# Patient Record
Sex: Female | Born: 1935
Health system: Southern US, Community
[De-identification: ages and names within clinical notes are randomized; demographics above are authoritative.]

## PROBLEM LIST (undated history)

## (undated) DIAGNOSIS — K219 Gastro-esophageal reflux disease without esophagitis: Secondary | ICD-10-CM

## (undated) DIAGNOSIS — E039 Hypothyroidism, unspecified: Secondary | ICD-10-CM

## (undated) DIAGNOSIS — M1990 Unspecified osteoarthritis, unspecified site: Secondary | ICD-10-CM

## (undated) DIAGNOSIS — I499 Cardiac arrhythmia, unspecified: Secondary | ICD-10-CM

## (undated) DIAGNOSIS — C801 Malignant (primary) neoplasm, unspecified: Secondary | ICD-10-CM

## (undated) HISTORY — PX: TOTAL HIP ARTHROPLASTY: SHX124

---

## 2004-10-06 ENCOUNTER — Ambulatory Visit: Payer: Self-pay | Admitting: Family Medicine

## 2004-10-13 ENCOUNTER — Ambulatory Visit: Payer: Self-pay | Admitting: Family Medicine

## 2004-10-15 ENCOUNTER — Ambulatory Visit: Payer: Self-pay | Admitting: Family Medicine

## 2004-10-29 ENCOUNTER — Ambulatory Visit: Payer: Self-pay | Admitting: Family Medicine

## 2005-01-06 ENCOUNTER — Ambulatory Visit: Payer: Self-pay | Admitting: Family Medicine

## 2005-02-24 ENCOUNTER — Ambulatory Visit: Payer: Self-pay | Admitting: Family Medicine

## 2005-07-06 ENCOUNTER — Ambulatory Visit: Payer: Self-pay | Admitting: Family Medicine

## 2005-10-06 ENCOUNTER — Ambulatory Visit: Payer: Self-pay | Admitting: Family Medicine

## 2006-02-10 ENCOUNTER — Ambulatory Visit: Payer: Self-pay | Admitting: Family Medicine

## 2006-06-02 ENCOUNTER — Ambulatory Visit: Payer: Self-pay | Admitting: Family Medicine

## 2006-06-14 ENCOUNTER — Ambulatory Visit: Payer: Self-pay | Admitting: Family Medicine

## 2006-10-08 ENCOUNTER — Ambulatory Visit: Payer: Self-pay | Admitting: Family Medicine

## 2009-04-05 ENCOUNTER — Encounter: Admission: RE | Admit: 2009-04-05 | Discharge: 2009-04-05 | Payer: Self-pay | Admitting: Family Medicine

## 2010-04-05 IMAGING — MG MM BREAST STEREO BIOPSY*L*
2 series · 2 of 2 positions shown · non-contrast
Comparison: none

Addendum Begins

Pathology results reveal microcalcifications in benign breast
lobules and stromal fibrosis in the left breast. This was found to
be concordant by Dr. Jovydas Agra. I spoke with the patient by
telephone to relay her pathology results. She stated that she had
done well after the biopsy without any bleeding, bruising, or
tenderness. Post biopsy instructions were given and the patient was
encouraged to call The [REDACTED] for any
questions or concerns. She was asked to return in 1 year for
screening mammography.
Pathology results were dictated by Thierry Martial Nih RN, BSN on Seraf[place]
Addendum Ends
CLINICAL DATA: Indeterminate microcalcifications left breast

[L CC]
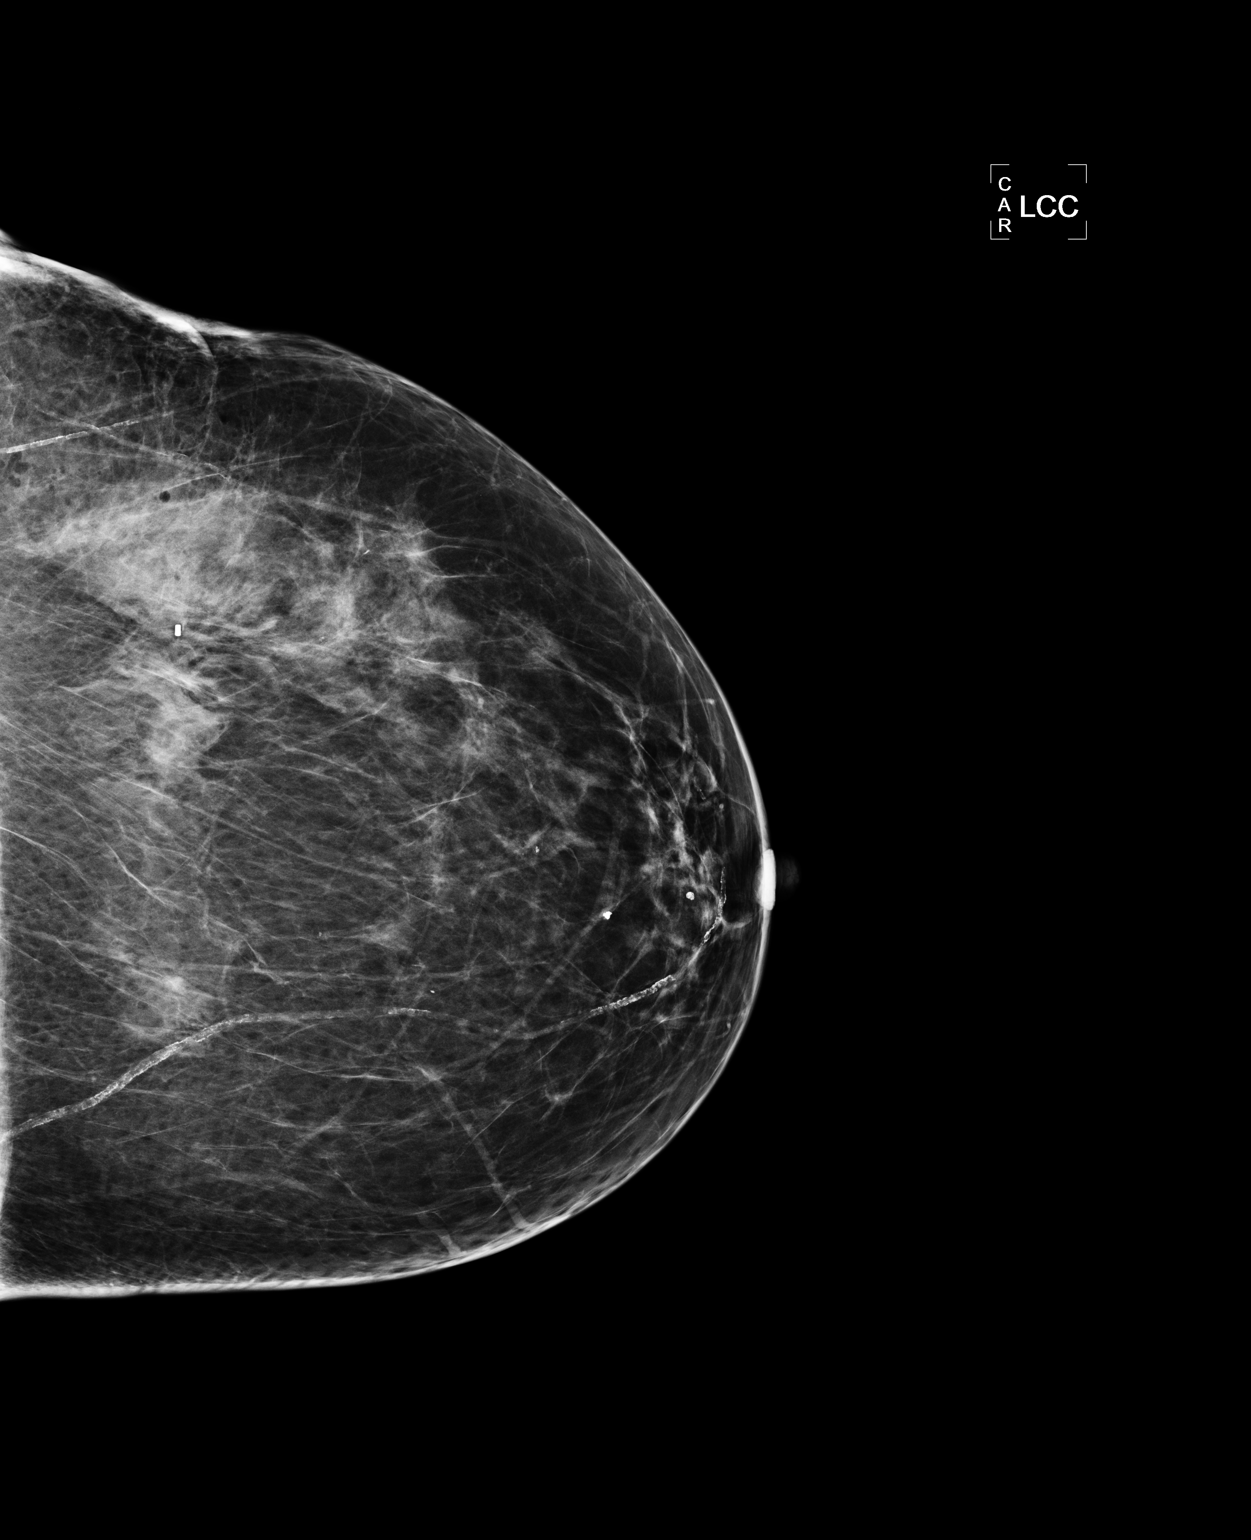

[L ML]
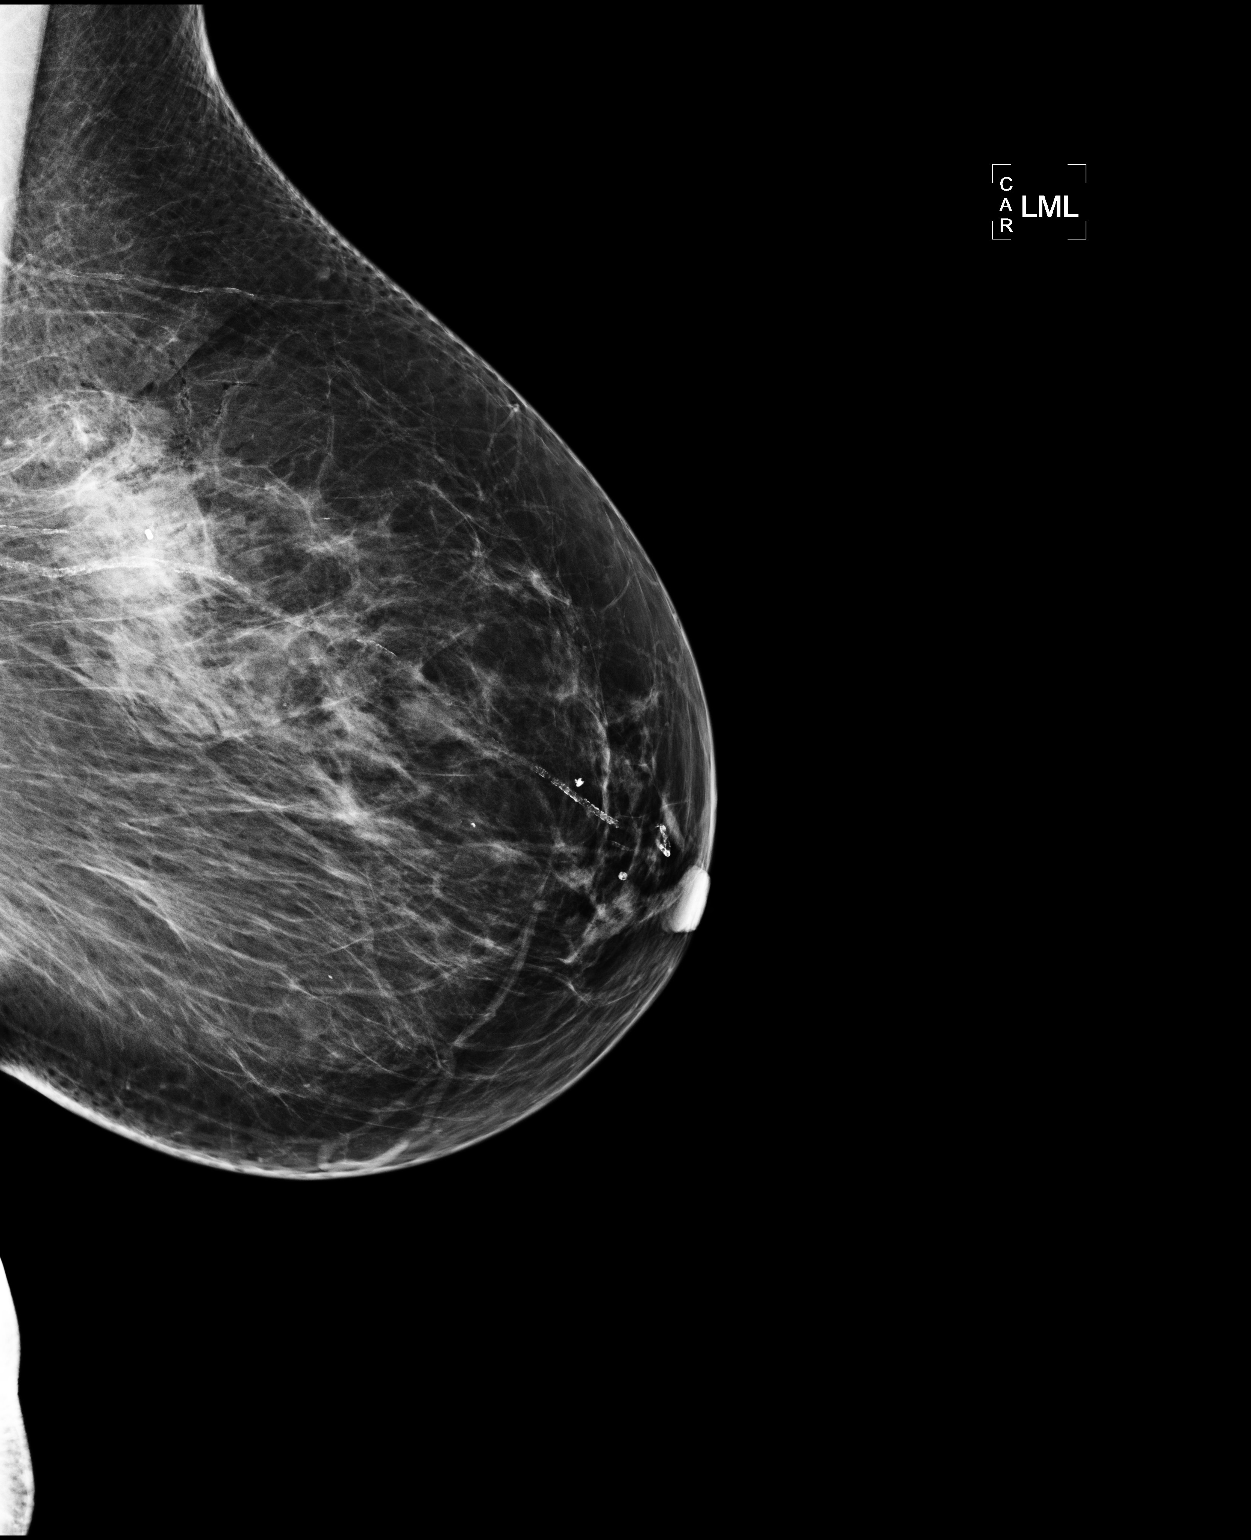

[2 of 2 positions shown; findings below may reference images not displayed]

STEREOTACTIC-GUIDED VACUUM ASSISTED BIOPSY OF THE LEFT BREAST AND
SPECIMEN RADIOGRAPH

I met with the patient, and we discussed the procedure of
stereotactic-guided biopsy, including risks, benefits, and
alternatives.  Specifically, we discussed the risks of infection,
bleeding, tissue injury, clip migration, and inadequate sampling.
Informed, written consent was given.

Using sterile technique, 2% lidocaine, stereotactic guidance, and a
9 gauge vacuum assisted device, biopsy was performed of
indeterminate microcalcifications in the upper outer quadrant left
breast.  Specimen radiograph was performed, showing inclusion of
microcalcifications of concern.  Specimens with calcifications are
identified for pathology.

At the conclusion of the procedure, a tissue marker clip was
deployed into the biopsy cavity.  Follow-up 2-view mammogram
confirmed clip in the area of concern.
IMPRESSION: Stereotactic-guided biopsy of left breast.  No apparent
complications.

## 2014-04-04 DIAGNOSIS — E039 Hypothyroidism, unspecified: Secondary | ICD-10-CM | POA: Insufficient documentation

## 2016-11-25 DIAGNOSIS — I10 Essential (primary) hypertension: Secondary | ICD-10-CM | POA: Insufficient documentation

## 2016-11-25 DIAGNOSIS — K219 Gastro-esophageal reflux disease without esophagitis: Secondary | ICD-10-CM | POA: Insufficient documentation

## 2016-11-25 DIAGNOSIS — D649 Anemia, unspecified: Secondary | ICD-10-CM | POA: Insufficient documentation

## 2017-06-17 NOTE — Patient Instructions (Addendum)
Your procedure is scheduled on: 06/28/2017   Report to [facility] at  44   AM.  Call this number if you have problems the morning of surgery: [phone]   Do not eat food or drink liquids :After Midnight.      Take these medicines the morning of surgery with A SIP OF WATER: levothyroxine, prilosec.   Do not wear jewelry, make-up or nail polish.  Do not wear lotions, powders, or perfumes. You may wear deodorant.  Do not shave 48 hours prior to surgery.  Do not bring valuables to the hospital.  Contacts, dentures or bridgework may not be worn into surgery.  Leave suitcase in the car. After surgery it may be brought to your room.  For patients admitted to the hospital, checkout time is 11:00 AM the day of discharge.   Patients discharged the day of surgery will not be allowed to drive home.  :     Please read over the following fact sheets that you were given: Coughing and Deep Breathing, Surgical Site Infection Prevention, Anesthesia Post-op Instructions and Care and Recovery After Surgery    Cataract A cataract is a clouding of the lens of the eye. When a lens becomes cloudy, vision is reduced based on the degree and nature of the clouding. Many cataracts reduce vision to some degree. Some cataracts make people more near-sighted as they develop. Other cataracts increase glare. Cataracts that are ignored and become worse can sometimes look white. The white color can be seen through the pupil. CAUSES   Aging. However, cataracts may occur at any age, even in newborns.   Certain drugs.   Trauma to the eye.   Certain diseases such as diabetes.   Specific eye diseases such as chronic inflammation inside the eye or a sudden attack of a rare form of glaucoma.   Inherited or acquired medical problems.  SYMPTOMS   Gradual, progressive drop in vision in the affected eye.   Severe, rapid visual loss. This most often happens when trauma is the cause.  DIAGNOSIS  To detect a cataract, an  eye doctor examines the lens. Cataracts are best diagnosed with an exam of the eyes with the pupils enlarged (dilated) by drops.  TREATMENT  For an early cataract, vision may improve by using different eyeglasses or stronger lighting. If that does not help your vision, surgery is the only effective treatment. A cataract needs to be surgically removed when vision loss interferes with your everyday activities, such as driving, reading, or watching TV. A cataract may also have to be removed if it prevents examination or treatment of another eye problem. Surgery removes the cloudy lens and usually replaces it with a substitute lens (intraocular lens, IOL).  At a time when both you and your doctor agree, the cataract will be surgically removed. If you have cataracts in both eyes, only one is usually removed at a time. This allows the operated eye to heal and be out of danger from any possible problems after surgery (such as infection or poor wound healing). In rare cases, a cataract may be doing damage to your eye. In these cases, your caregiver may advise surgical removal right away. The vast majority of people who have cataract surgery have better vision afterward. HOME CARE INSTRUCTIONS  If you are not planning surgery, you may be asked to do the following:  Use different eyeglasses.   Use stronger or brighter lighting.   Ask your eye doctor about reducing your  medicine dose or changing medicines if it is thought that a medicine caused your cataract. Changing medicines does not make the cataract go away on its own.   Become familiar with your surroundings. Poor vision can lead to injury. Avoid bumping into things on the affected side. You are at a higher risk for tripping or falling.   Exercise extreme care when driving or operating machinery.   Wear sunglasses if you are sensitive to bright light or experiencing problems with glare.  SEEK IMMEDIATE MEDICAL CARE IF:   You have a worsening or sudden  vision loss.   You notice redness, swelling, or increasing pain in the eye.   You have a fever.  Document Released: 08/03/2005 Document Revised: 07/23/2011 Document Reviewed: 03/27/2011 [facility] Patient Information 2012 [place].PATIENT INSTRUCTIONS POST-ANESTHESIA  IMMEDIATELY FOLLOWING SURGERY:  Do not drive or operate machinery for the first twenty four hours after surgery.  Do not make any important decisions for twenty four hours after surgery or while taking narcotic pain medications or sedatives.  If you develop intractable nausea and vomiting or a severe headache please notify your doctor immediately.  FOLLOW-UP:  Please make an appointment with your surgeon as instructed. You do not need to follow up with anesthesia unless specifically instructed to do so.  WOUND CARE INSTRUCTIONS (if applicable):  Keep a dry clean dressing on the anesthesia/puncture wound site if there is drainage.  Once the wound has quit draining you may leave it open to air.  Generally you should leave the bandage intact for twenty four hours unless there is drainage.  If the epidural site drains for more than 36-48 hours please call the anesthesia department.  QUESTIONS?:  Please feel free to call your physician or the hospital operator if you have any questions, and they will be happy to assist you.

## 2017-06-23 ENCOUNTER — Other Ambulatory Visit: Payer: Self-pay

## 2017-06-23 ENCOUNTER — Encounter (HOSPITAL_COMMUNITY): Payer: Self-pay

## 2017-06-23 ENCOUNTER — Encounter (HOSPITAL_COMMUNITY)
Admission: RE | Admit: 2017-06-23 | Discharge: 2017-06-23 | Disposition: A | Discharge status: Home / Self Care | Payer: Medicare Other | Source: Ambulatory Visit | Attending: Ophthalmology | Admitting: Ophthalmology

## 2017-06-23 DIAGNOSIS — E039 Hypothyroidism, unspecified: Secondary | ICD-10-CM | POA: Insufficient documentation

## 2017-06-23 DIAGNOSIS — R000 Tachycardia, unspecified: Secondary | ICD-10-CM | POA: Insufficient documentation

## 2017-06-23 DIAGNOSIS — I4891 Unspecified atrial fibrillation: Secondary | ICD-10-CM | POA: Diagnosis not present

## 2017-06-23 DIAGNOSIS — Z01812 Encounter for preprocedural laboratory examination: Secondary | ICD-10-CM | POA: Insufficient documentation

## 2017-06-23 DIAGNOSIS — K219 Gastro-esophageal reflux disease without esophagitis: Secondary | ICD-10-CM | POA: Insufficient documentation

## 2017-06-23 DIAGNOSIS — Z01810 Encounter for preprocedural cardiovascular examination: Secondary | ICD-10-CM | POA: Diagnosis not present

## 2017-06-23 HISTORY — DX: Unspecified osteoarthritis, unspecified site: M19.90

## 2017-06-23 HISTORY — DX: Hypothyroidism, unspecified: E03.9

## 2017-06-23 HISTORY — DX: Malignant (primary) neoplasm, unspecified: C80.1

## 2017-06-23 HISTORY — DX: Cardiac arrhythmia, unspecified: I49.9

## 2017-06-23 HISTORY — DX: Gastro-esophageal reflux disease without esophagitis: K21.9

## 2017-06-23 LAB — CBC WITH DIFFERENTIAL/PLATELET
Basophils Absolute: 0 10*3/uL (ref 0.0–0.1)
Basophils Relative: 1 %
Eosinophils Absolute: 0 10*3/uL (ref 0.0–0.7)
Eosinophils Relative: 1 %
HEMATOCRIT: 37.2 % (ref 36.0–46.0)
HEMOGLOBIN: 12.3 g/dL (ref 12.0–15.0)
LYMPHS ABS: 2 10*3/uL (ref 0.7–4.0)
LYMPHS PCT: 33 %
MCH: 29.4 pg (ref 26.0–34.0)
MCHC: 33.1 g/dL (ref 30.0–36.0)
MCV: 88.8 fL (ref 78.0–100.0)
MONOS PCT: 5 %
Monocytes Absolute: 0.3 10*3/uL (ref 0.1–1.0)
NEUTROS ABS: 3.7 10*3/uL (ref 1.7–7.7)
NEUTROS PCT: 60 %
Platelets: 187 10*3/uL (ref 150–400)
RBC: 4.19 MIL/uL (ref 3.87–5.11)
RDW: 15 % (ref 11.5–15.5)
WBC: 6.1 10*3/uL (ref 4.0–10.5)

## 2017-06-23 LAB — BASIC METABOLIC PANEL
Anion gap: 10 (ref 5–15)
BUN: 15 mg/dL (ref 6–20)
CHLORIDE: 104 mmol/L (ref 101–111)
CO2: 23 mmol/L (ref 22–32)
CREATININE: 0.92 mg/dL (ref 0.44–1.00)
Calcium: 8.6 mg/dL — ABNORMAL LOW (ref 8.9–10.3)
GFR calc Af Amer: >60 mL/min (ref >60)
GFR calc non Af Amer: 54 mL/min — ABNORMAL LOW (ref >60)
Glucose, Bld: 120 mg/dL — ABNORMAL HIGH (ref 65–99)
POTASSIUM: 3.7 mmol/L (ref 3.5–5.1)
Sodium: 137 mmol/L (ref 135–145)

## 2017-06-28 ENCOUNTER — Ambulatory Visit (HOSPITAL_COMMUNITY): Payer: Medicare Other | Admitting: Anesthesiology

## 2017-06-28 ENCOUNTER — Ambulatory Visit (HOSPITAL_COMMUNITY)
Admission: RE | Admit: 2017-06-28 | Discharge: 2017-06-28 | Disposition: A | Discharge status: Home / Self Care | Payer: Medicare Other | Source: Ambulatory Visit | Attending: Ophthalmology | Admitting: Ophthalmology

## 2017-06-28 ENCOUNTER — Encounter (HOSPITAL_COMMUNITY)
Admission: RE | Disposition: A | Discharge status: Home / Self Care | Payer: Self-pay | Source: Ambulatory Visit | Attending: Ophthalmology

## 2017-06-28 ENCOUNTER — Encounter (HOSPITAL_COMMUNITY): Payer: Self-pay | Admitting: Ophthalmology

## 2017-06-28 DIAGNOSIS — I4891 Unspecified atrial fibrillation: Secondary | ICD-10-CM | POA: Insufficient documentation

## 2017-06-28 DIAGNOSIS — Z79899 Other long term (current) drug therapy: Secondary | ICD-10-CM | POA: Insufficient documentation

## 2017-06-28 DIAGNOSIS — E039 Hypothyroidism, unspecified: Secondary | ICD-10-CM | POA: Diagnosis not present

## 2017-06-28 DIAGNOSIS — Z79890 Hormone replacement therapy (postmenopausal): Secondary | ICD-10-CM | POA: Diagnosis not present

## 2017-06-28 DIAGNOSIS — Z7982 Long term (current) use of aspirin: Secondary | ICD-10-CM | POA: Diagnosis not present

## 2017-06-28 DIAGNOSIS — H2512 Age-related nuclear cataract, left eye: Secondary | ICD-10-CM | POA: Insufficient documentation

## 2017-06-28 DIAGNOSIS — K219 Gastro-esophageal reflux disease without esophagitis: Secondary | ICD-10-CM | POA: Insufficient documentation

## 2017-06-28 HISTORY — PX: CATARACT EXTRACTION W/PHACO: SHX586

## 2017-06-28 SURGERY — PHACOEMULSIFICATION, CATARACT, WITH IOL INSERTION
Anesthesia: Monitor Anesthesia Care | Site: Eye | Laterality: Left

## 2017-06-28 MED ORDER — LIDOCAINE HCL (PF) 1 % IJ SOLN
INTRAMUSCULAR | Status: DC | PRN
  Administered 2017-06-28: .6 mL

## 2017-06-28 MED ORDER — PHENYLEPHRINE HCL 2.5 % OP SOLN
1.0000 [drp] | OPHTHALMIC | Status: AC
  Administered 2017-06-28 (×3): 1 [drp] via OPHTHALMIC

## 2017-06-28 MED ORDER — TETRACAINE HCL 0.5 % OP SOLN
1.0000 [drp] | OPHTHALMIC | Status: AC
  Administered 2017-06-28 (×3): 1 [drp] via OPHTHALMIC

## 2017-06-28 MED ORDER — PROVISC 10 MG/ML IO SOLN
INTRAOCULAR | Status: DC | PRN
  Administered 2017-06-28: 0.85 mL via INTRAOCULAR

## 2017-06-28 MED ORDER — LACTATED RINGERS IV SOLN
INTRAVENOUS | Status: DC
Start: 2017-06-28 — End: 2017-06-30
  Administered 2017-06-28: 1000 mL via INTRAVENOUS

## 2017-06-28 MED ORDER — NEOMYCIN-POLYMYXIN-DEXAMETH 3.5-10000-0.1 OP SUSP
OPHTHALMIC | Status: DC | PRN
  Administered 2017-06-28: 2 [drp] via OPHTHALMIC

## 2017-06-28 MED ORDER — LIDOCAINE HCL 3.5 % OP GEL
1.0000 "application " | Freq: Once | OPHTHALMIC | Status: AC
  Administered 2017-06-28: 1 via OPHTHALMIC

## 2017-06-28 MED ORDER — METOPROLOL TARTRATE 5 MG/5ML IV SOLN
INTRAVENOUS | Status: AC
  Filled 2017-06-28: qty 5

## 2017-06-28 MED ORDER — MIDAZOLAM HCL 2 MG/2ML IJ SOLN
INTRAMUSCULAR | Status: AC
  Filled 2017-06-28: qty 2

## 2017-06-28 MED ORDER — EPINEPHRINE PF 1 MG/ML IJ SOLN
INTRAOCULAR | Status: DC | PRN
  Administered 2017-06-28: 500 mL

## 2017-06-28 MED ORDER — POVIDONE-IODINE 5 % OP SOLN
OPHTHALMIC | Status: DC | PRN
  Administered 2017-06-28: 1 via OPHTHALMIC

## 2017-06-28 MED ORDER — CYCLOPENTOLATE-PHENYLEPHRINE 0.2-1 % OP SOLN
1.0000 [drp] | OPHTHALMIC | Status: AC
  Administered 2017-06-28 (×3): 1 [drp] via OPHTHALMIC

## 2017-06-28 MED ORDER — BSS IO SOLN
INTRAOCULAR | Status: DC | PRN
  Administered 2017-06-28: 15 mL

## 2017-06-28 MED ORDER — METOPROLOL TARTRATE 5 MG/5ML IV SOLN
5.0000 mg | Freq: Once | INTRAVENOUS | Status: AC
  Administered 2017-06-28: 5 mg via INTRAVENOUS

## 2017-06-28 MED ORDER — MIDAZOLAM HCL 2 MG/2ML IJ SOLN
1.0000 mg | INTRAMUSCULAR | Status: AC
  Administered 2017-06-28: 1 mg via INTRAVENOUS

## 2017-06-28 SURGICAL SUPPLY — 10 items
CLOTH BEACON ORANGE TIMEOUT ST (SAFETY) ×1 IMPLANT
EYE SHIELD UNIVERSAL CLEAR (GAUZE/BANDAGES/DRESSINGS) ×1 IMPLANT
GLOVE BIOGEL PI IND STRL 7.0 (GLOVE) IMPLANT
GLOVE BIOGEL PI INDICATOR 7.0 (GLOVE) ×1
LENS ALC ACRYL/TECN (Ophthalmic Related) ×1 IMPLANT
PAD ARMBOARD 7.5X6 YLW CONV (MISCELLANEOUS) ×1 IMPLANT
SYRINGE LUER LOK 1CC (MISCELLANEOUS) ×1 IMPLANT
TAPE SURG TRANSPORE 1 IN (GAUZE/BANDAGES/DRESSINGS) IMPLANT
TAPE SURGICAL TRANSPORE 1 IN (GAUZE/BANDAGES/DRESSINGS) ×1
WATER STERILE IRR 250ML POUR (IV SOLUTION) ×1 IMPLANT

## 2017-06-28 NOTE — Transfer of Care (Signed)
Immediate Anesthesia Transfer of Care Note  Patient: Elaine Kennedy  Procedure(s) Performed: CATARACT EXTRACTION PHACO AND INTRAOCULAR LENS PLACEMENT (IOC) (Left Eye)  Patient Location: Short Stay  Anesthesia Type:MAC  Level of Consciousness: awake and patient cooperative  Airway & Oxygen Therapy: Patient Spontanous Breathing  Post-op Assessment: Report given to RN, Post -op Vital signs reviewed and stable and Patient moving all extremities  Post vital signs: Reviewed and stable  Last Vitals:  Vitals:   06/28/17 0710 06/28/17 0715  BP: (!) 142/85 133/81  Resp: 12 16  Temp:    SpO2: 100% 100%    Last Pain: There were no vitals filed for this visit.    Patients Stated Pain Goal: 0 (73/53/29 [id number])  Complications: No apparent anesthesia complications

## 2017-06-28 NOTE — Anesthesia Preprocedure Evaluation (Signed)
Anesthesia Evaluation  Patient identified by MRN, date of birth, ID band Patient awake  General Assessment Comment:Slow mentation  Reviewed: Allergy & Precautions, NPO status , Patient's Chart, lab work & pertinent test results  Airway Mallampati: IV  TM Distance: >3 FB Neck ROM: Limited  Mouth opening: Limited Mouth Opening  Dental  (+) Edentulous Upper, Edentulous Lower   Pulmonary neg pulmonary ROS,    breath sounds clear to auscultation       Cardiovascular + dysrhythmias ( poorly controlled VR) Atrial Fibrillation  Rhythm:Irregular Rate:Tachycardia     Neuro/Psych negative neurological ROS     GI/Hepatic Neg liver ROS, GERD  ,  Endo/Other  negative endocrine ROSHypothyroidism   Renal/GU negative Renal ROS     Musculoskeletal   Abdominal   Peds  Hematology negative hematology ROS (+)   Anesthesia Other Findings   Reproductive/Obstetrics                             Anesthesia Physical Anesthesia Plan  ASA: III  Anesthesia Plan: MAC   Post-op Pain Management:    Induction: Intravenous  PONV Risk Score and Plan:   Airway Management Planned: Nasal Cannula  Additional Equipment:   Intra-op Plan:   Post-operative Plan:   Informed Consent: I have reviewed the patients History and Physical, chart, labs and discussed the procedure including the risks, benefits and alternatives for the proposed anesthesia with the patient or authorized representative who has indicated his/her understanding and acceptance.     Plan Discussed with:   Anesthesia Plan Comments: (Control VR with lopressor)        Anesthesia Quick Evaluation

## 2017-06-28 NOTE — Discharge Instructions (Signed)
Cataract Surgery, Care After Refer to this sheet in the next few weeks. These instructions provide you with information about caring for yourself after your procedure. Your health care provider may also give you more specific instructions. Your treatment has been planned according to current medical practices, but problems sometimes occur. Call your health care provider if you have any problems or questions after your procedure. What can I expect after the procedure? After the procedure, it is common to have:  Itching.  Discomfort.  Fluid discharge.  Sensitivity to light and to touch.  Bruising.  Follow these instructions at home: Sells your eye every day for signs of infection. Watch for: ? Redness, swelling, or pain. ? Fluid, blood, or pus. ? Warmth. ? Bad smell. Activity  Avoid strenuous activities, such as playing contact sports, for as long as told by your health care provider.  Do not drive or operate heavy machinery until your health care provider approves.  Do not bend or lift heavy objects. Bending increases pressure in the eye. You can walk, climb stairs, and do light household chores.  Ask your health care provider when you can return to work. If you work in a dusty environment, you may be advised to wear protective eyewear for a period of time. General instructions  Take or apply over-the-counter and prescription medicines only as told by your health care provider. This includes eye drops.  Do not touch or rub your eyes.  If you were given a protective shield, wear it as told by your health care provider. If you were not given a protective shield, wear sunglasses as told by your health care provider to protect your eyes.  Keep the area around your eye clean and dry. Avoid swimming or allowing water to hit you directly in the face while showering until told by your health care provider. Keep soap and shampoo out of your eyes.  Do not put a contact lens  into the affected eye or eyes until your health care provider approves.  Keep all follow-up visits as told by your health care provider. This is important. Contact a health care provider if:   You have increased bruising around your eye.  You have pain that is not helped with medicine.  You have a fever.  You have redness, swelling, or pain in your eye.  You have fluid, blood, or pus coming from your incision.  Your vision gets worse. Get help right away if:  You have sudden vision loss. This information is not intended to replace advice given to you by your health care provider. Make sure you discuss any questions you have with your health care provider. Document Released: 02/20/2005 Document Revised: 12/12/2015 Document Reviewed: 06/13/2015 Elsevier Interactive Patient Education  2017 [employer] PATIENT INSTRUCTIONS POST-ANESTHESIA  IMMEDIATELY FOLLOWING SURGERY:  Do not drive or operate machinery for the first twenty four hours after surgery.  Do not make any important decisions for twenty four hours after surgery or while taking narcotic pain medications or sedatives.  If you develop intractable nausea and vomiting or a severe headache please notify your doctor immediately.  FOLLOW-UP:  Please make an appointment with your surgeon as instructed. You do not need to follow up with anesthesia unless specifically instructed to do so.  WOUND CARE INSTRUCTIONS (if applicable):  Keep a dry clean dressing on the anesthesia/puncture wound site if there is drainage.  Once the wound has quit draining you may leave it open to air.  Generally you should leave the bandage intact for twenty four hours unless there is drainage.  If the epidural site drains for more than 36-48 hours please call the anesthesia department.  QUESTIONS?:  Please feel free to call your physician or the hospital operator if you have any questions, and they will be happy to assist you.

## 2017-06-28 NOTE — Anesthesia Postprocedure Evaluation (Signed)
Anesthesia Post Note  Patient: Elaine Kennedy  Procedure(s) Performed: CATARACT EXTRACTION PHACO AND INTRAOCULAR LENS PLACEMENT (IOC) (Left Eye)  Patient location during evaluation: Short Stay Anesthesia Type: MAC Level of consciousness: awake and patient cooperative Pain management: pain level controlled Vital Signs Assessment: post-procedure vital signs reviewed and stable Respiratory status: spontaneous breathing, nonlabored ventilation and respiratory function stable Cardiovascular status: blood pressure returned to baseline Postop Assessment: no apparent nausea or vomiting Anesthetic complications: no     Last Vitals:  Vitals:   06/28/17 0710 06/28/17 0715  BP: (!) 142/85 133/81  Resp: 12 16  Temp:    SpO2: 100% 100%    Last Pain: There were no vitals filed for this visit.               Kejon Feild J

## 2017-06-28 NOTE — Op Note (Signed)
Date of Admission: 06/28/2017  Date of Surgery: 06/28/2017  Pre-Op Dx: Cataract Left  Eye  Post-Op Dx: Senile Nuclear Cataract  Left  Eye,  Dx Code H25.12  Surgeon: Tonny Branch, M.D.  Assistants: None  Anesthesia: Topical with MAC  Indications: Painless, progressive loss of vision with compromise of daily activities.  Surgery: Cataract Extraction with Intraocular lens Implant Left Eye  Discription: The patient had dilating drops and viscous lidocaine placed into the Left eye in the pre-op holding area. After transfer to the operating room, a time out was performed. The patient was then prepped and draped. Beginning with a 83m blade a paracentesis port was made at the surgeon's 2 o'clock position. The anterior chamber was then filled with 1% non-preserved lidocaine. This was followed by filling the anterior chamber with Provisc.  A 2.428mkeratome blade was used to make a clear corneal incision at the temporal limbus.  A bent cystatome needle was used to create a continuous tear capsulotomy. Hydrodissection was performed with balanced salt solution on a Fine canula. The lens nucleus was then removed using the phacoemulsification handpiece. Residual cortex was removed with the I&A handpiece. The anterior chamber and capsular bag were refilled with Provisc. A posterior chamber intraocular lens was placed into the capsular bag with it's injector. The implant was positioned with the Kuglan hook. The Provisc was then removed from the anterior chamber and capsular bag with the I&A handpiece. Stromal hydration of the main incision and paracentesis port was performed with BSS on a Fine canula. The wounds were tested for leak which was negative. The patient tolerated the procedure well. There were no operative complications. The patient was then transferred to the recovery room in stable condition.  Complications: None  Specimen: None  EBL: None  Prosthetic device: Abbott Technis, PCB00, power 25.0, SN  [id number]

## 2017-06-28 NOTE — H&P (Signed)
I have reviewed the H&P, the patient was re-examined, and I have identified no interval changes in medical condition and plan of care since the history and physical of record  

## 2017-06-29 ENCOUNTER — Encounter (HOSPITAL_COMMUNITY): Payer: Self-pay | Admitting: Ophthalmology

## 2018-12-06 ENCOUNTER — Other Ambulatory Visit (HOSPITAL_COMMUNITY): Payer: Medicare Other

## 2018-12-12 ENCOUNTER — Ambulatory Visit: Admit: 2018-12-12 | Payer: Medicare Other | Admitting: Ophthalmology

## 2018-12-12 SURGERY — PHACOEMULSIFICATION, CATARACT, WITH IOL INSERTION
Anesthesia: Monitor Anesthesia Care | Laterality: Right

## 2019-02-15 DIAGNOSIS — E1169 Type 2 diabetes mellitus with other specified complication: Secondary | ICD-10-CM | POA: Diagnosis not present

## 2019-02-15 DIAGNOSIS — E785 Hyperlipidemia, unspecified: Secondary | ICD-10-CM | POA: Diagnosis not present

## 2019-02-15 DIAGNOSIS — Z7982 Long term (current) use of aspirin: Secondary | ICD-10-CM | POA: Diagnosis not present

## 2019-02-15 DIAGNOSIS — E039 Hypothyroidism, unspecified: Secondary | ICD-10-CM | POA: Diagnosis not present

## 2019-02-15 DIAGNOSIS — Z4800 Encounter for change or removal of nonsurgical wound dressing: Secondary | ICD-10-CM | POA: Diagnosis not present

## 2019-02-15 DIAGNOSIS — K219 Gastro-esophageal reflux disease without esophagitis: Secondary | ICD-10-CM | POA: Diagnosis not present

## 2019-02-15 DIAGNOSIS — L03115 Cellulitis of right lower limb: Secondary | ICD-10-CM | POA: Diagnosis not present

## 2019-02-15 DIAGNOSIS — I152 Hypertension secondary to endocrine disorders: Secondary | ICD-10-CM | POA: Diagnosis not present

## 2019-02-15 DIAGNOSIS — L97811 Non-pressure chronic ulcer of other part of right lower leg limited to breakdown of skin: Secondary | ICD-10-CM | POA: Diagnosis not present

## 2019-02-16 ENCOUNTER — Other Ambulatory Visit: Payer: Self-pay

## 2019-02-17 ENCOUNTER — Other Ambulatory Visit: Payer: Self-pay

## 2019-02-20 ENCOUNTER — Ambulatory Visit (INDEPENDENT_AMBULATORY_CARE_PROVIDER_SITE_OTHER): Payer: Medicare Other | Admitting: Physician Assistant

## 2019-02-20 ENCOUNTER — Encounter: Payer: Self-pay | Admitting: Physician Assistant

## 2019-02-20 ENCOUNTER — Other Ambulatory Visit: Payer: Self-pay

## 2019-02-20 ENCOUNTER — Other Ambulatory Visit: Payer: Self-pay | Admitting: Physician Assistant

## 2019-02-20 VITALS — BP 156/85 | HR 105 | Temp 98.7°F

## 2019-02-20 DIAGNOSIS — E559 Vitamin D deficiency, unspecified: Secondary | ICD-10-CM

## 2019-02-20 DIAGNOSIS — Z8679 Personal history of other diseases of the circulatory system: Secondary | ICD-10-CM | POA: Diagnosis not present

## 2019-02-20 DIAGNOSIS — Z8669 Personal history of other diseases of the nervous system and sense organs: Secondary | ICD-10-CM

## 2019-02-20 DIAGNOSIS — Z862 Personal history of diseases of the blood and blood-forming organs and certain disorders involving the immune mechanism: Secondary | ICD-10-CM | POA: Diagnosis not present

## 2019-02-20 DIAGNOSIS — M150 Primary generalized (osteo)arthritis: Secondary | ICD-10-CM | POA: Diagnosis not present

## 2019-02-20 DIAGNOSIS — M159 Polyosteoarthritis, unspecified: Secondary | ICD-10-CM

## 2019-02-20 MED ORDER — DICLOFENAC SODIUM 1 % TD GEL: 4.0000 g | Freq: Four times a day (QID) | TRANSDERMAL | 11 refills | Status: DC

## 2019-02-20 MED ORDER — METOPROLOL SUCCINATE ER 50 MG PO TB24: 50.0000 mg | ORAL_TABLET | Freq: Every day | ORAL | 5 refills | Status: DC

## 2019-02-21 ENCOUNTER — Encounter (HOSPITAL_COMMUNITY)
Admission: RE | Admit: 2019-02-21 | Discharge: 2019-02-21 | Disposition: A | Discharge status: Home / Self Care | Payer: Medicare Other | Source: Ambulatory Visit | Attending: Ophthalmology | Admitting: Ophthalmology

## 2019-02-23 ENCOUNTER — Other Ambulatory Visit: Payer: Self-pay

## 2019-02-23 ENCOUNTER — Other Ambulatory Visit (HOSPITAL_COMMUNITY)
Admission: RE | Admit: 2019-02-23 | Discharge: 2019-02-23 | Disposition: A | Discharge status: Home / Self Care | Payer: Medicare Other | Source: Ambulatory Visit | Attending: Ophthalmology | Admitting: Ophthalmology

## 2019-02-23 DIAGNOSIS — Z1159 Encounter for screening for other viral diseases: Secondary | ICD-10-CM | POA: Diagnosis not present

## 2019-02-23 DIAGNOSIS — Z01812 Encounter for preprocedural laboratory examination: Secondary | ICD-10-CM | POA: Diagnosis not present

## 2019-02-24 LAB — SARS CORONAVIRUS 2 (TAT 6-24 HRS): SARS Coronavirus 2: NEGATIVE

## 2019-02-24 MED ORDER — PHENYLEPHRINE HCL 2.5 % OP SOLN
OPHTHALMIC | Status: AC
  Filled 2019-02-24: qty 15

## 2019-02-24 MED ORDER — TETRACAINE HCL 0.5 % OP SOLN
OPHTHALMIC | Status: AC
  Filled 2019-02-24: qty 4

## 2019-02-24 MED ORDER — CYCLOPENTOLATE-PHENYLEPHRINE 0.2-1 % OP SOLN
OPHTHALMIC | Status: AC
  Filled 2019-02-24: qty 2

## 2019-02-24 MED ORDER — LIDOCAINE HCL (PF) 1 % IJ SOLN
INTRAMUSCULAR | Status: AC
  Filled 2019-02-24: qty 2

## 2019-02-24 MED ORDER — NEOMYCIN-POLYMYXIN-DEXAMETH 3.5-10000-0.1 OP SUSP
OPHTHALMIC | Status: AC
  Filled 2019-02-24: qty 5

## 2019-02-24 MED ORDER — LIDOCAINE HCL 3.5 % OP GEL
OPHTHALMIC | Status: AC
Start: 2019-02-24 — End: ?
  Filled 2019-02-24: qty 1

## 2019-02-27 ENCOUNTER — Encounter (HOSPITAL_COMMUNITY): Payer: Self-pay | Admitting: Anesthesiology

## 2019-02-27 ENCOUNTER — Encounter (HOSPITAL_COMMUNITY)
Admission: RE | Disposition: A | Discharge status: Home / Self Care | Payer: Self-pay | Source: Home / Self Care | Attending: Ophthalmology

## 2019-02-27 ENCOUNTER — Ambulatory Visit (HOSPITAL_COMMUNITY): Payer: Medicare Other | Admitting: Anesthesiology

## 2019-02-27 ENCOUNTER — Ambulatory Visit (HOSPITAL_COMMUNITY)
Admission: RE | Admit: 2019-02-27 | Discharge: 2019-02-27 | Disposition: A | Discharge status: Home / Self Care | Payer: Medicare Other | Attending: Ophthalmology | Admitting: Ophthalmology

## 2019-02-27 ENCOUNTER — Other Ambulatory Visit: Payer: Self-pay

## 2019-02-27 ENCOUNTER — Other Ambulatory Visit (HOSPITAL_COMMUNITY): Payer: Medicare Other

## 2019-02-27 DIAGNOSIS — K219 Gastro-esophageal reflux disease without esophagitis: Secondary | ICD-10-CM | POA: Insufficient documentation

## 2019-02-27 DIAGNOSIS — Z7982 Long term (current) use of aspirin: Secondary | ICD-10-CM | POA: Insufficient documentation

## 2019-02-27 DIAGNOSIS — M159 Polyosteoarthritis, unspecified: Secondary | ICD-10-CM | POA: Insufficient documentation

## 2019-02-27 DIAGNOSIS — Z8669 Personal history of other diseases of the nervous system and sense organs: Secondary | ICD-10-CM | POA: Insufficient documentation

## 2019-02-27 DIAGNOSIS — I4891 Unspecified atrial fibrillation: Secondary | ICD-10-CM | POA: Diagnosis not present

## 2019-02-27 DIAGNOSIS — Z8679 Personal history of other diseases of the circulatory system: Secondary | ICD-10-CM | POA: Insufficient documentation

## 2019-02-27 DIAGNOSIS — E559 Vitamin D deficiency, unspecified: Secondary | ICD-10-CM | POA: Insufficient documentation

## 2019-02-27 DIAGNOSIS — F0390 Unspecified dementia without behavioral disturbance: Secondary | ICD-10-CM | POA: Diagnosis not present

## 2019-02-27 DIAGNOSIS — H259 Unspecified age-related cataract: Secondary | ICD-10-CM | POA: Insufficient documentation

## 2019-02-27 DIAGNOSIS — Z862 Personal history of diseases of the blood and blood-forming organs and certain disorders involving the immune mechanism: Secondary | ICD-10-CM | POA: Insufficient documentation

## 2019-02-27 DIAGNOSIS — E039 Hypothyroidism, unspecified: Secondary | ICD-10-CM | POA: Diagnosis not present

## 2019-02-27 DIAGNOSIS — H25811 Combined forms of age-related cataract, right eye: Secondary | ICD-10-CM | POA: Diagnosis not present

## 2019-02-27 DIAGNOSIS — Z79890 Hormone replacement therapy (postmenopausal): Secondary | ICD-10-CM | POA: Diagnosis not present

## 2019-02-27 DIAGNOSIS — H2511 Age-related nuclear cataract, right eye: Secondary | ICD-10-CM | POA: Diagnosis not present

## 2019-02-27 HISTORY — PX: CATARACT EXTRACTION W/PHACO: SHX586

## 2019-02-27 SURGERY — PHACOEMULSIFICATION, CATARACT, WITH IOL INSERTION
Anesthesia: Monitor Anesthesia Care | Site: Eye | Laterality: Right

## 2019-02-27 MED ORDER — LACTATED RINGERS IV SOLN: INTRAVENOUS | Status: DC

## 2019-02-27 MED ORDER — SODIUM HYALURONATE 23 MG/ML IO SOLN
INTRAOCULAR | Status: DC | PRN
  Administered 2019-02-27: 0.6 mL via INTRAOCULAR

## 2019-02-27 MED ORDER — LIDOCAINE HCL 3.5 % OP GEL
1.0000 "application " | Freq: Once | OPHTHALMIC | Status: AC
  Administered 2019-02-27: 1 via OPHTHALMIC

## 2019-02-27 MED ORDER — TETRACAINE HCL 0.5 % OP SOLN
1.0000 [drp] | OPHTHALMIC | Status: AC
  Administered 2019-02-27 (×3): 1 [drp] via OPHTHALMIC

## 2019-02-27 MED ORDER — LIDOCAINE HCL (PF) 1 % IJ SOLN
INTRAMUSCULAR | Status: AC
  Filled 2019-02-27: qty 2

## 2019-02-27 MED ORDER — PHENYLEPHRINE HCL 2.5 % OP SOLN
1.0000 [drp] | OPHTHALMIC | Status: AC
  Administered 2019-02-27 (×3): 1 [drp] via OPHTHALMIC

## 2019-02-27 MED ORDER — CYCLOPENTOLATE-PHENYLEPHRINE 0.2-1 % OP SOLN
1.0000 [drp] | OPHTHALMIC | Status: AC
  Administered 2019-02-27 (×3): 1 [drp] via OPHTHALMIC

## 2019-02-27 MED ORDER — NEOMYCIN-POLYMYXIN-DEXAMETH 3.5-10000-0.1 OP SUSP
OPHTHALMIC | Status: AC
  Filled 2019-02-27: qty 5

## 2019-02-27 MED ORDER — POVIDONE-IODINE 5 % OP SOLN
OPHTHALMIC | Status: DC | PRN
  Administered 2019-02-27: 1 via OPHTHALMIC

## 2019-02-27 MED ORDER — EPINEPHRINE PF 1 MG/ML IJ SOLN
INTRAOCULAR | Status: DC | PRN
  Administered 2019-02-27: 09:00:00 500 mL

## 2019-02-27 MED ORDER — TETRACAINE HCL 0.5 % OP SOLN
OPHTHALMIC | Status: AC
  Filled 2019-02-27: qty 4

## 2019-02-27 MED ORDER — BSS IO SOLN
INTRAOCULAR | Status: DC | PRN
  Administered 2019-02-27: 15 mL

## 2019-02-27 MED ORDER — PROVISC 10 MG/ML IO SOLN
INTRAOCULAR | Status: DC | PRN
  Administered 2019-02-27: 0.85 mL via INTRAOCULAR

## 2019-02-27 MED ORDER — LIDOCAINE HCL 3.5 % OP GEL
OPHTHALMIC | Status: AC
  Filled 2019-02-27: qty 1

## 2019-02-27 MED ORDER — LIDOCAINE HCL (PF) 1 % IJ SOLN
INTRAOCULAR | Status: DC | PRN
  Administered 2019-02-27: 1 mL via OPHTHALMIC

## 2019-02-27 MED ORDER — NEOMYCIN-POLYMYXIN-DEXAMETH 3.5-10000-0.1 OP SUSP
OPHTHALMIC | Status: DC | PRN
  Administered 2019-02-27: 2 [drp] via OPHTHALMIC

## 2019-02-27 SURGICAL SUPPLY — 15 items

## 2019-02-27 NOTE — H&P (Signed)
The H and P was reviewed and updated. The patient was examined.  No changes were found after exam.  The surgical eye was marked.  

## 2019-02-27 NOTE — Progress Notes (Signed)
BP (!) 156/85   Pulse (!) 105   Temp 98.7 F (37.1 C) (Oral)    Subjective:    Patient ID: Elaine Kennedy, female    DOB: 1927-03-04, 85 y.o.   MRN: [id number]  HPI: Elaine Kennedy is a 85 y.o. female presenting on 02/20/2019 for [facility] comes in today to be established as a new patient.  She has been a longtime patient of Dr. Edrick Oh.  She is accompanied by her daughter.  She does have long-term issues of GERD, arthritis, hypothyroidism, skin cancer.  She does follow with her dermatologist on a regular basis.  It is Dr. Allyn Kenner.  The patient has had advanced home health at her house over the years to help with care, mobility.  Most recently she has had venous stasis ulcer on her right leg.  Wound care is involved through home health.  They will contact us for further orders that she is moving to our office.  In the past she has had some issues with atrial fibrillation, hypertension and tachycardia.  She does well on the metoprolol.  Because she is a high risk for falls she is not able to be anticoagulated anymore.  Her past surgical history was positive for cataract surgery and hip replacement  Past Medical History:  Diagnosis Date  . Arthritis   . Cancer ([place])    skin cancer on head.  [name] Dysrhythmia    AFib  . GERD (gastroesophageal reflux disease)   . Hypothyroidism    Relevant past medical, surgical, family and social history reviewed and updated as indicated. Interim medical history since our last visit reviewed. Allergies and medications reviewed and updated. DATA REVIEWED: CHART IN EPIC  Family History reviewed for pertinent findings.  Review of Systems  Constitutional: Negative.  Negative for activity change, fatigue and fever.  HENT: Negative.   Eyes: Negative.   Respiratory: Negative.  Negative for cough.   Cardiovascular: Positive for leg swelling. Negative for chest pain.  Gastrointestinal: Negative.  Negative for abdominal pain.   Endocrine: Negative.   Genitourinary: Negative.  Negative for dysuria.  Musculoskeletal: Positive for arthralgias.  Skin: Positive for wound.  Neurological: Positive for weakness.  Psychiatric/Behavioral: Negative.     Allergies as of 02/20/2019      Reactions   Sulfa Antibiotics Diarrhea      Medication List       Accurate as of February 20, 2019 11:59 PM. If you have any questions, ask your nurse or doctor.        acetaminophen 650 MG CR tablet Commonly known as: TYLENOL Take 650 mg by mouth every 8 (eight) hours as needed for pain.   aspirin EC 81 MG tablet Take 81 mg by mouth daily.   BLINK TEARS OP Place 1 drop into both eyes 3 (three) times daily as needed (dry eyes).   cholecalciferol 1000 units tablet Commonly known as: VITAMIN D Take 1,000 Units by mouth daily.   diclofenac sodium 1 % Gel Commonly known as: VOLTAREN Apply 4 g topically 4 (four) times daily. FOR KNEE arthritis Started by: Terald Sleeper, PA-C   ferrous sulfate 325 (65 FE) MG tablet Take 325 mg by mouth daily with breakfast.   levothyroxine 88 MCG tablet Commonly known as: SYNTHROID TAKE ONE TABLET (88 MCG TOTAL) BY MOUTH DAILY. What changed: See the new instructions. Changed by: Terald Sleeper, PA-C   metoprolol succinate 50 MG 24 hr tablet Commonly known  as: TOPROL-XL Take 1 tablet (50 mg total) by mouth daily. What changed:   medication strength  how much to take Changed by: Terald Sleeper, PA-C   multivitamin with minerals tablet Take 1 tablet by mouth daily. Spectra-vite   omeprazole 40 MG capsule Commonly known as: PRILOSEC Take 40 mg by mouth daily.          Objective:    BP (!) 156/85   Pulse (!) 105   Temp 98.7 F (37.1 C) (Oral)   Allergies  Allergen Reactions  . Sulfa Antibiotics Diarrhea    Wt Readings from Last 3 Encounters:  02/27/19 145 lb (65.8 kg)  06/23/17 138 lb (62.6 kg)    Physical Exam Constitutional:      Appearance: She is well-developed.   HENT:     Head: Normocephalic and atraumatic.  Eyes:     Conjunctiva/sclera: Conjunctivae normal.     Pupils: Pupils are equal, round, and reactive to light.  Cardiovascular:     Rate and Rhythm: Normal rate and regular rhythm.     Heart sounds: Normal heart sounds.  Pulmonary:     Effort: Pulmonary effort is normal.     Breath sounds: Normal breath sounds.  Abdominal:     General: Bowel sounds are normal.     Palpations: Abdomen is soft.  Skin:    General: Skin is warm and dry.     Findings: Erythema and lesion present. No rash.          Comments: Shallow ulcer Followed with wound care  Neurological:     Mental Status: She is alert and oriented to person, place, and time.     Deep Tendon Reflexes: Reflexes are normal and symmetric.  Psychiatric:        Behavior: Behavior normal.        Thought Content: Thought content normal.        Judgment: Judgment normal.         Assessment & Plan:   1. Primary osteoarthritis involving multiple joints - diclofenac sodium (VOLTAREN) 1 % GEL; Apply 4 g topically 4 (four) times daily. FOR KNEE arthritis  Dispense: 200 g; Refill: 11  2. History of atrial fibrillation - metoprolol succinate (TOPROL-XL) 50 MG 24 hr tablet; Take 1 tablet (50 mg total) by mouth daily.  Dispense: 30 tablet; Refill: 5  3. History of tremor Continue replacement  4. Vitamin D deficiency Continue replacement  5. History of iron deficiency anemia Continue replacement   Continue all other maintenance medications as listed above.  Follow up plan: Return in about 6 weeks (around 04/03/2019).  Educational handout given for [place] PA-C [facility] [address]  [place], [place] [id number] [phone]   02/27/2019, 2:14 PM

## 2019-02-27 NOTE — Addendum Note (Signed)
Addendum  created 02/27/19 0943 by Georgeanne Nim, CRNA   Intraprocedure Flowsheets edited

## 2019-02-27 NOTE — Op Note (Signed)
Date of procedure: 02/27/19  Pre-operative diagnosis: Visually significant age-related cataract, Right Eye (H25.811)  Post-operative diagnosis: Visually significant age-related cataract, Right Eye  Procedure: Removal of cataract via phacoemulsification and insertion of intra-ocular lens [employer] PCB00  +25.0D into the capsular bag of the Right Eye  Attending surgeon: Gerda Diss. Ercilia Bettinger, MD, MA  Anesthesia: MAC, Topical Akten  Complications: None  Estimated Blood Loss: <78m (minimal)  Specimens: None  Implants: As above  Indications:  Visually significant age-related cataract, Right Eye  Procedure:  The patient was seen and identified in the pre-operative area. The operative eye was identified and dilated.  The operative eye was marked.  Topical anesthesia was administered to the operative eye.     The patient was then to the operative suite and placed in the supine position.  A timeout was performed confirming the patient, procedure to be performed, and all other relevant information.   The patient's face was prepped and draped in the usual fashion for intra-ocular surgery.  A lid speculum was placed into the operative eye and the surgical microscope moved into place and focused.  A superotemporal paracentesis was created using a 20 gauge paracentesis blade.  Shugarcaine was injected into the anterior chamber.  Viscoelastic was injected into the anterior chamber.  A temporal clear-corneal main wound incision was created using a 2.446mmicrokeratome.  A continuous curvilinear capsulorrhexis was initiated using an irrigating cystitome and completed using capsulorrhexis forceps.  Hydrodissection and hydrodeliniation were performed.  Viscoelastic was injected into the anterior chamber.  A phacoemulsification handpiece and a chopper as a second instrument were used to remove the nucleus and epinucleus. The irrigation/aspiration handpiece was used to remove any remaining cortical  material.   The capsular bag was reinflated with viscoelastic, checked, and found to be intact.  The intraocular lens was inserted into the capsular bag and dialed into place using a Kuglen hook.  The irrigation/aspiration handpiece was used to remove any remaining viscoelastic.  The clear corneal wound and paracentesis wounds were then hydrated and checked with Weck-Cels to be watertight.  The lid-speculum and drape was removed, and the patient's face was cleaned with a wet and dry 4x4.  Maxitrol was instilled in the eye before a clear shield was taped over the eye. The patient was taken to the post-operative care unit in good condition, having tolerated the procedure well.  Post-Op Instructions: [facility] and will receive all other orders and instructions.

## 2019-02-27 NOTE — Anesthesia Postprocedure Evaluation (Signed)
Anesthesia Post Note  Patient: LAURIEANNE GALLOWAY  Procedure(s) Performed: CATARACT EXTRACTION PHACO AND INTRAOCULAR LENS PLACEMENT (IOC) (Right Eye)  Patient location during evaluation: PACU Anesthesia Type: MAC Level of consciousness: awake Pain management: pain level controlled Vital Signs Assessment: post-procedure vital signs reviewed and stable Respiratory status: spontaneous breathing Cardiovascular status: stable Postop Assessment: no apparent nausea or vomiting Anesthetic complications: no     Last Vitals:  Vitals:   02/27/19 0731 02/27/19 0859  BP: (!) 164/92 (!) 166/103  Pulse: (!) 102 86  Resp: 18 20  Temp: 36.9 C 36.8 C  SpO2: 99% 99%    Last Pain:  Vitals:   02/27/19 0859  TempSrc: Oral  PainSc: 0-No pain                 Everette Rank

## 2019-02-27 NOTE — Transfer of Care (Signed)
Immediate Anesthesia Transfer of Care Note  Patient: Elaine Kennedy  Procedure(s) Performed: CATARACT EXTRACTION PHACO AND INTRAOCULAR LENS PLACEMENT (IOC) (Right Eye)  Patient Location: PACU  Anesthesia Type:MAC  Level of Consciousness: awake  Airway & Oxygen Therapy: Patient Spontanous Breathing  Post-op Assessment: Report given to RN and Post -op Vital signs reviewed and stable  Post vital signs: Reviewed and stable  Last Vitals:  Vitals Value Taken Time  BP    Temp    Pulse    Resp    SpO2      Last Pain:  Vitals:   02/27/19 0731  TempSrc: Oral  PainSc: 0-No pain         Complications: No apparent anesthesia complications

## 2019-02-27 NOTE — Discharge Instructions (Addendum)
Please discharge patient when stable, will follow up today with Dr. Wrzosek at the [facility] office immediately following discharge.  Leave shield in place until visit.  All paperwork with discharge instructions will be given at the office. ° °

## 2019-02-27 NOTE — Anesthesia Preprocedure Evaluation (Addendum)
Anesthesia Evaluation  Patient identified by MRN, date of birth, ID band Patient awake    Reviewed: reviewed documented beta blocker date and time   Airway        Dental   Pulmonary           Cardiovascular + dysrhythmias Atrial Fibrillation      Neuro/Psych Dementia    GI/Hepatic GERD  ,  Endo/Other  Hypothyroidism   Renal/GU      Musculoskeletal   Abdominal   Peds  Hematology   Anesthesia Other Findings   Reproductive/Obstetrics                            Anesthesia Physical Anesthesia Plan  ASA: III  Anesthesia Plan: MAC   Post-op Pain Management:    Induction:   PONV Risk Score and Plan: 2 and TIVA  Airway Management Planned:   Additional Equipment:   Intra-op Plan:   Post-operative Plan:   Informed Consent: I have reviewed the patients History and Physical, chart, labs and discussed the procedure including the risks, benefits and alternatives for the proposed anesthesia with the patient or authorized representative who has indicated his/her understanding and acceptance.       Plan Discussed with: CRNA  Anesthesia Plan Comments:        Anesthesia Quick Evaluation

## 2019-02-28 ENCOUNTER — Encounter (HOSPITAL_COMMUNITY): Payer: Self-pay | Admitting: Ophthalmology

## 2019-02-28 DIAGNOSIS — H25811 Combined forms of age-related cataract, right eye: Secondary | ICD-10-CM | POA: Diagnosis not present

## 2019-03-14 DIAGNOSIS — B351 Tinea unguium: Secondary | ICD-10-CM | POA: Diagnosis not present

## 2019-03-14 DIAGNOSIS — M79676 Pain in unspecified toe(s): Secondary | ICD-10-CM | POA: Diagnosis not present

## 2019-03-14 DIAGNOSIS — I70203 Unspecified atherosclerosis of native arteries of extremities, bilateral legs: Secondary | ICD-10-CM | POA: Diagnosis not present

## 2019-03-14 DIAGNOSIS — L84 Corns and callosities: Secondary | ICD-10-CM | POA: Diagnosis not present

## 2019-03-15 ENCOUNTER — Other Ambulatory Visit: Payer: Self-pay

## 2019-03-15 NOTE — Progress Notes (Signed)
Assessment & Plan:  1. Hearing loss, unspecified hearing loss type, unspecified laterality - Discussed that if hearing does not improve with removal of cerumen to let me know and we could place a referral to an audiologist.   2. Excessive cerumen in ear canal, bilateral - Excessive cerumen successfully removed. Education provided on excessive cerumen.  - Ear wax removal  3. Cellulitis of right lower leg - Education provided on cellulitis. Wound to be cleansed daily with normal saline or wound cleanser. A thin layer of antibiotic should be applied followed by a non-adherent dry dressing. I did try to send a dressing prescription to the pharmacy as the patients daughter reports she cannot find any anywhere.  - Gauze Pads & Dressings (DERMACEA NON-ADHERENT DRESSING) 2"X3" PADS; 1 each by Does not apply route daily.  Dispense: 100 each; Refill: 0 - doxycycline (VIBRA-TABS) 100 MG tablet; Take 1 tablet (100 mg total) by mouth 2 (two) times daily for 7 days. 1 po bid  Dispense: 14 tablet; Refill: 0   Follow up plan: Return in about 2 weeks (around 03/30/2019) for Wound.  Hendricks Limes, MSN, APRN, FNP-C [facility]  Subjective:   Patient ID: Elaine Kennedy, female    DOB: October 26, 1926, 85 y.o.   MRN: [id number]  HPI: Elaine Kennedy is a 85 y.o. female presenting on 03/16/2019 for Trouble hearing  Patient is accompanied by her daughter, who she is okay with being present.   Hearing Loss: Patient presents c/o hearing loss. She is unable to hear the TV well and doesn't hear people well that are not close to her. Daughter reports this started 3 weeks ago. Patient denies any ear pain or drainage. She is not sure if it is one or both ears. She has never been to an audiologist.   Wound: Patient also has a wound on her right lower extremity that daughter believes is infected. Patient fell and sustained a skin tear a month ago. The patient's granddaughter has been applying neosporin  after cleansing with wound cleanser. The were previously apply a non-adherent dressing and gauze but have been unable to find anymore in the stores; therefore, they are covering with band-aids. They do not feel the area has improved at all. No fevers.    ROS: Negative unless specifically indicated above in HPI.   Relevant past medical history reviewed and updated as indicated.   Allergies and medications reviewed and updated.   Current Outpatient Medications:  .  acetaminophen (TYLENOL) 650 MG CR tablet, Take 650 mg by mouth every 8 (eight) hours as needed for pain. , Disp: , Rfl:  .  aspirin EC 81 MG tablet, Take 81 mg by mouth daily., Disp: , Rfl:  .  cholecalciferol (VITAMIN D) 1000 units tablet, Take 1,000 Units by mouth daily., Disp: , Rfl:  .  diclofenac sodium (VOLTAREN) 1 % GEL, Apply 4 g topically 4 (four) times daily. FOR KNEE arthritis, Disp: 200 g, Rfl: 11 .  ferrous sulfate 325 (65 FE) MG tablet, Take 325 mg by mouth daily with breakfast., Disp: , Rfl:  .  levothyroxine (SYNTHROID) 88 MCG tablet, TAKE ONE TABLET (88 MCG TOTAL) BY MOUTH DAILY., Disp: 90 tablet, Rfl: 1 .  metoprolol succinate (TOPROL-XL) 50 MG 24 hr tablet, Take 1 tablet (50 mg total) by mouth daily., Disp: 30 tablet, Rfl: 5 .  Multiple Vitamins-Minerals (MULTIVITAMIN WITH MINERALS) tablet, Take 1 tablet by mouth daily. Spectra-vite, Disp: , Rfl:  .  omeprazole (PRILOSEC) 40  MG capsule, Take 40 mg by mouth daily., Disp: , Rfl:  .  Polyethylene Glycol 400 (BLINK TEARS OP), Place 1 drop into both eyes 3 (three) times daily as needed (dry eyes)., Disp: , Rfl:  .  doxycycline (VIBRA-TABS) 100 MG tablet, Take 1 tablet (100 mg total) by mouth 2 (two) times daily for 7 days. 1 po bid, Disp: 14 tablet, Rfl: 0 .  Gauze Pads & Dressings (DERMACEA NON-ADHERENT DRESSING) 2"X3" PADS, 1 each by Does not apply route daily., Disp: 100 each, Rfl: 0  Allergies  Allergen Reactions  . Sulfa Antibiotics Diarrhea    Objective:    BP (!) 146/80   Pulse 98   Temp 98.4 F (36.9 C) (Tympanic)   Ht 5\' 3"  (1.6 m)   Wt 145 lb (65.8 kg)   SpO2 98%   BMI 25.69 kg/m    Physical Exam Vitals signs reviewed.  Constitutional:      General: She is not in acute distress.    Appearance: Normal appearance. She is normal weight. She is not ill-appearing, toxic-appearing or diaphoretic.  HENT:     Head: Normocephalic and atraumatic.     Right Ear: Ear canal and external ear normal. There is impacted cerumen.     Left Ear: Ear canal and external ear normal. There is impacted cerumen.  Eyes:     General: No scleral icterus.       Right eye: No discharge.        Left eye: No discharge.     Conjunctiva/sclera: Conjunctivae normal.  Neck:     Musculoskeletal: Normal range of motion.  Cardiovascular:     Rate and Rhythm: Normal rate and regular rhythm.     Heart sounds: Normal heart sounds. No murmur. No friction rub. No gallop.   Pulmonary:     Effort: Pulmonary effort is normal. No respiratory distress.     Breath sounds: Normal breath sounds. No stridor. No wheezing, rhonchi or rales.  Musculoskeletal: Normal range of motion.  Skin:    General: Skin is warm and dry.     Capillary Refill: Capillary refill takes less than 2 seconds.     Findings: Wound (right lower extremity; surrounding erythema; mild warmth; surround skin is blancheable; maceration around wound borders) present.  Neurological:     General: No focal deficit present.     Mental Status: She is alert and oriented to person, place, and time. Mental status is at baseline.  Psychiatric:        Mood and Affect: Mood normal.        Behavior: Behavior normal.        Thought Content: Thought content normal.        Judgment: Judgment normal.

## 2019-03-16 ENCOUNTER — Encounter: Payer: Self-pay | Admitting: Family Medicine

## 2019-03-16 ENCOUNTER — Ambulatory Visit (INDEPENDENT_AMBULATORY_CARE_PROVIDER_SITE_OTHER): Payer: Medicare Other | Admitting: Family Medicine

## 2019-03-16 VITALS — BP 146/80 | HR 98 | Temp 98.4°F | Ht 63.0 in | Wt 145.0 lb

## 2019-03-16 DIAGNOSIS — L03115 Cellulitis of right lower limb: Secondary | ICD-10-CM | POA: Diagnosis not present

## 2019-03-16 DIAGNOSIS — H6123 Impacted cerumen, bilateral: Secondary | ICD-10-CM | POA: Diagnosis not present

## 2019-03-16 DIAGNOSIS — H9190 Unspecified hearing loss, unspecified ear: Secondary | ICD-10-CM | POA: Diagnosis not present

## 2019-03-16 MED ORDER — DOXYCYCLINE HYCLATE 100 MG PO TABS: 100.0000 mg | ORAL_TABLET | Freq: Two times a day (BID) | ORAL | 0 refills | Status: AC

## 2019-03-16 MED ORDER — "DERMACEA NON-ADHERENT DRESSING 2""X3"" PADS": 1.0000 | MEDICATED_PAD | Freq: Every day | 0 refills | Status: DC

## 2019-03-16 NOTE — Patient Instructions (Signed)
Earwax Buildup, Adult The ears produce a substance called earwax that helps keep bacteria out of the ear and protects the skin in the ear canal. Occasionally, earwax can build up in the ear and cause discomfort or hearing loss. What increases the risk? This condition is more likely to develop in people who:  Are female.  Are elderly.  Naturally produce more earwax.  Clean their ears often with cotton swabs.  Use earplugs often.  Use in-ear headphones often.  Wear hearing aids.  Have narrow ear canals.  Have earwax that is overly thick or sticky.  Have eczema.  Are dehydrated.  Have excess hair in the ear canal. What are the signs or symptoms? Symptoms of this condition include:  Reduced or muffled hearing.  A feeling of fullness in the ear or feeling that the ear is plugged.  Fluid coming from the ear.  Ear pain.  Ear itch.  Ringing in the ear.  Coughing.  An obvious piece of earwax that can be seen inside the ear canal. How is this diagnosed? This condition may be diagnosed based on:  Your symptoms.  Your medical history.  An ear exam. During the exam, your health care provider will look into your ear with an instrument called an otoscope. You may have tests, including a hearing test. How is this treated? This condition may be treated by:  Using ear drops to soften the earwax.  Having the earwax removed by a health care provider. The health care provider may: ? Flush the ear with water. ? Use an instrument that has a loop on the end (curette). ? Use a suction device.  Surgery to remove the wax buildup. This may be done in severe cases. Follow these instructions at home:   Take over-the-counter and prescription medicines only as told by your health care provider.  Do not put any objects, including cotton swabs, into your ear. You can clean the opening of your ear canal with a washcloth or facial tissue.  Follow instructions from your health care  provider about cleaning your ears. Do not over-clean your ears.  Drink enough fluid to keep your urine clear or pale yellow. This will help to thin the earwax.  Keep all follow-up visits as told by your health care provider. If earwax builds up in your ears often or if you use hearing aids, consider seeing your health care provider for routine, preventive ear cleanings. Ask your health care provider how often you should schedule your cleanings.  If you have hearing aids, clean them according to instructions from the manufacturer and your health care provider. Contact a health care provider if:  You have ear pain.  You develop a fever.  You have blood, pus, or other fluid coming from your ear.  You have hearing loss.  You have ringing in your ears that does not go away.  Your symptoms do not improve with treatment.  You feel like the room is spinning (vertigo). Summary  Earwax can build up in the ear and cause discomfort or hearing loss.  The most common symptoms of this condition include reduced or muffled hearing and a feeling of fullness in the ear or feeling that the ear is plugged.  This condition may be diagnosed based on your symptoms, your medical history, and an ear exam.  This condition may be treated by using ear drops to soften the earwax or by having the earwax removed by a health care provider.  Do not put any   objects, including cotton swabs, into your ear. You can clean the opening of your ear canal with a washcloth or facial tissue. This information is not intended to replace advice given to you by your health care provider. Make sure you discuss any questions you have with your health care provider. Document Released: 09/10/2004 Document Revised: 07/16/2017 Document Reviewed: 10/14/2016 Elsevier Patient Education  [employer].   Cellulitis, Adult  Cellulitis is a skin infection. The infected area is often warm, red, swollen, and sore. It occurs most  often in the arms and lower legs. It is very important to get treated for this condition. What are the causes? This condition is caused by bacteria. The bacteria enter through a break in the skin, such as a cut, burn, insect bite, open sore, or crack. What increases the risk? This condition is more likely to occur in people who:  Have a weak body defense system (immune system).  Have open cuts, burns, bites, or scrapes on the skin.  Are older than 83 years of age.  Have a blood sugar problem (diabetes).  Have a long-lasting (chronic) liver disease (cirrhosis) or kidney disease.  Are very overweight (obese).  Have a skin problem, such as: ? Itchy rash (eczema). ? Slow movement of blood in the veins (venous stasis). ? Fluid buildup below the skin (edema).  Have been treated with high-energy rays (radiation).  Use IV drugs. What are the signs or symptoms? Symptoms of this condition include:  Skin that is: ? Red. ? Streaking. ? Spotting. ? Swollen. ? Sore or painful when you touch it. ? Warm.  A fever.  Chills.  Blisters. How is this diagnosed? This condition is diagnosed based on:  Medical history.  Physical exam.  Blood tests.  Imaging tests. How is this treated? Treatment for this condition may include:  Medicines to treat infections or allergies.  Home care, such as: ? Rest. ? Placing cold or warm cloths (compresses) on the skin.  Hospital care, if the condition is very bad. Follow these instructions at home: Medicines  Take over-the-counter and prescription medicines only as told by your doctor.  If you were prescribed an antibiotic medicine, take it as told by your doctor. Do not stop taking it even if you start to feel better. General instructions   Drink enough fluid to keep your pee (urine) pale yellow.  Do not touch or rub the infected area.  Raise (elevate) the infected area above the level of your heart while you are sitting or lying  down.  Place cold or warm cloths on the area as told by your doctor.  Keep all follow-up visits as told by your doctor. This is important. Contact a doctor if:  You have a fever.  You do not start to get better after 1-2 days of treatment.  Your bone or joint under the infected area starts to hurt after the skin has healed.  Your infection comes back. This can happen in the same area or another area.  You have a swollen bump in the area.  You have new symptoms.  You feel ill and have muscle aches and pains. Get help right away if:  Your symptoms get worse.  You feel very sleepy.  You throw up (vomit) or have watery poop (diarrhea) for a long time.  You see red streaks coming from the area.  Your red area gets larger.  Your red area turns dark in color. These symptoms may represent a serious problem  that is an emergency. Do not wait to see if the symptoms will go away. Get medical help right away. Call your local emergency services (911 in the [place] Do not drive yourself to the hospital. Summary  Cellulitis is a skin infection. The area is often warm, red, swollen, and sore.  This condition is treated with medicines, rest, and cold and warm cloths.  Take all medicines only as told by your doctor.  Tell your doctor if symptoms do not start to get better after 1-2 days of treatment. This information is not intended to replace advice given to you by your health care provider. Make sure you discuss any questions you have with your health care provider. Document Released: 01/20/2008 Document Revised: 12/23/2017 Document Reviewed: 12/23/2017 Elsevier Patient Education  2020 [employer].

## 2019-03-17 ENCOUNTER — Other Ambulatory Visit: Payer: Self-pay

## 2019-03-17 ENCOUNTER — Ambulatory Visit (INDEPENDENT_AMBULATORY_CARE_PROVIDER_SITE_OTHER): Payer: Medicare Other

## 2019-03-17 DIAGNOSIS — E1169 Type 2 diabetes mellitus with other specified complication: Secondary | ICD-10-CM

## 2019-03-17 DIAGNOSIS — E785 Hyperlipidemia, unspecified: Secondary | ICD-10-CM | POA: Diagnosis not present

## 2019-03-17 DIAGNOSIS — L97811 Non-pressure chronic ulcer of other part of right lower leg limited to breakdown of skin: Secondary | ICD-10-CM

## 2019-03-17 DIAGNOSIS — I152 Hypertension secondary to endocrine disorders: Secondary | ICD-10-CM

## 2019-03-17 DIAGNOSIS — E039 Hypothyroidism, unspecified: Secondary | ICD-10-CM

## 2019-03-17 DIAGNOSIS — L03115 Cellulitis of right lower limb: Secondary | ICD-10-CM | POA: Diagnosis not present

## 2019-03-17 DIAGNOSIS — Z4800 Encounter for change or removal of nonsurgical wound dressing: Secondary | ICD-10-CM

## 2019-03-17 DIAGNOSIS — Z7982 Long term (current) use of aspirin: Secondary | ICD-10-CM

## 2019-03-17 DIAGNOSIS — K219 Gastro-esophageal reflux disease without esophagitis: Secondary | ICD-10-CM

## 2019-03-20 DIAGNOSIS — R5381 Other malaise: Secondary | ICD-10-CM | POA: Diagnosis not present

## 2019-03-20 DIAGNOSIS — T1490XA Injury, unspecified, initial encounter: Secondary | ICD-10-CM | POA: Diagnosis not present

## 2019-03-20 DIAGNOSIS — W19XXXA Unspecified fall, initial encounter: Secondary | ICD-10-CM | POA: Diagnosis not present

## 2019-03-31 ENCOUNTER — Other Ambulatory Visit: Payer: Self-pay

## 2019-04-03 ENCOUNTER — Encounter: Payer: Self-pay | Admitting: Physician Assistant

## 2019-04-03 ENCOUNTER — Ambulatory Visit (INDEPENDENT_AMBULATORY_CARE_PROVIDER_SITE_OTHER): Payer: Medicare Other | Admitting: Physician Assistant

## 2019-04-03 VITALS — BP 160/98 | HR 96 | Temp 99.0°F

## 2019-04-03 DIAGNOSIS — I872 Venous insufficiency (chronic) (peripheral): Secondary | ICD-10-CM | POA: Insufficient documentation

## 2019-04-03 DIAGNOSIS — Z862 Personal history of diseases of the blood and blood-forming organs and certain disorders involving the immune mechanism: Secondary | ICD-10-CM

## 2019-04-03 DIAGNOSIS — L989 Disorder of the skin and subcutaneous tissue, unspecified: Secondary | ICD-10-CM

## 2019-04-03 DIAGNOSIS — Z9181 History of falling: Secondary | ICD-10-CM | POA: Insufficient documentation

## 2019-04-03 DIAGNOSIS — E559 Vitamin D deficiency, unspecified: Secondary | ICD-10-CM

## 2019-04-03 DIAGNOSIS — L03115 Cellulitis of right lower limb: Secondary | ICD-10-CM

## 2019-04-03 DIAGNOSIS — I10 Essential (primary) hypertension: Secondary | ICD-10-CM

## 2019-04-03 DIAGNOSIS — M6281 Muscle weakness (generalized): Secondary | ICD-10-CM | POA: Insufficient documentation

## 2019-04-04 NOTE — Progress Notes (Signed)
 BP (!) 160/98   Pulse 96   Temp 99 F (37.2 C) (Temporal)    Subjective:    Patient ID: Elaine Kennedy, female    DOB: 12/10/1926, 85 y.o.   MRN: [id number]  HPI: Elaine Kennedy is a 85 y.o. female presenting on 04/03/2019 for Atrial Fibrillation and Cellulitis    Past Medical History:  Diagnosis Date  . Arthritis   . Cancer (HCC)    skin cancer on head.  . Dysrhythmia    AFib  . GERD (gastroesophageal reflux disease)   . Hypothyroidism    Relevant past medical, surgical, family and social history reviewed and updated as indicated. Interim medical history since our last visit reviewed. Allergies and medications reviewed and updated. DATA REVIEWED: CHART IN EPIC  Family History reviewed for pertinent findings.  Review of Systems  Constitutional: Negative.  Negative for activity change, fatigue and fever.  HENT: Negative.   Eyes: Negative.   Respiratory: Negative.  Negative for cough.   Cardiovascular: Negative.  Negative for chest pain.  Gastrointestinal: Negative.  Negative for abdominal pain.  Endocrine: Negative.   Genitourinary: Negative.  Negative for dysuria.  Musculoskeletal: Positive for arthralgias and gait problem.  Skin: Positive for color change and wound.  Neurological: Positive for weakness.    Allergies as of 04/03/2019      Reactions   Sulfa Antibiotics Diarrhea      Medication List       Accurate as of April 03, 2019 11:59 PM. If you have any questions, ask your nurse or doctor.        STOP taking these medications   Dermacea Non-Adherent Dressing 2"X3" Pads Stopped by:  S , PA-C     TAKE these medications   acetaminophen 650 MG CR tablet Commonly known as: TYLENOL Take 650 mg by mouth every 8 (eight) hours as needed for pain.   aspirin EC 81 MG tablet Take 81 mg by mouth daily.   BLINK TEARS OP Place 1 drop into both eyes 3 (three) times daily as needed (dry eyes).   cholecalciferol 1000 units tablet Commonly known  as: VITAMIN D Take 1,000 Units by mouth daily.   diclofenac sodium 1 % Gel Commonly known as: VOLTAREN Apply 4 g topically 4 (four) times daily. FOR KNEE arthritis   ferrous sulfate 325 (65 FE) MG tablet Take 325 mg by mouth daily with breakfast.   levothyroxine 88 MCG tablet Commonly known as: SYNTHROID TAKE ONE TABLET (88 MCG TOTAL) BY MOUTH DAILY.   metoprolol succinate 50 MG 24 hr tablet Commonly known as: TOPROL-XL Take 1 tablet (50 mg total) by mouth daily.   multivitamin with minerals tablet Take 1 tablet by mouth daily. Spectra-vite   omeprazole 40 MG capsule Commonly known as: PRILOSEC Take 40 mg by mouth daily.            Durable Medical Equipment  (From admission, onward)         Start     Ordered   04/03/19 0000  DME Wheelchair manual    Comments: Patient suffers from generalized muscle weakness, high risk of falls which impairs their ability to perform daily activities like bathing and dressing in the home.  A cane or walker will not resolve issue with performing activities of daily living. A wheelchair will allow patient to safely perform daily activities. Patient can safely propel the wheelchair in the home or has a caregiver who can provide assistance. Length of need Lifetime. Accessories: elevating   leg rests (ELRs), wheel locks, extensions and anti-tippers.   04/03/19 1453             Objective:    BP (!) 160/98   Pulse 96   Temp 99 F (37.2 C) (Temporal)   Allergies  Allergen Reactions  . Sulfa Antibiotics Diarrhea    Wt Readings from Last 3 Encounters:  03/16/19 145 lb (65.8 kg)  02/27/19 145 lb (65.8 kg)  06/23/17 138 lb (62.6 kg)    Physical Exam Constitutional:      General: She is not in acute distress.    Appearance: Normal appearance. She is well-developed.  HENT:     Head: Normocephalic and atraumatic.  Cardiovascular:     Rate and Rhythm: Normal rate.  Pulmonary:     Effort: Pulmonary effort is normal.  Skin:     General: Skin is warm.     Findings: Erythema, lesion and rash present. Rash is crusting.       Neurological:     Mental Status: She is alert and oriented to person, place, and time.     Deep Tendon Reflexes: Reflexes are normal and symmetric.     Results for orders placed or performed in visit on 04/03/19  CBC with Differential/Platelet  Result Value Ref Range   WBC 5.3 3.4 - 10.8 x10E3/uL   RBC 3.96 3.77 - 5.28 x10E6/uL   Hemoglobin 11.2 11.1 - 15.9 g/dL   Hematocrit 35.8 34.0 - 46.6 %   MCV 90 79 - 97 fL   MCH 28.3 26.6 - 33.0 pg   MCHC 31.3 (L) 31.5 - 35.7 g/dL   RDW 13.6 11.7 - 15.4 %   Platelets 199 150 - 450 x10E3/uL   Neutrophils 48 Not Estab. %   Lymphs 41 Not Estab. %   Monocytes 7 Not Estab. %   Eos 3 Not Estab. %   Basos 1 Not Estab. %   Neutrophils Absolute 2.6 1.4 - 7.0 x10E3/uL   Lymphocytes Absolute 2.1 0.7 - 3.1 x10E3/uL   Monocytes Absolute 0.3 0.1 - 0.9 x10E3/uL   EOS (ABSOLUTE) 0.1 0.0 - 0.4 x10E3/uL   Basophils Absolute 0.1 0.0 - 0.2 x10E3/uL   Immature Granulocytes 0 Not Estab. %   Immature Grans (Abs) 0.0 0.0 - 0.1 x10E3/uL  CMP14+EGFR  Result Value Ref Range   Glucose 108 (H) 65 - 99 mg/dL   BUN 10 10 - 36 mg/dL   Creatinine, Ser 0.86 0.57 - 1.00 mg/dL   GFR calc non Af Amer 60 >59 mL/min/1.73   GFR calc Af Amer 69 >59 mL/min/1.73   BUN/Creatinine Ratio 12 12 - 28   Sodium 141 134 - 144 mmol/L   Potassium 3.3 (L) 3.5 - 5.2 mmol/L   Chloride 101 96 - 106 mmol/L   CO2 25 20 - 29 mmol/L   Calcium 8.8 8.7 - 10.3 mg/dL   Total Protein 6.5 6.0 - 8.5 g/dL   Albumin 4.4 3.5 - 4.6 g/dL   Globulin, Total 2.1 1.5 - 4.5 g/dL   Albumin/Globulin Ratio 2.1 1.2 - 2.2   Bilirubin Total 0.4 0.0 - 1.2 mg/dL   Alkaline Phosphatase 80 39 - 117 IU/L   AST 22 0 - 40 IU/L   ALT 16 0 - 32 IU/L  Lipid panel  Result Value Ref Range   Cholesterol, Total 168 100 - 199 mg/dL   Triglycerides 62 0 - 149 mg/dL   HDL 55 >39 mg/dL   VLDL Cholesterol Cal 12 5 -   40  mg/dL   LDL Calculated 101 (H) 0 - 99 mg/dL   Chol/HDL Ratio 3.1 0.0 - 4.4 ratio  TSH  Result Value Ref Range   TSH 2.520 0.450 - 4.500 uIU/mL  Iron  Result Value Ref Range   Iron 38 27 - 139 ug/dL  Vitamin D 1,25 dihydroxy  Result Value Ref Range   Vitamin D 1, 25 (OH)2 Total WILL FOLLOW    Vitamin D2 1, 25 (OH)2 WILL FOLLOW    Vitamin D3 1, 25 (OH)2 WILL FOLLOW       Assessment & Plan:   1. Cellulitis of right lower leg - CBC with Differential/Platelet  2. History of iron deficiency anemia - CBC with Differential/Platelet - Iron  3. Vitamin D deficiency - CBC with Differential/Platelet - Vitamin D 1,25 dihydroxy  4. Essential hypertension with goal blood pressure less than 130/80 - CBC with Differential/Platelet - CMP14+EGFR - Lipid panel - TSH - Iron  5. At high risk for falls - DME Wheelchair manual  6. Muscle weakness (generalized) - DME Wheelchair manual  7. Chronic stasis dermatitis Referral to Home health  8. Non-healing skin lesion Referral to Home health   Continue all other maintenance medications as listed above.  Follow up plan: Return in about 6 weeks (around 05/15/2019).  Educational handout given for survey.   S.  PA-C [facility] [address]  [place], [place] [address] [phone]   04/04/2019, 10:18 PM  

## 2019-04-08 LAB — CBC WITH DIFFERENTIAL/PLATELET
Basophils Absolute: 0.1 10*3/uL (ref 0.0–0.2)
Basos: 1 %
EOS (ABSOLUTE): 0.1 10*3/uL (ref 0.0–0.4)
Eos: 3 %
Hematocrit: 35.8 % (ref 34.0–46.6)
Hemoglobin: 11.2 g/dL (ref 11.1–15.9)
Immature Grans (Abs): 0 10*3/uL (ref 0.0–0.1)
Immature Granulocytes: 0 %
Lymphocytes Absolute: 2.1 10*3/uL (ref 0.7–3.1)
Lymphs: 41 %
MCH: 28.3 pg (ref 26.6–33.0)
MCHC: 31.3 g/dL — ABNORMAL LOW (ref 31.5–35.7)
MCV: 90 fL (ref 79–97)
Monocytes Absolute: 0.3 10*3/uL (ref 0.1–0.9)
Monocytes: 7 %
Neutrophils Absolute: 2.6 10*3/uL (ref 1.4–7.0)
Neutrophils: 48 %
Platelets: 199 10*3/uL (ref 150–450)
RBC: 3.96 x10E6/uL (ref 3.77–5.28)
RDW: 13.6 % (ref 11.7–15.4)
WBC: 5.3 10*3/uL (ref 3.4–10.8)

## 2019-04-08 LAB — CMP14+EGFR
ALT: 16 IU/L (ref 0–32)
AST: 22 IU/L (ref 0–40)
Albumin/Globulin Ratio: 2.1 (ref 1.2–2.2)
Albumin: 4.4 g/dL (ref 3.5–4.6)
Alkaline Phosphatase: 80 IU/L (ref 39–117)
BUN/Creatinine Ratio: 12 (ref 12–28)
BUN: 10 mg/dL (ref 10–36)
Bilirubin Total: 0.4 mg/dL (ref 0.0–1.2)
CO2: 25 mmol/L (ref 20–29)
Calcium: 8.8 mg/dL (ref 8.7–10.3)
Chloride: 101 mmol/L (ref 96–106)
Creatinine, Ser: 0.86 mg/dL (ref 0.57–1.00)
GFR calc Af Amer: 69 mL/min/{1.73_m2} (ref >59)
GFR calc non Af Amer: 60 mL/min/{1.73_m2} (ref >59)
Globulin, Total: 2.1 g/dL (ref 1.5–4.5)
Glucose: 108 mg/dL — ABNORMAL HIGH (ref 65–99)
Potassium: 3.3 mmol/L — ABNORMAL LOW (ref 3.5–5.2)
Sodium: 141 mmol/L (ref 134–144)
Total Protein: 6.5 g/dL (ref 6.0–8.5)

## 2019-04-08 LAB — LIPID PANEL
Chol/HDL Ratio: 3.1 ratio (ref 0.0–4.4)
Cholesterol, Total: 168 mg/dL (ref 100–199)
HDL: 55 mg/dL (ref >39)
LDL Calculated: 101 mg/dL — ABNORMAL HIGH (ref 0–99)
Triglycerides: 62 mg/dL (ref 0–149)
VLDL Cholesterol Cal: 12 mg/dL (ref 5–40)

## 2019-04-08 LAB — VITAMIN D 1,25 DIHYDROXY
Vitamin D 1, 25 (OH)2 Total: 59 pg/mL
Vitamin D2 1, 25 (OH)2: <10 pg/mL
Vitamin D3 1, 25 (OH)2: 59 pg/mL

## 2019-04-08 LAB — TSH: TSH: 2.52 u[IU]/mL (ref 0.450–4.500)

## 2019-04-08 LAB — IRON: Iron: 38 ug/dL (ref 27–139)

## 2019-04-11 ENCOUNTER — Telehealth: Payer: Self-pay | Admitting: Physician Assistant

## 2019-04-11 NOTE — Telephone Encounter (Signed)
I do not see that I had refilled furosemide on my list.  Please have them just check with the pharmacy.  And I do not see that she needs to take the furosemide.

## 2019-04-11 NOTE — Telephone Encounter (Signed)
Aware and verbalizes understanding.  

## 2019-04-11 NOTE — Telephone Encounter (Signed)
Patients daughter states that Dr. Edrick Oh d/c the furosemide and didn't think she needed it. The pharmacy filled it and they want to verify that she does not need. Please advise

## 2019-04-14 DIAGNOSIS — H5213 Myopia, bilateral: Secondary | ICD-10-CM | POA: Diagnosis not present

## 2019-04-18 DIAGNOSIS — Z9181 History of falling: Secondary | ICD-10-CM | POA: Diagnosis not present

## 2019-04-18 DIAGNOSIS — M6281 Muscle weakness (generalized): Secondary | ICD-10-CM | POA: Diagnosis not present

## 2019-04-25 ENCOUNTER — Telehealth: Payer: Self-pay | Admitting: Physician Assistant

## 2019-04-25 MED ORDER — OMEPRAZOLE 40 MG PO CPDR: 40.0000 mg | DELAYED_RELEASE_CAPSULE | Freq: Every day | ORAL | 1 refills | Status: DC

## 2019-04-25 NOTE — Telephone Encounter (Signed)
Daughter Bethena Roys asked to talk to Abigail Butts please call back

## 2019-04-25 NOTE — Telephone Encounter (Signed)
Elaine Kennedy states that she still has not heard from the [facility] people.  (wounds on pt legs)      Omeprazole refilled.

## 2019-04-25 NOTE — Telephone Encounter (Signed)
What is the name of the medication? Omeprazole   Have you contacted your pharmacy to request a refill? yes  Which pharmacy would you like this sent to? [facility]   Patient notified that their request is being sent to the clinical staff for review and that they should receive a call once it is complete. If they do not receive a call within 24 hours they can check with their pharmacy or our office.

## 2019-04-26 NOTE — Telephone Encounter (Signed)
HH orders were sent by community message, faxed today to [facility] at [phone]

## 2019-04-27 ENCOUNTER — Telehealth: Payer: Self-pay | Admitting: Physician Assistant

## 2019-04-27 ENCOUNTER — Other Ambulatory Visit: Payer: Self-pay | Admitting: *Deleted

## 2019-04-27 NOTE — Telephone Encounter (Signed)
Erroneous encounter

## 2019-04-27 NOTE — Telephone Encounter (Signed)
Talked to Cathy 

## 2019-04-29 DIAGNOSIS — L03115 Cellulitis of right lower limb: Secondary | ICD-10-CM | POA: Diagnosis not present

## 2019-04-29 DIAGNOSIS — Z9181 History of falling: Secondary | ICD-10-CM | POA: Diagnosis not present

## 2019-04-29 DIAGNOSIS — M170 Bilateral primary osteoarthritis of knee: Secondary | ICD-10-CM | POA: Diagnosis not present

## 2019-04-29 DIAGNOSIS — I499 Cardiac arrhythmia, unspecified: Secondary | ICD-10-CM | POA: Diagnosis not present

## 2019-04-29 DIAGNOSIS — L97811 Non-pressure chronic ulcer of other part of right lower leg limited to breakdown of skin: Secondary | ICD-10-CM | POA: Diagnosis not present

## 2019-04-29 DIAGNOSIS — I872 Venous insufficiency (chronic) (peripheral): Secondary | ICD-10-CM | POA: Diagnosis not present

## 2019-04-29 DIAGNOSIS — D509 Iron deficiency anemia, unspecified: Secondary | ICD-10-CM | POA: Diagnosis not present

## 2019-04-29 DIAGNOSIS — K219 Gastro-esophageal reflux disease without esophagitis: Secondary | ICD-10-CM | POA: Diagnosis not present

## 2019-04-29 DIAGNOSIS — E039 Hypothyroidism, unspecified: Secondary | ICD-10-CM | POA: Diagnosis not present

## 2019-04-29 DIAGNOSIS — I10 Essential (primary) hypertension: Secondary | ICD-10-CM | POA: Diagnosis not present

## 2019-04-29 DIAGNOSIS — I4891 Unspecified atrial fibrillation: Secondary | ICD-10-CM | POA: Diagnosis not present

## 2019-04-29 DIAGNOSIS — Z7982 Long term (current) use of aspirin: Secondary | ICD-10-CM | POA: Diagnosis not present

## 2019-04-29 DIAGNOSIS — Z85828 Personal history of other malignant neoplasm of skin: Secondary | ICD-10-CM | POA: Diagnosis not present

## 2019-04-29 DIAGNOSIS — E559 Vitamin D deficiency, unspecified: Secondary | ICD-10-CM | POA: Diagnosis not present

## 2019-04-29 DIAGNOSIS — L97221 Non-pressure chronic ulcer of left calf limited to breakdown of skin: Secondary | ICD-10-CM | POA: Diagnosis not present

## 2019-04-29 DIAGNOSIS — Z96649 Presence of unspecified artificial hip joint: Secondary | ICD-10-CM | POA: Diagnosis not present

## 2019-04-30 DIAGNOSIS — M170 Bilateral primary osteoarthritis of knee: Secondary | ICD-10-CM | POA: Diagnosis not present

## 2019-04-30 DIAGNOSIS — Z7982 Long term (current) use of aspirin: Secondary | ICD-10-CM | POA: Diagnosis not present

## 2019-04-30 DIAGNOSIS — L97221 Non-pressure chronic ulcer of left calf limited to breakdown of skin: Secondary | ICD-10-CM | POA: Diagnosis not present

## 2019-04-30 DIAGNOSIS — L03115 Cellulitis of right lower limb: Secondary | ICD-10-CM | POA: Diagnosis not present

## 2019-04-30 DIAGNOSIS — I4891 Unspecified atrial fibrillation: Secondary | ICD-10-CM | POA: Diagnosis not present

## 2019-04-30 DIAGNOSIS — Z9181 History of falling: Secondary | ICD-10-CM | POA: Diagnosis not present

## 2019-04-30 DIAGNOSIS — Z96649 Presence of unspecified artificial hip joint: Secondary | ICD-10-CM | POA: Diagnosis not present

## 2019-04-30 DIAGNOSIS — I872 Venous insufficiency (chronic) (peripheral): Secondary | ICD-10-CM | POA: Diagnosis not present

## 2019-04-30 DIAGNOSIS — L97811 Non-pressure chronic ulcer of other part of right lower leg limited to breakdown of skin: Secondary | ICD-10-CM | POA: Diagnosis not present

## 2019-04-30 DIAGNOSIS — I10 Essential (primary) hypertension: Secondary | ICD-10-CM | POA: Diagnosis not present

## 2019-04-30 DIAGNOSIS — E559 Vitamin D deficiency, unspecified: Secondary | ICD-10-CM | POA: Diagnosis not present

## 2019-04-30 DIAGNOSIS — I499 Cardiac arrhythmia, unspecified: Secondary | ICD-10-CM | POA: Diagnosis not present

## 2019-04-30 DIAGNOSIS — K219 Gastro-esophageal reflux disease without esophagitis: Secondary | ICD-10-CM | POA: Diagnosis not present

## 2019-04-30 DIAGNOSIS — D509 Iron deficiency anemia, unspecified: Secondary | ICD-10-CM | POA: Diagnosis not present

## 2019-04-30 DIAGNOSIS — E039 Hypothyroidism, unspecified: Secondary | ICD-10-CM | POA: Diagnosis not present

## 2019-04-30 DIAGNOSIS — Z85828 Personal history of other malignant neoplasm of skin: Secondary | ICD-10-CM | POA: Diagnosis not present

## 2019-05-01 ENCOUNTER — Telehealth: Payer: Self-pay | Admitting: *Deleted

## 2019-05-01 NOTE — Telephone Encounter (Signed)
VM from [place] w[facility] Would like to know if they can have verbal order for unna boots to help with her legs Please advise

## 2019-05-01 NOTE — Telephone Encounter (Signed)
Yes, order unna boots for leg edema and cellulitis

## 2019-05-02 NOTE — Telephone Encounter (Signed)
HH nurse aware

## 2019-05-03 DIAGNOSIS — I499 Cardiac arrhythmia, unspecified: Secondary | ICD-10-CM | POA: Diagnosis not present

## 2019-05-03 DIAGNOSIS — Z9181 History of falling: Secondary | ICD-10-CM | POA: Diagnosis not present

## 2019-05-03 DIAGNOSIS — E039 Hypothyroidism, unspecified: Secondary | ICD-10-CM | POA: Diagnosis not present

## 2019-05-03 DIAGNOSIS — L97221 Non-pressure chronic ulcer of left calf limited to breakdown of skin: Secondary | ICD-10-CM | POA: Diagnosis not present

## 2019-05-03 DIAGNOSIS — L03115 Cellulitis of right lower limb: Secondary | ICD-10-CM | POA: Diagnosis not present

## 2019-05-03 DIAGNOSIS — Z85828 Personal history of other malignant neoplasm of skin: Secondary | ICD-10-CM | POA: Diagnosis not present

## 2019-05-03 DIAGNOSIS — I4891 Unspecified atrial fibrillation: Secondary | ICD-10-CM | POA: Diagnosis not present

## 2019-05-03 DIAGNOSIS — L97811 Non-pressure chronic ulcer of other part of right lower leg limited to breakdown of skin: Secondary | ICD-10-CM | POA: Diagnosis not present

## 2019-05-03 DIAGNOSIS — M170 Bilateral primary osteoarthritis of knee: Secondary | ICD-10-CM | POA: Diagnosis not present

## 2019-05-03 DIAGNOSIS — K219 Gastro-esophageal reflux disease without esophagitis: Secondary | ICD-10-CM | POA: Diagnosis not present

## 2019-05-03 DIAGNOSIS — I872 Venous insufficiency (chronic) (peripheral): Secondary | ICD-10-CM | POA: Diagnosis not present

## 2019-05-03 DIAGNOSIS — Z96649 Presence of unspecified artificial hip joint: Secondary | ICD-10-CM | POA: Diagnosis not present

## 2019-05-03 DIAGNOSIS — E559 Vitamin D deficiency, unspecified: Secondary | ICD-10-CM | POA: Diagnosis not present

## 2019-05-03 DIAGNOSIS — Z7982 Long term (current) use of aspirin: Secondary | ICD-10-CM | POA: Diagnosis not present

## 2019-05-03 DIAGNOSIS — D509 Iron deficiency anemia, unspecified: Secondary | ICD-10-CM | POA: Diagnosis not present

## 2019-05-03 DIAGNOSIS — I10 Essential (primary) hypertension: Secondary | ICD-10-CM | POA: Diagnosis not present

## 2019-05-04 DIAGNOSIS — H52223 Regular astigmatism, bilateral: Secondary | ICD-10-CM | POA: Diagnosis not present

## 2019-05-04 DIAGNOSIS — H524 Presbyopia: Secondary | ICD-10-CM | POA: Diagnosis not present

## 2019-05-05 ENCOUNTER — Ambulatory Visit: Payer: Medicare Other | Admitting: Family

## 2019-05-05 DIAGNOSIS — L97811 Non-pressure chronic ulcer of other part of right lower leg limited to breakdown of skin: Secondary | ICD-10-CM | POA: Diagnosis not present

## 2019-05-05 DIAGNOSIS — Z85828 Personal history of other malignant neoplasm of skin: Secondary | ICD-10-CM | POA: Diagnosis not present

## 2019-05-05 DIAGNOSIS — E559 Vitamin D deficiency, unspecified: Secondary | ICD-10-CM | POA: Diagnosis not present

## 2019-05-05 DIAGNOSIS — K219 Gastro-esophageal reflux disease without esophagitis: Secondary | ICD-10-CM | POA: Diagnosis not present

## 2019-05-05 DIAGNOSIS — I10 Essential (primary) hypertension: Secondary | ICD-10-CM | POA: Diagnosis not present

## 2019-05-05 DIAGNOSIS — M170 Bilateral primary osteoarthritis of knee: Secondary | ICD-10-CM | POA: Diagnosis not present

## 2019-05-05 DIAGNOSIS — E039 Hypothyroidism, unspecified: Secondary | ICD-10-CM | POA: Diagnosis not present

## 2019-05-05 DIAGNOSIS — Z7982 Long term (current) use of aspirin: Secondary | ICD-10-CM | POA: Diagnosis not present

## 2019-05-05 DIAGNOSIS — Z96649 Presence of unspecified artificial hip joint: Secondary | ICD-10-CM | POA: Diagnosis not present

## 2019-05-05 DIAGNOSIS — I872 Venous insufficiency (chronic) (peripheral): Secondary | ICD-10-CM | POA: Diagnosis not present

## 2019-05-05 DIAGNOSIS — I4891 Unspecified atrial fibrillation: Secondary | ICD-10-CM | POA: Diagnosis not present

## 2019-05-05 DIAGNOSIS — L97221 Non-pressure chronic ulcer of left calf limited to breakdown of skin: Secondary | ICD-10-CM | POA: Diagnosis not present

## 2019-05-05 DIAGNOSIS — L03115 Cellulitis of right lower limb: Secondary | ICD-10-CM | POA: Diagnosis not present

## 2019-05-05 DIAGNOSIS — Z9181 History of falling: Secondary | ICD-10-CM | POA: Diagnosis not present

## 2019-05-05 DIAGNOSIS — I499 Cardiac arrhythmia, unspecified: Secondary | ICD-10-CM | POA: Diagnosis not present

## 2019-05-05 DIAGNOSIS — D509 Iron deficiency anemia, unspecified: Secondary | ICD-10-CM | POA: Diagnosis not present

## 2019-05-08 DIAGNOSIS — K219 Gastro-esophageal reflux disease without esophagitis: Secondary | ICD-10-CM | POA: Diagnosis not present

## 2019-05-08 DIAGNOSIS — Z7982 Long term (current) use of aspirin: Secondary | ICD-10-CM | POA: Diagnosis not present

## 2019-05-08 DIAGNOSIS — Z96649 Presence of unspecified artificial hip joint: Secondary | ICD-10-CM | POA: Diagnosis not present

## 2019-05-08 DIAGNOSIS — I872 Venous insufficiency (chronic) (peripheral): Secondary | ICD-10-CM | POA: Diagnosis not present

## 2019-05-08 DIAGNOSIS — I499 Cardiac arrhythmia, unspecified: Secondary | ICD-10-CM | POA: Diagnosis not present

## 2019-05-08 DIAGNOSIS — E039 Hypothyroidism, unspecified: Secondary | ICD-10-CM | POA: Diagnosis not present

## 2019-05-08 DIAGNOSIS — Z85828 Personal history of other malignant neoplasm of skin: Secondary | ICD-10-CM | POA: Diagnosis not present

## 2019-05-08 DIAGNOSIS — I10 Essential (primary) hypertension: Secondary | ICD-10-CM | POA: Diagnosis not present

## 2019-05-08 DIAGNOSIS — M170 Bilateral primary osteoarthritis of knee: Secondary | ICD-10-CM | POA: Diagnosis not present

## 2019-05-08 DIAGNOSIS — I4891 Unspecified atrial fibrillation: Secondary | ICD-10-CM | POA: Diagnosis not present

## 2019-05-08 DIAGNOSIS — D509 Iron deficiency anemia, unspecified: Secondary | ICD-10-CM | POA: Diagnosis not present

## 2019-05-08 DIAGNOSIS — L03115 Cellulitis of right lower limb: Secondary | ICD-10-CM | POA: Diagnosis not present

## 2019-05-08 DIAGNOSIS — E559 Vitamin D deficiency, unspecified: Secondary | ICD-10-CM | POA: Diagnosis not present

## 2019-05-08 DIAGNOSIS — L97221 Non-pressure chronic ulcer of left calf limited to breakdown of skin: Secondary | ICD-10-CM | POA: Diagnosis not present

## 2019-05-08 DIAGNOSIS — L97811 Non-pressure chronic ulcer of other part of right lower leg limited to breakdown of skin: Secondary | ICD-10-CM | POA: Diagnosis not present

## 2019-05-08 DIAGNOSIS — Z9181 History of falling: Secondary | ICD-10-CM | POA: Diagnosis not present

## 2019-05-10 DIAGNOSIS — I4891 Unspecified atrial fibrillation: Secondary | ICD-10-CM | POA: Diagnosis not present

## 2019-05-10 DIAGNOSIS — I872 Venous insufficiency (chronic) (peripheral): Secondary | ICD-10-CM | POA: Diagnosis not present

## 2019-05-10 DIAGNOSIS — Z9181 History of falling: Secondary | ICD-10-CM | POA: Diagnosis not present

## 2019-05-10 DIAGNOSIS — Z7982 Long term (current) use of aspirin: Secondary | ICD-10-CM | POA: Diagnosis not present

## 2019-05-10 DIAGNOSIS — Z96649 Presence of unspecified artificial hip joint: Secondary | ICD-10-CM | POA: Diagnosis not present

## 2019-05-10 DIAGNOSIS — I499 Cardiac arrhythmia, unspecified: Secondary | ICD-10-CM | POA: Diagnosis not present

## 2019-05-10 DIAGNOSIS — E039 Hypothyroidism, unspecified: Secondary | ICD-10-CM | POA: Diagnosis not present

## 2019-05-10 DIAGNOSIS — E559 Vitamin D deficiency, unspecified: Secondary | ICD-10-CM | POA: Diagnosis not present

## 2019-05-10 DIAGNOSIS — K219 Gastro-esophageal reflux disease without esophagitis: Secondary | ICD-10-CM | POA: Diagnosis not present

## 2019-05-10 DIAGNOSIS — L97221 Non-pressure chronic ulcer of left calf limited to breakdown of skin: Secondary | ICD-10-CM | POA: Diagnosis not present

## 2019-05-10 DIAGNOSIS — L03115 Cellulitis of right lower limb: Secondary | ICD-10-CM | POA: Diagnosis not present

## 2019-05-10 DIAGNOSIS — L97811 Non-pressure chronic ulcer of other part of right lower leg limited to breakdown of skin: Secondary | ICD-10-CM | POA: Diagnosis not present

## 2019-05-10 DIAGNOSIS — M170 Bilateral primary osteoarthritis of knee: Secondary | ICD-10-CM | POA: Diagnosis not present

## 2019-05-10 DIAGNOSIS — Z85828 Personal history of other malignant neoplasm of skin: Secondary | ICD-10-CM | POA: Diagnosis not present

## 2019-05-10 DIAGNOSIS — I10 Essential (primary) hypertension: Secondary | ICD-10-CM | POA: Diagnosis not present

## 2019-05-10 DIAGNOSIS — D509 Iron deficiency anemia, unspecified: Secondary | ICD-10-CM | POA: Diagnosis not present

## 2019-05-11 DIAGNOSIS — Z85828 Personal history of other malignant neoplasm of skin: Secondary | ICD-10-CM | POA: Diagnosis not present

## 2019-05-11 DIAGNOSIS — E039 Hypothyroidism, unspecified: Secondary | ICD-10-CM | POA: Diagnosis not present

## 2019-05-11 DIAGNOSIS — L97221 Non-pressure chronic ulcer of left calf limited to breakdown of skin: Secondary | ICD-10-CM | POA: Diagnosis not present

## 2019-05-11 DIAGNOSIS — I499 Cardiac arrhythmia, unspecified: Secondary | ICD-10-CM | POA: Diagnosis not present

## 2019-05-11 DIAGNOSIS — Z96649 Presence of unspecified artificial hip joint: Secondary | ICD-10-CM | POA: Diagnosis not present

## 2019-05-11 DIAGNOSIS — M170 Bilateral primary osteoarthritis of knee: Secondary | ICD-10-CM | POA: Diagnosis not present

## 2019-05-11 DIAGNOSIS — I872 Venous insufficiency (chronic) (peripheral): Secondary | ICD-10-CM | POA: Diagnosis not present

## 2019-05-11 DIAGNOSIS — Z9181 History of falling: Secondary | ICD-10-CM | POA: Diagnosis not present

## 2019-05-11 DIAGNOSIS — L03115 Cellulitis of right lower limb: Secondary | ICD-10-CM | POA: Diagnosis not present

## 2019-05-11 DIAGNOSIS — Z7982 Long term (current) use of aspirin: Secondary | ICD-10-CM | POA: Diagnosis not present

## 2019-05-11 DIAGNOSIS — I4891 Unspecified atrial fibrillation: Secondary | ICD-10-CM | POA: Diagnosis not present

## 2019-05-11 DIAGNOSIS — D509 Iron deficiency anemia, unspecified: Secondary | ICD-10-CM | POA: Diagnosis not present

## 2019-05-11 DIAGNOSIS — E559 Vitamin D deficiency, unspecified: Secondary | ICD-10-CM | POA: Diagnosis not present

## 2019-05-11 DIAGNOSIS — K219 Gastro-esophageal reflux disease without esophagitis: Secondary | ICD-10-CM | POA: Diagnosis not present

## 2019-05-11 DIAGNOSIS — I10 Essential (primary) hypertension: Secondary | ICD-10-CM | POA: Diagnosis not present

## 2019-05-11 DIAGNOSIS — L97811 Non-pressure chronic ulcer of other part of right lower leg limited to breakdown of skin: Secondary | ICD-10-CM | POA: Diagnosis not present

## 2019-05-12 DIAGNOSIS — E039 Hypothyroidism, unspecified: Secondary | ICD-10-CM | POA: Diagnosis not present

## 2019-05-12 DIAGNOSIS — I499 Cardiac arrhythmia, unspecified: Secondary | ICD-10-CM | POA: Diagnosis not present

## 2019-05-12 DIAGNOSIS — Z7982 Long term (current) use of aspirin: Secondary | ICD-10-CM | POA: Diagnosis not present

## 2019-05-12 DIAGNOSIS — Z85828 Personal history of other malignant neoplasm of skin: Secondary | ICD-10-CM | POA: Diagnosis not present

## 2019-05-12 DIAGNOSIS — I4891 Unspecified atrial fibrillation: Secondary | ICD-10-CM | POA: Diagnosis not present

## 2019-05-12 DIAGNOSIS — L03115 Cellulitis of right lower limb: Secondary | ICD-10-CM | POA: Diagnosis not present

## 2019-05-12 DIAGNOSIS — I872 Venous insufficiency (chronic) (peripheral): Secondary | ICD-10-CM | POA: Diagnosis not present

## 2019-05-12 DIAGNOSIS — L97221 Non-pressure chronic ulcer of left calf limited to breakdown of skin: Secondary | ICD-10-CM | POA: Diagnosis not present

## 2019-05-12 DIAGNOSIS — K219 Gastro-esophageal reflux disease without esophagitis: Secondary | ICD-10-CM | POA: Diagnosis not present

## 2019-05-12 DIAGNOSIS — D509 Iron deficiency anemia, unspecified: Secondary | ICD-10-CM | POA: Diagnosis not present

## 2019-05-12 DIAGNOSIS — L97811 Non-pressure chronic ulcer of other part of right lower leg limited to breakdown of skin: Secondary | ICD-10-CM | POA: Diagnosis not present

## 2019-05-12 DIAGNOSIS — I10 Essential (primary) hypertension: Secondary | ICD-10-CM | POA: Diagnosis not present

## 2019-05-12 DIAGNOSIS — Z9181 History of falling: Secondary | ICD-10-CM | POA: Diagnosis not present

## 2019-05-12 DIAGNOSIS — Z96649 Presence of unspecified artificial hip joint: Secondary | ICD-10-CM | POA: Diagnosis not present

## 2019-05-12 DIAGNOSIS — E559 Vitamin D deficiency, unspecified: Secondary | ICD-10-CM | POA: Diagnosis not present

## 2019-05-12 DIAGNOSIS — M170 Bilateral primary osteoarthritis of knee: Secondary | ICD-10-CM | POA: Diagnosis not present

## 2019-05-15 ENCOUNTER — Other Ambulatory Visit: Payer: Self-pay

## 2019-05-15 DIAGNOSIS — D509 Iron deficiency anemia, unspecified: Secondary | ICD-10-CM | POA: Diagnosis not present

## 2019-05-15 DIAGNOSIS — I10 Essential (primary) hypertension: Secondary | ICD-10-CM | POA: Diagnosis not present

## 2019-05-15 DIAGNOSIS — Z9181 History of falling: Secondary | ICD-10-CM | POA: Diagnosis not present

## 2019-05-15 DIAGNOSIS — M170 Bilateral primary osteoarthritis of knee: Secondary | ICD-10-CM | POA: Diagnosis not present

## 2019-05-15 DIAGNOSIS — I4891 Unspecified atrial fibrillation: Secondary | ICD-10-CM | POA: Diagnosis not present

## 2019-05-15 DIAGNOSIS — K219 Gastro-esophageal reflux disease without esophagitis: Secondary | ICD-10-CM | POA: Diagnosis not present

## 2019-05-15 DIAGNOSIS — Z96649 Presence of unspecified artificial hip joint: Secondary | ICD-10-CM | POA: Diagnosis not present

## 2019-05-15 DIAGNOSIS — Z85828 Personal history of other malignant neoplasm of skin: Secondary | ICD-10-CM | POA: Diagnosis not present

## 2019-05-15 DIAGNOSIS — L03115 Cellulitis of right lower limb: Secondary | ICD-10-CM | POA: Diagnosis not present

## 2019-05-15 DIAGNOSIS — L97811 Non-pressure chronic ulcer of other part of right lower leg limited to breakdown of skin: Secondary | ICD-10-CM | POA: Diagnosis not present

## 2019-05-15 DIAGNOSIS — E559 Vitamin D deficiency, unspecified: Secondary | ICD-10-CM | POA: Diagnosis not present

## 2019-05-15 DIAGNOSIS — I499 Cardiac arrhythmia, unspecified: Secondary | ICD-10-CM | POA: Diagnosis not present

## 2019-05-15 DIAGNOSIS — L97221 Non-pressure chronic ulcer of left calf limited to breakdown of skin: Secondary | ICD-10-CM | POA: Diagnosis not present

## 2019-05-15 DIAGNOSIS — E039 Hypothyroidism, unspecified: Secondary | ICD-10-CM | POA: Diagnosis not present

## 2019-05-15 DIAGNOSIS — Z7982 Long term (current) use of aspirin: Secondary | ICD-10-CM | POA: Diagnosis not present

## 2019-05-15 DIAGNOSIS — I872 Venous insufficiency (chronic) (peripheral): Secondary | ICD-10-CM | POA: Diagnosis not present

## 2019-05-16 ENCOUNTER — Ambulatory Visit (INDEPENDENT_AMBULATORY_CARE_PROVIDER_SITE_OTHER): Payer: Medicare Other | Admitting: Physician Assistant

## 2019-05-16 ENCOUNTER — Encounter: Payer: Self-pay | Admitting: Physician Assistant

## 2019-05-16 VITALS — BP 170/102 | HR 79 | Temp 97.8°F | Ht 63.0 in | Wt 146.4 lb

## 2019-05-16 DIAGNOSIS — M150 Primary generalized (osteo)arthritis: Secondary | ICD-10-CM

## 2019-05-16 DIAGNOSIS — I872 Venous insufficiency (chronic) (peripheral): Secondary | ICD-10-CM | POA: Diagnosis not present

## 2019-05-16 DIAGNOSIS — I10 Essential (primary) hypertension: Secondary | ICD-10-CM

## 2019-05-16 DIAGNOSIS — L97221 Non-pressure chronic ulcer of left calf limited to breakdown of skin: Secondary | ICD-10-CM | POA: Diagnosis not present

## 2019-05-16 DIAGNOSIS — M159 Polyosteoarthritis, unspecified: Secondary | ICD-10-CM

## 2019-05-16 MED ORDER — LISINOPRIL 10 MG PO TABS: 10.0000 mg | ORAL_TABLET | Freq: Every day | ORAL | 2 refills | Status: DC

## 2019-05-17 DIAGNOSIS — Z96649 Presence of unspecified artificial hip joint: Secondary | ICD-10-CM | POA: Diagnosis not present

## 2019-05-17 DIAGNOSIS — I872 Venous insufficiency (chronic) (peripheral): Secondary | ICD-10-CM | POA: Diagnosis not present

## 2019-05-17 DIAGNOSIS — I10 Essential (primary) hypertension: Secondary | ICD-10-CM | POA: Diagnosis not present

## 2019-05-17 DIAGNOSIS — L97811 Non-pressure chronic ulcer of other part of right lower leg limited to breakdown of skin: Secondary | ICD-10-CM | POA: Diagnosis not present

## 2019-05-17 DIAGNOSIS — Z7982 Long term (current) use of aspirin: Secondary | ICD-10-CM | POA: Diagnosis not present

## 2019-05-17 DIAGNOSIS — L97221 Non-pressure chronic ulcer of left calf limited to breakdown of skin: Secondary | ICD-10-CM | POA: Diagnosis not present

## 2019-05-17 DIAGNOSIS — M170 Bilateral primary osteoarthritis of knee: Secondary | ICD-10-CM | POA: Diagnosis not present

## 2019-05-17 DIAGNOSIS — K219 Gastro-esophageal reflux disease without esophagitis: Secondary | ICD-10-CM | POA: Diagnosis not present

## 2019-05-17 DIAGNOSIS — E559 Vitamin D deficiency, unspecified: Secondary | ICD-10-CM | POA: Diagnosis not present

## 2019-05-17 DIAGNOSIS — L03115 Cellulitis of right lower limb: Secondary | ICD-10-CM | POA: Diagnosis not present

## 2019-05-17 DIAGNOSIS — E039 Hypothyroidism, unspecified: Secondary | ICD-10-CM | POA: Diagnosis not present

## 2019-05-17 DIAGNOSIS — I499 Cardiac arrhythmia, unspecified: Secondary | ICD-10-CM | POA: Diagnosis not present

## 2019-05-17 DIAGNOSIS — Z9181 History of falling: Secondary | ICD-10-CM | POA: Diagnosis not present

## 2019-05-17 DIAGNOSIS — D509 Iron deficiency anemia, unspecified: Secondary | ICD-10-CM | POA: Diagnosis not present

## 2019-05-17 DIAGNOSIS — Z85828 Personal history of other malignant neoplasm of skin: Secondary | ICD-10-CM | POA: Diagnosis not present

## 2019-05-17 DIAGNOSIS — I4891 Unspecified atrial fibrillation: Secondary | ICD-10-CM | POA: Diagnosis not present

## 2019-05-17 NOTE — Progress Notes (Signed)
BP (!) 170/102   Pulse 79   Temp 97.8 F (36.6 C) (Temporal)   Ht '5\' 3"'$  (1.6 m)   Wt 146 lb 6.4 oz (66.4 kg)   BMI 25.93 kg/m    Subjective:    Patient ID: Elaine Kennedy, female    DOB: 26-Oct-1926, 85 y.o.   MRN: [id number]  HPI: Elaine Kennedy is a 85 y.o. female presenting on 05/16/2019 for Follow-up (6 week) This patient is company by her daughter for 6-week recheck on her chronic medical conditions they do include GERD, osteoarthritis, hypertension, chronic edema and cellulitis lower legs, anemia.  She states that overall she has been feeling a lot weaker.  However blood pressure is very elevated.  Working to make some adjustments to the blood pressure and plan to have labs performed soon.  All of her medications are reviewed and we will update her blood pressure medicine but using lisinopril 10 mg 1 daily they will try to check her blood pressure regularly.  The meter that they had here today was only about 10 points off from our reading.  Have encouraged him to continue using it.  And then come back in a couple weeks we can see what the blood pressure is doing.  If there is any other changes to please let us know.   Past Medical History:  Diagnosis Date  . Arthritis   . Cancer ([place])    skin cancer on head.  [name] Dysrhythmia    AFib  . GERD (gastroesophageal reflux disease)   . Hypothyroidism    Relevant past medical, surgical, family and social history reviewed and updated as indicated. Interim medical history since our last visit reviewed. Allergies and medications reviewed and updated. DATA REVIEWED: CHART IN EPIC  Family History reviewed for pertinent findings.  Review of Systems  Constitutional: Positive for fatigue.  HENT: Negative.   Eyes: Negative.   Respiratory: Negative.   Cardiovascular: Positive for leg swelling. Negative for chest pain and palpitations.  Gastrointestinal: Negative.   Genitourinary: Negative.   Musculoskeletal: Positive for arthralgias and  back pain.  Neurological: Positive for weakness.    Allergies as of 05/16/2019      Reactions   Sulfa Antibiotics Diarrhea      Medication List       Accurate as of May 16, 2019 11:59 PM. If you have any questions, ask your nurse or doctor.        acetaminophen 650 MG CR tablet Commonly known as: TYLENOL Take 650 mg by mouth every 8 (eight) hours as needed for pain.   aspirin EC 81 MG tablet Take 81 mg by mouth daily.   BLINK TEARS OP Place 1 drop into both eyes 3 (three) times daily as needed (dry eyes).   cholecalciferol 1000 units tablet Commonly known as: VITAMIN D Take 1,000 Units by mouth daily.   diclofenac sodium 1 % Gel Commonly known as: VOLTAREN Apply 4 g topically 4 (four) times daily. FOR KNEE arthritis   ferrous sulfate 325 (65 FE) MG tablet Take 325 mg by mouth daily with breakfast.   levothyroxine 88 MCG tablet Commonly known as: SYNTHROID TAKE ONE TABLET (88 MCG TOTAL) BY MOUTH DAILY.   lisinopril 10 MG tablet Commonly known as: ZESTRIL Take 1 tablet (10 mg total) by mouth daily. Started by: Terald Sleeper, PA-C   metoprolol succinate 50 MG 24 hr tablet Commonly known as: TOPROL-XL Take 1 tablet (50 mg total) by mouth daily.  multivitamin with minerals tablet Take 1 tablet by mouth daily. Spectra-vite   omeprazole 40 MG capsule Commonly known as: PRILOSEC Take 1 capsule (40 mg total) by mouth daily.          Objective:    BP (!) 170/102   Pulse 79   Temp 97.8 F (36.6 C) (Temporal)   Ht '5\' 3"'$  (1.6 m)   Wt 146 lb 6.4 oz (66.4 kg)   BMI 25.93 kg/m   Allergies  Allergen Reactions  . Sulfa Antibiotics Diarrhea    Wt Readings from Last 3 Encounters:  05/16/19 146 lb 6.4 oz (66.4 kg)  03/16/19 145 lb (65.8 kg)  02/27/19 145 lb (65.8 kg)    Physical Exam Constitutional:      General: She is not in acute distress.    Appearance: Normal appearance. She is well-developed.  HENT:     Head: Normocephalic and atraumatic.   Cardiovascular:     Rate and Rhythm: Normal rate.  Pulmonary:     Effort: Pulmonary effort is normal.  Skin:    General: Skin is warm and dry.     Findings: No rash.  Neurological:     Mental Status: She is alert and oriented to person, place, and time.     Deep Tendon Reflexes: Reflexes are normal and symmetric.     Results for orders placed or performed in visit on 04/03/19  CBC with Differential/Platelet  Result Value Ref Range   WBC 5.3 3.4 - 10.8 x10E3/uL   RBC 3.96 3.77 - 5.28 x10E6/uL   Hemoglobin 11.2 11.1 - 15.9 g/dL   Hematocrit 35.8 34.0 - 46.6 %   MCV 90 79 - 97 fL   MCH 28.3 26.6 - 33.0 pg   MCHC 31.3 (L) 31.5 - 35.7 g/dL   RDW 13.6 11.7 - 15.4 %   Platelets 199 150 - 450 x10E3/uL   Neutrophils 48 Not Estab. %   Lymphs 41 Not Estab. %   Monocytes 7 Not Estab. %   Eos 3 Not Estab. %   Basos 1 Not Estab. %   Neutrophils Absolute 2.6 1.4 - 7.0 x10E3/uL   Lymphocytes Absolute 2.1 0.7 - 3.1 x10E3/uL   Monocytes Absolute 0.3 0.1 - 0.9 x10E3/uL   EOS (ABSOLUTE) 0.1 0.0 - 0.4 x10E3/uL   Basophils Absolute 0.1 0.0 - 0.2 x10E3/uL   Immature Granulocytes 0 Not Estab. %   Immature Grans (Abs) 0.0 0.0 - 0.1 x10E3/uL  CMP14+EGFR  Result Value Ref Range   Glucose 108 (H) 65 - 99 mg/dL   BUN 10 10 - 36 mg/dL   Creatinine, Ser 0.86 0.57 - 1.00 mg/dL   GFR calc non Af Amer 60 >59 mL/min/1.73   GFR calc Af Amer 69 >59 mL/min/1.73   BUN/Creatinine Ratio 12 12 - 28   Sodium 141 134 - 144 mmol/L   Potassium 3.3 (L) 3.5 - 5.2 mmol/L   Chloride 101 96 - 106 mmol/L   CO2 25 20 - 29 mmol/L   Calcium 8.8 8.7 - 10.3 mg/dL   Total Protein 6.5 6.0 - 8.5 g/dL   Albumin 4.4 3.5 - 4.6 g/dL   Globulin, Total 2.1 1.5 - 4.5 g/dL   Albumin/Globulin Ratio 2.1 1.2 - 2.2   Bilirubin Total 0.4 0.0 - 1.2 mg/dL   Alkaline Phosphatase 80 39 - 117 IU/L   AST 22 0 - 40 IU/L   ALT 16 0 - 32 IU/L  Lipid panel  Result Value Ref Range  Cholesterol, Total 168 100 - 199 mg/dL   Triglycerides  62 0 - 149 mg/dL   HDL 55 >39 mg/dL   VLDL Cholesterol Cal 12 5 - 40 mg/dL   LDL Calculated 101 (H) 0 - 99 mg/dL   Chol/HDL Ratio 3.1 0.0 - 4.4 ratio  TSH  Result Value Ref Range   TSH 2.520 0.450 - 4.500 uIU/mL  Iron  Result Value Ref Range   Iron 38 27 - 139 ug/dL  Vitamin D 1,25 dihydroxy  Result Value Ref Range   Vitamin D 1, 25 (OH)2 Total 59 pg/mL   Vitamin D2 1, 25 (OH)2 <10 pg/mL   Vitamin D3 1, 25 (OH)2 59 pg/mL      Assessment & Plan:   1. Essential hypertension with goal blood pressure less than 130/80 - lisinopril (ZESTRIL) 10 MG tablet; Take 1 tablet (10 mg total) by mouth daily.  Dispense: 30 tablet; Refill: 2  2. Primary osteoarthritis involving multiple joints Continue medications   Continue all other maintenance medications as listed above.  Follow up plan: Return in about 4 weeks (around 06/13/2019) for recheck medications and labs.  Educational handout given for hypertension  Terald Sleeper PA-C [facility] [address]  [place], [place] [address] [phone]   05/17/2019, 1:56 PM

## 2019-05-18 DIAGNOSIS — I4891 Unspecified atrial fibrillation: Secondary | ICD-10-CM | POA: Diagnosis not present

## 2019-05-18 DIAGNOSIS — E559 Vitamin D deficiency, unspecified: Secondary | ICD-10-CM | POA: Diagnosis not present

## 2019-05-18 DIAGNOSIS — L97811 Non-pressure chronic ulcer of other part of right lower leg limited to breakdown of skin: Secondary | ICD-10-CM | POA: Diagnosis not present

## 2019-05-18 DIAGNOSIS — I499 Cardiac arrhythmia, unspecified: Secondary | ICD-10-CM | POA: Diagnosis not present

## 2019-05-18 DIAGNOSIS — L97221 Non-pressure chronic ulcer of left calf limited to breakdown of skin: Secondary | ICD-10-CM | POA: Diagnosis not present

## 2019-05-18 DIAGNOSIS — L03115 Cellulitis of right lower limb: Secondary | ICD-10-CM | POA: Diagnosis not present

## 2019-05-18 DIAGNOSIS — E039 Hypothyroidism, unspecified: Secondary | ICD-10-CM | POA: Diagnosis not present

## 2019-05-18 DIAGNOSIS — M170 Bilateral primary osteoarthritis of knee: Secondary | ICD-10-CM | POA: Diagnosis not present

## 2019-05-18 DIAGNOSIS — K219 Gastro-esophageal reflux disease without esophagitis: Secondary | ICD-10-CM | POA: Diagnosis not present

## 2019-05-18 DIAGNOSIS — M6281 Muscle weakness (generalized): Secondary | ICD-10-CM | POA: Diagnosis not present

## 2019-05-18 DIAGNOSIS — Z9181 History of falling: Secondary | ICD-10-CM | POA: Diagnosis not present

## 2019-05-18 DIAGNOSIS — I10 Essential (primary) hypertension: Secondary | ICD-10-CM | POA: Diagnosis not present

## 2019-05-18 DIAGNOSIS — D509 Iron deficiency anemia, unspecified: Secondary | ICD-10-CM | POA: Diagnosis not present

## 2019-05-18 DIAGNOSIS — I872 Venous insufficiency (chronic) (peripheral): Secondary | ICD-10-CM | POA: Diagnosis not present

## 2019-05-18 DIAGNOSIS — Z7982 Long term (current) use of aspirin: Secondary | ICD-10-CM | POA: Diagnosis not present

## 2019-05-18 DIAGNOSIS — Z96649 Presence of unspecified artificial hip joint: Secondary | ICD-10-CM | POA: Diagnosis not present

## 2019-05-18 DIAGNOSIS — Z85828 Personal history of other malignant neoplasm of skin: Secondary | ICD-10-CM | POA: Diagnosis not present

## 2019-05-19 DIAGNOSIS — I872 Venous insufficiency (chronic) (peripheral): Secondary | ICD-10-CM | POA: Diagnosis not present

## 2019-05-19 DIAGNOSIS — M170 Bilateral primary osteoarthritis of knee: Secondary | ICD-10-CM | POA: Diagnosis not present

## 2019-05-19 DIAGNOSIS — I10 Essential (primary) hypertension: Secondary | ICD-10-CM | POA: Diagnosis not present

## 2019-05-19 DIAGNOSIS — Z7982 Long term (current) use of aspirin: Secondary | ICD-10-CM | POA: Diagnosis not present

## 2019-05-19 DIAGNOSIS — I4891 Unspecified atrial fibrillation: Secondary | ICD-10-CM | POA: Diagnosis not present

## 2019-05-19 DIAGNOSIS — K219 Gastro-esophageal reflux disease without esophagitis: Secondary | ICD-10-CM | POA: Diagnosis not present

## 2019-05-19 DIAGNOSIS — Z9181 History of falling: Secondary | ICD-10-CM | POA: Diagnosis not present

## 2019-05-19 DIAGNOSIS — L03115 Cellulitis of right lower limb: Secondary | ICD-10-CM | POA: Diagnosis not present

## 2019-05-19 DIAGNOSIS — L97221 Non-pressure chronic ulcer of left calf limited to breakdown of skin: Secondary | ICD-10-CM | POA: Diagnosis not present

## 2019-05-19 DIAGNOSIS — D509 Iron deficiency anemia, unspecified: Secondary | ICD-10-CM | POA: Diagnosis not present

## 2019-05-19 DIAGNOSIS — E559 Vitamin D deficiency, unspecified: Secondary | ICD-10-CM | POA: Diagnosis not present

## 2019-05-19 DIAGNOSIS — I499 Cardiac arrhythmia, unspecified: Secondary | ICD-10-CM | POA: Diagnosis not present

## 2019-05-19 DIAGNOSIS — Z85828 Personal history of other malignant neoplasm of skin: Secondary | ICD-10-CM | POA: Diagnosis not present

## 2019-05-19 DIAGNOSIS — E039 Hypothyroidism, unspecified: Secondary | ICD-10-CM | POA: Diagnosis not present

## 2019-05-19 DIAGNOSIS — L97811 Non-pressure chronic ulcer of other part of right lower leg limited to breakdown of skin: Secondary | ICD-10-CM | POA: Diagnosis not present

## 2019-05-19 DIAGNOSIS — Z96649 Presence of unspecified artificial hip joint: Secondary | ICD-10-CM | POA: Diagnosis not present

## 2019-05-22 DIAGNOSIS — I4891 Unspecified atrial fibrillation: Secondary | ICD-10-CM | POA: Diagnosis not present

## 2019-05-22 DIAGNOSIS — L03115 Cellulitis of right lower limb: Secondary | ICD-10-CM | POA: Diagnosis not present

## 2019-05-22 DIAGNOSIS — E559 Vitamin D deficiency, unspecified: Secondary | ICD-10-CM | POA: Diagnosis not present

## 2019-05-22 DIAGNOSIS — L97811 Non-pressure chronic ulcer of other part of right lower leg limited to breakdown of skin: Secondary | ICD-10-CM | POA: Diagnosis not present

## 2019-05-22 DIAGNOSIS — I499 Cardiac arrhythmia, unspecified: Secondary | ICD-10-CM | POA: Diagnosis not present

## 2019-05-22 DIAGNOSIS — I10 Essential (primary) hypertension: Secondary | ICD-10-CM | POA: Diagnosis not present

## 2019-05-22 DIAGNOSIS — Z7982 Long term (current) use of aspirin: Secondary | ICD-10-CM | POA: Diagnosis not present

## 2019-05-22 DIAGNOSIS — I872 Venous insufficiency (chronic) (peripheral): Secondary | ICD-10-CM | POA: Diagnosis not present

## 2019-05-22 DIAGNOSIS — L97221 Non-pressure chronic ulcer of left calf limited to breakdown of skin: Secondary | ICD-10-CM | POA: Diagnosis not present

## 2019-05-22 DIAGNOSIS — M170 Bilateral primary osteoarthritis of knee: Secondary | ICD-10-CM | POA: Diagnosis not present

## 2019-05-22 DIAGNOSIS — D509 Iron deficiency anemia, unspecified: Secondary | ICD-10-CM | POA: Diagnosis not present

## 2019-05-22 DIAGNOSIS — Z9181 History of falling: Secondary | ICD-10-CM | POA: Diagnosis not present

## 2019-05-22 DIAGNOSIS — Z85828 Personal history of other malignant neoplasm of skin: Secondary | ICD-10-CM | POA: Diagnosis not present

## 2019-05-22 DIAGNOSIS — Z96649 Presence of unspecified artificial hip joint: Secondary | ICD-10-CM | POA: Diagnosis not present

## 2019-05-22 DIAGNOSIS — K219 Gastro-esophageal reflux disease without esophagitis: Secondary | ICD-10-CM | POA: Diagnosis not present

## 2019-05-22 DIAGNOSIS — E039 Hypothyroidism, unspecified: Secondary | ICD-10-CM | POA: Diagnosis not present

## 2019-05-24 ENCOUNTER — Ambulatory Visit (INDEPENDENT_AMBULATORY_CARE_PROVIDER_SITE_OTHER): Payer: Medicare Other

## 2019-05-24 ENCOUNTER — Other Ambulatory Visit: Payer: Self-pay

## 2019-05-24 DIAGNOSIS — I4891 Unspecified atrial fibrillation: Secondary | ICD-10-CM

## 2019-05-24 DIAGNOSIS — I872 Venous insufficiency (chronic) (peripheral): Secondary | ICD-10-CM

## 2019-05-24 DIAGNOSIS — E039 Hypothyroidism, unspecified: Secondary | ICD-10-CM

## 2019-05-24 DIAGNOSIS — Z9181 History of falling: Secondary | ICD-10-CM

## 2019-05-24 DIAGNOSIS — D509 Iron deficiency anemia, unspecified: Secondary | ICD-10-CM

## 2019-05-24 DIAGNOSIS — I10 Essential (primary) hypertension: Secondary | ICD-10-CM

## 2019-05-24 DIAGNOSIS — L97221 Non-pressure chronic ulcer of left calf limited to breakdown of skin: Secondary | ICD-10-CM | POA: Diagnosis not present

## 2019-05-24 DIAGNOSIS — L97811 Non-pressure chronic ulcer of other part of right lower leg limited to breakdown of skin: Secondary | ICD-10-CM

## 2019-05-24 DIAGNOSIS — I499 Cardiac arrhythmia, unspecified: Secondary | ICD-10-CM

## 2019-05-24 DIAGNOSIS — K219 Gastro-esophageal reflux disease without esophagitis: Secondary | ICD-10-CM

## 2019-05-24 DIAGNOSIS — E559 Vitamin D deficiency, unspecified: Secondary | ICD-10-CM

## 2019-05-24 DIAGNOSIS — Z96649 Presence of unspecified artificial hip joint: Secondary | ICD-10-CM

## 2019-05-24 DIAGNOSIS — Z85828 Personal history of other malignant neoplasm of skin: Secondary | ICD-10-CM

## 2019-05-24 DIAGNOSIS — Z7982 Long term (current) use of aspirin: Secondary | ICD-10-CM

## 2019-05-24 DIAGNOSIS — L03115 Cellulitis of right lower limb: Secondary | ICD-10-CM

## 2019-05-24 DIAGNOSIS — M170 Bilateral primary osteoarthritis of knee: Secondary | ICD-10-CM

## 2019-05-25 DIAGNOSIS — L97811 Non-pressure chronic ulcer of other part of right lower leg limited to breakdown of skin: Secondary | ICD-10-CM | POA: Diagnosis not present

## 2019-05-25 DIAGNOSIS — E559 Vitamin D deficiency, unspecified: Secondary | ICD-10-CM | POA: Diagnosis not present

## 2019-05-25 DIAGNOSIS — Z85828 Personal history of other malignant neoplasm of skin: Secondary | ICD-10-CM | POA: Diagnosis not present

## 2019-05-25 DIAGNOSIS — I499 Cardiac arrhythmia, unspecified: Secondary | ICD-10-CM | POA: Diagnosis not present

## 2019-05-25 DIAGNOSIS — K219 Gastro-esophageal reflux disease without esophagitis: Secondary | ICD-10-CM | POA: Diagnosis not present

## 2019-05-25 DIAGNOSIS — L97221 Non-pressure chronic ulcer of left calf limited to breakdown of skin: Secondary | ICD-10-CM | POA: Diagnosis not present

## 2019-05-25 DIAGNOSIS — I4891 Unspecified atrial fibrillation: Secondary | ICD-10-CM | POA: Diagnosis not present

## 2019-05-25 DIAGNOSIS — E039 Hypothyroidism, unspecified: Secondary | ICD-10-CM | POA: Diagnosis not present

## 2019-05-25 DIAGNOSIS — M170 Bilateral primary osteoarthritis of knee: Secondary | ICD-10-CM | POA: Diagnosis not present

## 2019-05-25 DIAGNOSIS — L03115 Cellulitis of right lower limb: Secondary | ICD-10-CM | POA: Diagnosis not present

## 2019-05-25 DIAGNOSIS — Z96649 Presence of unspecified artificial hip joint: Secondary | ICD-10-CM | POA: Diagnosis not present

## 2019-05-25 DIAGNOSIS — Z7982 Long term (current) use of aspirin: Secondary | ICD-10-CM | POA: Diagnosis not present

## 2019-05-25 DIAGNOSIS — I10 Essential (primary) hypertension: Secondary | ICD-10-CM | POA: Diagnosis not present

## 2019-05-25 DIAGNOSIS — D509 Iron deficiency anemia, unspecified: Secondary | ICD-10-CM | POA: Diagnosis not present

## 2019-05-25 DIAGNOSIS — Z9181 History of falling: Secondary | ICD-10-CM | POA: Diagnosis not present

## 2019-05-25 DIAGNOSIS — I872 Venous insufficiency (chronic) (peripheral): Secondary | ICD-10-CM | POA: Diagnosis not present

## 2019-05-26 ENCOUNTER — Telehealth: Payer: Self-pay | Admitting: Physician Assistant

## 2019-05-26 NOTE — Telephone Encounter (Signed)
Try to reach patient but unfortunately she did not answer.  Voicemail was left asking to contact the office.  I reviewed at her PCPs last office visit note and apparently there is a 10 point discrepancy between their blood pressure monitor and our office monitor.  Unsure if this is 10 pounds above or 10 points below.  She had quite accelerated blood pressure reading during her last office visit with systolics in the [id number] over diastolics of 0000000.  May need to consider lower dose of lisinopril however if she is having hypotensive episodes.  I will sign this out to my partner, Dr. Livia Snellen who is on-call this weekend.  Elaine Kennedy, [facility]

## 2019-05-28 NOTE — Telephone Encounter (Signed)
Please see how her readings were over the weekend.

## 2019-05-29 ENCOUNTER — Telehealth: Payer: Self-pay | Admitting: Physician Assistant

## 2019-05-29 DIAGNOSIS — L97811 Non-pressure chronic ulcer of other part of right lower leg limited to breakdown of skin: Secondary | ICD-10-CM | POA: Diagnosis not present

## 2019-05-29 DIAGNOSIS — E559 Vitamin D deficiency, unspecified: Secondary | ICD-10-CM | POA: Diagnosis not present

## 2019-05-29 DIAGNOSIS — K219 Gastro-esophageal reflux disease without esophagitis: Secondary | ICD-10-CM | POA: Diagnosis not present

## 2019-05-29 DIAGNOSIS — I10 Essential (primary) hypertension: Secondary | ICD-10-CM | POA: Diagnosis not present

## 2019-05-29 DIAGNOSIS — Z96649 Presence of unspecified artificial hip joint: Secondary | ICD-10-CM | POA: Diagnosis not present

## 2019-05-29 DIAGNOSIS — Z9181 History of falling: Secondary | ICD-10-CM | POA: Diagnosis not present

## 2019-05-29 DIAGNOSIS — Z85828 Personal history of other malignant neoplasm of skin: Secondary | ICD-10-CM | POA: Diagnosis not present

## 2019-05-29 DIAGNOSIS — I872 Venous insufficiency (chronic) (peripheral): Secondary | ICD-10-CM | POA: Diagnosis not present

## 2019-05-29 DIAGNOSIS — I499 Cardiac arrhythmia, unspecified: Secondary | ICD-10-CM | POA: Diagnosis not present

## 2019-05-29 DIAGNOSIS — M170 Bilateral primary osteoarthritis of knee: Secondary | ICD-10-CM | POA: Diagnosis not present

## 2019-05-29 DIAGNOSIS — L97221 Non-pressure chronic ulcer of left calf limited to breakdown of skin: Secondary | ICD-10-CM | POA: Diagnosis not present

## 2019-05-29 DIAGNOSIS — E039 Hypothyroidism, unspecified: Secondary | ICD-10-CM | POA: Diagnosis not present

## 2019-05-29 DIAGNOSIS — I4891 Unspecified atrial fibrillation: Secondary | ICD-10-CM | POA: Diagnosis not present

## 2019-05-29 DIAGNOSIS — D509 Iron deficiency anemia, unspecified: Secondary | ICD-10-CM | POA: Diagnosis not present

## 2019-05-29 DIAGNOSIS — L03115 Cellulitis of right lower limb: Secondary | ICD-10-CM | POA: Diagnosis not present

## 2019-05-29 DIAGNOSIS — Z7982 Long term (current) use of aspirin: Secondary | ICD-10-CM | POA: Diagnosis not present

## 2019-05-29 NOTE — Telephone Encounter (Signed)
Spoke with caregiver this AM and she states that her BP Saturday was 122/78 and on Sunday morning it was 126/86 and later in the day it was 128/68. She also states that her pulse rate has run good all weekend in the 70s and her pulse ox has stayed in the 90s all weekend.

## 2019-05-29 NOTE — Telephone Encounter (Signed)
Does appear to be good readings.  Therefore would like to continue where she is and just plan to recheck as discussed before

## 2019-05-29 NOTE — Telephone Encounter (Signed)
Caregiver aware and understands

## 2019-05-29 NOTE — Telephone Encounter (Signed)
Separate the dose and go ahead and lower lisinopril to 5 mg, take 1/2 tab

## 2019-05-29 NOTE — Telephone Encounter (Signed)
Per caregiver, when she takes both the lisinopril and metoprolol she staggers. She held both pills yesterday and pt did fine. She she give one in am and one in pm? Feels like it is making her dizzy

## 2019-06-01 DIAGNOSIS — L97811 Non-pressure chronic ulcer of other part of right lower leg limited to breakdown of skin: Secondary | ICD-10-CM | POA: Diagnosis not present

## 2019-06-01 DIAGNOSIS — E559 Vitamin D deficiency, unspecified: Secondary | ICD-10-CM | POA: Diagnosis not present

## 2019-06-01 DIAGNOSIS — E039 Hypothyroidism, unspecified: Secondary | ICD-10-CM | POA: Diagnosis not present

## 2019-06-01 DIAGNOSIS — I872 Venous insufficiency (chronic) (peripheral): Secondary | ICD-10-CM | POA: Diagnosis not present

## 2019-06-01 DIAGNOSIS — Z9181 History of falling: Secondary | ICD-10-CM | POA: Diagnosis not present

## 2019-06-01 DIAGNOSIS — L03115 Cellulitis of right lower limb: Secondary | ICD-10-CM | POA: Diagnosis not present

## 2019-06-01 DIAGNOSIS — I4891 Unspecified atrial fibrillation: Secondary | ICD-10-CM | POA: Diagnosis not present

## 2019-06-01 DIAGNOSIS — D509 Iron deficiency anemia, unspecified: Secondary | ICD-10-CM | POA: Diagnosis not present

## 2019-06-01 DIAGNOSIS — M170 Bilateral primary osteoarthritis of knee: Secondary | ICD-10-CM | POA: Diagnosis not present

## 2019-06-01 DIAGNOSIS — Z96649 Presence of unspecified artificial hip joint: Secondary | ICD-10-CM | POA: Diagnosis not present

## 2019-06-01 DIAGNOSIS — K219 Gastro-esophageal reflux disease without esophagitis: Secondary | ICD-10-CM | POA: Diagnosis not present

## 2019-06-01 DIAGNOSIS — I10 Essential (primary) hypertension: Secondary | ICD-10-CM | POA: Diagnosis not present

## 2019-06-01 DIAGNOSIS — Z85828 Personal history of other malignant neoplasm of skin: Secondary | ICD-10-CM | POA: Diagnosis not present

## 2019-06-01 DIAGNOSIS — I499 Cardiac arrhythmia, unspecified: Secondary | ICD-10-CM | POA: Diagnosis not present

## 2019-06-01 DIAGNOSIS — Z7982 Long term (current) use of aspirin: Secondary | ICD-10-CM | POA: Diagnosis not present

## 2019-06-01 DIAGNOSIS — L97221 Non-pressure chronic ulcer of left calf limited to breakdown of skin: Secondary | ICD-10-CM | POA: Diagnosis not present

## 2019-06-05 DIAGNOSIS — L97811 Non-pressure chronic ulcer of other part of right lower leg limited to breakdown of skin: Secondary | ICD-10-CM | POA: Diagnosis not present

## 2019-06-05 DIAGNOSIS — I4891 Unspecified atrial fibrillation: Secondary | ICD-10-CM | POA: Diagnosis not present

## 2019-06-05 DIAGNOSIS — M170 Bilateral primary osteoarthritis of knee: Secondary | ICD-10-CM | POA: Diagnosis not present

## 2019-06-05 DIAGNOSIS — K219 Gastro-esophageal reflux disease without esophagitis: Secondary | ICD-10-CM | POA: Diagnosis not present

## 2019-06-05 DIAGNOSIS — I10 Essential (primary) hypertension: Secondary | ICD-10-CM | POA: Diagnosis not present

## 2019-06-05 DIAGNOSIS — Z85828 Personal history of other malignant neoplasm of skin: Secondary | ICD-10-CM | POA: Diagnosis not present

## 2019-06-05 DIAGNOSIS — D509 Iron deficiency anemia, unspecified: Secondary | ICD-10-CM | POA: Diagnosis not present

## 2019-06-05 DIAGNOSIS — I872 Venous insufficiency (chronic) (peripheral): Secondary | ICD-10-CM | POA: Diagnosis not present

## 2019-06-05 DIAGNOSIS — E039 Hypothyroidism, unspecified: Secondary | ICD-10-CM | POA: Diagnosis not present

## 2019-06-05 DIAGNOSIS — E559 Vitamin D deficiency, unspecified: Secondary | ICD-10-CM | POA: Diagnosis not present

## 2019-06-05 DIAGNOSIS — Z7982 Long term (current) use of aspirin: Secondary | ICD-10-CM | POA: Diagnosis not present

## 2019-06-05 DIAGNOSIS — Z9181 History of falling: Secondary | ICD-10-CM | POA: Diagnosis not present

## 2019-06-05 DIAGNOSIS — L03115 Cellulitis of right lower limb: Secondary | ICD-10-CM | POA: Diagnosis not present

## 2019-06-05 DIAGNOSIS — L97221 Non-pressure chronic ulcer of left calf limited to breakdown of skin: Secondary | ICD-10-CM | POA: Diagnosis not present

## 2019-06-05 DIAGNOSIS — I499 Cardiac arrhythmia, unspecified: Secondary | ICD-10-CM | POA: Diagnosis not present

## 2019-06-05 DIAGNOSIS — Z96649 Presence of unspecified artificial hip joint: Secondary | ICD-10-CM | POA: Diagnosis not present

## 2019-06-07 ENCOUNTER — Ambulatory Visit (INDEPENDENT_AMBULATORY_CARE_PROVIDER_SITE_OTHER): Payer: Medicare Other | Admitting: Physician Assistant

## 2019-06-07 ENCOUNTER — Encounter: Payer: Self-pay | Admitting: Physician Assistant

## 2019-06-07 ENCOUNTER — Other Ambulatory Visit: Payer: Self-pay

## 2019-06-07 DIAGNOSIS — F688 Other specified disorders of adult personality and behavior: Secondary | ICD-10-CM

## 2019-06-07 DIAGNOSIS — R5383 Other fatigue: Secondary | ICD-10-CM

## 2019-06-07 DIAGNOSIS — Z9181 History of falling: Secondary | ICD-10-CM | POA: Diagnosis not present

## 2019-06-07 DIAGNOSIS — R410 Disorientation, unspecified: Secondary | ICD-10-CM

## 2019-06-07 DIAGNOSIS — N3944 Nocturnal enuresis: Secondary | ICD-10-CM

## 2019-06-07 DIAGNOSIS — M6281 Muscle weakness (generalized): Secondary | ICD-10-CM

## 2019-06-07 LAB — URINALYSIS, COMPLETE
Bilirubin, UA: NEGATIVE
Glucose, UA: NEGATIVE
Leukocytes,UA: NEGATIVE
Nitrite, UA: NEGATIVE
Protein,UA: NEGATIVE
RBC, UA: NEGATIVE
Specific Gravity, UA: 1.025 (ref 1.005–1.030)
Urobilinogen, Ur: 0.2 mg/dL (ref 0.2–1.0)
pH, UA: 5.5 (ref 5.0–7.5)

## 2019-06-07 LAB — MICROSCOPIC EXAMINATION

## 2019-06-08 DIAGNOSIS — Z85828 Personal history of other malignant neoplasm of skin: Secondary | ICD-10-CM | POA: Diagnosis not present

## 2019-06-08 DIAGNOSIS — D509 Iron deficiency anemia, unspecified: Secondary | ICD-10-CM | POA: Diagnosis not present

## 2019-06-08 DIAGNOSIS — K219 Gastro-esophageal reflux disease without esophagitis: Secondary | ICD-10-CM | POA: Diagnosis not present

## 2019-06-08 DIAGNOSIS — L97221 Non-pressure chronic ulcer of left calf limited to breakdown of skin: Secondary | ICD-10-CM | POA: Diagnosis not present

## 2019-06-08 DIAGNOSIS — M170 Bilateral primary osteoarthritis of knee: Secondary | ICD-10-CM | POA: Diagnosis not present

## 2019-06-08 DIAGNOSIS — I4891 Unspecified atrial fibrillation: Secondary | ICD-10-CM | POA: Diagnosis not present

## 2019-06-08 DIAGNOSIS — I10 Essential (primary) hypertension: Secondary | ICD-10-CM | POA: Diagnosis not present

## 2019-06-08 DIAGNOSIS — Z9181 History of falling: Secondary | ICD-10-CM | POA: Diagnosis not present

## 2019-06-08 DIAGNOSIS — E559 Vitamin D deficiency, unspecified: Secondary | ICD-10-CM | POA: Diagnosis not present

## 2019-06-08 DIAGNOSIS — I872 Venous insufficiency (chronic) (peripheral): Secondary | ICD-10-CM | POA: Diagnosis not present

## 2019-06-08 DIAGNOSIS — E039 Hypothyroidism, unspecified: Secondary | ICD-10-CM | POA: Diagnosis not present

## 2019-06-08 DIAGNOSIS — I499 Cardiac arrhythmia, unspecified: Secondary | ICD-10-CM | POA: Diagnosis not present

## 2019-06-08 DIAGNOSIS — L03115 Cellulitis of right lower limb: Secondary | ICD-10-CM | POA: Diagnosis not present

## 2019-06-08 DIAGNOSIS — Z96649 Presence of unspecified artificial hip joint: Secondary | ICD-10-CM | POA: Diagnosis not present

## 2019-06-08 DIAGNOSIS — Z7982 Long term (current) use of aspirin: Secondary | ICD-10-CM | POA: Diagnosis not present

## 2019-06-08 DIAGNOSIS — L97811 Non-pressure chronic ulcer of other part of right lower leg limited to breakdown of skin: Secondary | ICD-10-CM | POA: Diagnosis not present

## 2019-06-08 NOTE — Progress Notes (Signed)
Telephone visit  Subjective: CC: Weakness and confusion PCP: Terald Sleeper, PA-C Elaine Kennedy is a 85 y.o. female calls for telephone consult today. Patient provides verbal consent for consult held via phone.  Patient is identified with 2 separate identifiers.  At this time the entire area is on COVID-19 social distancing and stay home orders are in place.  Patient is of higher risk and therefore we are performing this by a virtual method.  Location of patient: home Location of provider: HOME Others present for call: daughter, caretaker   The patient is accompanied by her daughter for a telephone visit.  She reports that she is not wanting to walk.  She normally does not want to get up very much but it is worse than normal.  She feels very weak and tired.  She had enuresis last night in the bed and this is something that she never does.  In recent blood pressure readings she has had blood pressures as low as 94/75 up to 143/75.  We had started lisinopril couple of weeks ago.  Working to stop that now in case she is experiencing some hypotensive symptoms.  She is still under health [facility] with [facility].  She will continue her metoprolol at the regular dosing.  We will also have her have labs and urine check.  ROS: Per HPI  Allergies  Allergen Reactions  . Sulfa Antibiotics Diarrhea   Past Medical History:  Diagnosis Date  . Arthritis   . Cancer ([place])    skin cancer on head.  [name] Dysrhythmia    AFib  . GERD (gastroesophageal reflux disease)   . Hypothyroidism     Current Outpatient Medications:  .  acetaminophen (TYLENOL) 650 MG CR tablet, Take 650 mg by mouth every 8 (eight) hours as needed for pain. , Disp: , Rfl:  .  aspirin EC 81 MG tablet, Take 81 mg by mouth daily., Disp: , Rfl:  .  cholecalciferol (VITAMIN D) 1000 units tablet, Take 1,000 Units by mouth daily., Disp: , Rfl:  .  diclofenac sodium (VOLTAREN) 1 % GEL, Apply 4 g topically 4 (four) times daily. FOR KNEE  arthritis, Disp: 200 g, Rfl: 11 .  ferrous sulfate 325 (65 FE) MG tablet, Take 325 mg by mouth daily with breakfast., Disp: , Rfl:  .  levothyroxine (SYNTHROID) 88 MCG tablet, TAKE ONE TABLET (88 MCG TOTAL) BY MOUTH DAILY., Disp: 90 tablet, Rfl: 1 .  metoprolol succinate (TOPROL-XL) 50 MG 24 hr tablet, Take 1 tablet (50 mg total) by mouth daily., Disp: 30 tablet, Rfl: 5 .  Multiple Vitamins-Minerals (MULTIVITAMIN WITH MINERALS) tablet, Take 1 tablet by mouth daily. Spectra-vite, Disp: , Rfl:  .  omeprazole (PRILOSEC) 40 MG capsule, Take 1 capsule (40 mg total) by mouth daily., Disp: 90 capsule, Rfl: 1 .  Polyethylene Glycol 400 (BLINK TEARS OP), Place 1 drop into both eyes 3 (three) times daily as needed (dry eyes)., Disp: , Rfl:   Assessment/ Plan: 85 y.o. female   1. Enuresis, nocturnal only - Urine Culture; Future - Urinalysis, Complete; Future - Urinalysis, Complete - Urine Culture - Microscopic Examination  2. Fatigue, unspecified type - Urine Culture; Future - Urinalysis, Complete; Future - Urinalysis, Complete - Urine Culture - Microscopic Examination  3. Confusion HOLD lisinopril for now Monitor blood pressure Labs, home health  4. Personality change See above  5. At high risk for falls PT and OT  6. Muscle weakness (generalized) labs   Return  in about 4 weeks (around 07/05/2019).  Continue all other maintenance medications as listed above.  Start time: :1:43 PM End time: 1:54 PM  No orders of the defined types were placed in this encounter.   Particia Nearing PA-C [place] [phone]

## 2019-06-09 ENCOUNTER — Other Ambulatory Visit: Payer: Self-pay | Admitting: Physician Assistant

## 2019-06-09 ENCOUNTER — Telehealth: Payer: Self-pay | Admitting: Physician Assistant

## 2019-06-09 DIAGNOSIS — Z7982 Long term (current) use of aspirin: Secondary | ICD-10-CM | POA: Diagnosis not present

## 2019-06-09 DIAGNOSIS — M170 Bilateral primary osteoarthritis of knee: Secondary | ICD-10-CM | POA: Diagnosis not present

## 2019-06-09 DIAGNOSIS — E039 Hypothyroidism, unspecified: Secondary | ICD-10-CM | POA: Diagnosis not present

## 2019-06-09 DIAGNOSIS — Z85828 Personal history of other malignant neoplasm of skin: Secondary | ICD-10-CM | POA: Diagnosis not present

## 2019-06-09 DIAGNOSIS — D509 Iron deficiency anemia, unspecified: Secondary | ICD-10-CM | POA: Diagnosis not present

## 2019-06-09 DIAGNOSIS — L97811 Non-pressure chronic ulcer of other part of right lower leg limited to breakdown of skin: Secondary | ICD-10-CM | POA: Diagnosis not present

## 2019-06-09 DIAGNOSIS — K219 Gastro-esophageal reflux disease without esophagitis: Secondary | ICD-10-CM | POA: Diagnosis not present

## 2019-06-09 DIAGNOSIS — Z96649 Presence of unspecified artificial hip joint: Secondary | ICD-10-CM | POA: Diagnosis not present

## 2019-06-09 DIAGNOSIS — L03115 Cellulitis of right lower limb: Secondary | ICD-10-CM | POA: Diagnosis not present

## 2019-06-09 DIAGNOSIS — I499 Cardiac arrhythmia, unspecified: Secondary | ICD-10-CM | POA: Diagnosis not present

## 2019-06-09 DIAGNOSIS — L97221 Non-pressure chronic ulcer of left calf limited to breakdown of skin: Secondary | ICD-10-CM | POA: Diagnosis not present

## 2019-06-09 DIAGNOSIS — E559 Vitamin D deficiency, unspecified: Secondary | ICD-10-CM | POA: Diagnosis not present

## 2019-06-09 DIAGNOSIS — Z9181 History of falling: Secondary | ICD-10-CM | POA: Diagnosis not present

## 2019-06-09 DIAGNOSIS — I872 Venous insufficiency (chronic) (peripheral): Secondary | ICD-10-CM | POA: Diagnosis not present

## 2019-06-09 DIAGNOSIS — I4891 Unspecified atrial fibrillation: Secondary | ICD-10-CM | POA: Diagnosis not present

## 2019-06-09 DIAGNOSIS — I10 Essential (primary) hypertension: Secondary | ICD-10-CM | POA: Diagnosis not present

## 2019-06-09 LAB — URINE CULTURE

## 2019-06-09 MED ORDER — PREDNISONE 10 MG (21) PO TBPK: ORAL_TABLET | ORAL | 0 refills | Status: DC

## 2019-06-09 NOTE — Telephone Encounter (Signed)
With her age and recent rate of confusion I am not very comfortable using a muscle relaxer.  You can use muscle rub to the area.  And someone can try to massage her neck particularly after putting heat on it.  I will send in a pack of prednisone.  And hopefully this will calm down her neck and the knees.

## 2019-06-09 NOTE — Telephone Encounter (Signed)
Pt daughter aware

## 2019-06-09 NOTE — Progress Notes (Signed)
terare

## 2019-06-12 DIAGNOSIS — I10 Essential (primary) hypertension: Secondary | ICD-10-CM | POA: Diagnosis not present

## 2019-06-12 DIAGNOSIS — Z9181 History of falling: Secondary | ICD-10-CM | POA: Diagnosis not present

## 2019-06-12 DIAGNOSIS — E559 Vitamin D deficiency, unspecified: Secondary | ICD-10-CM | POA: Diagnosis not present

## 2019-06-12 DIAGNOSIS — D509 Iron deficiency anemia, unspecified: Secondary | ICD-10-CM | POA: Diagnosis not present

## 2019-06-12 DIAGNOSIS — I872 Venous insufficiency (chronic) (peripheral): Secondary | ICD-10-CM | POA: Diagnosis not present

## 2019-06-12 DIAGNOSIS — S79911A Unspecified injury of right hip, initial encounter: Secondary | ICD-10-CM | POA: Diagnosis not present

## 2019-06-12 DIAGNOSIS — Z96642 Presence of left artificial hip joint: Secondary | ICD-10-CM | POA: Insufficient documentation

## 2019-06-12 DIAGNOSIS — K219 Gastro-esophageal reflux disease without esophagitis: Secondary | ICD-10-CM | POA: Diagnosis not present

## 2019-06-12 DIAGNOSIS — I499 Cardiac arrhythmia, unspecified: Secondary | ICD-10-CM | POA: Diagnosis not present

## 2019-06-12 DIAGNOSIS — L97221 Non-pressure chronic ulcer of left calf limited to breakdown of skin: Secondary | ICD-10-CM | POA: Diagnosis not present

## 2019-06-12 DIAGNOSIS — E039 Hypothyroidism, unspecified: Secondary | ICD-10-CM | POA: Diagnosis not present

## 2019-06-12 DIAGNOSIS — L03115 Cellulitis of right lower limb: Secondary | ICD-10-CM | POA: Diagnosis not present

## 2019-06-12 DIAGNOSIS — I4891 Unspecified atrial fibrillation: Secondary | ICD-10-CM | POA: Diagnosis not present

## 2019-06-12 DIAGNOSIS — M25551 Pain in right hip: Secondary | ICD-10-CM | POA: Diagnosis not present

## 2019-06-12 DIAGNOSIS — M25552 Pain in left hip: Secondary | ICD-10-CM | POA: Diagnosis not present

## 2019-06-12 DIAGNOSIS — S79912A Unspecified injury of left hip, initial encounter: Secondary | ICD-10-CM | POA: Diagnosis not present

## 2019-06-12 DIAGNOSIS — Z96649 Presence of unspecified artificial hip joint: Secondary | ICD-10-CM | POA: Diagnosis not present

## 2019-06-12 DIAGNOSIS — Z85828 Personal history of other malignant neoplasm of skin: Secondary | ICD-10-CM | POA: Diagnosis not present

## 2019-06-12 DIAGNOSIS — L97811 Non-pressure chronic ulcer of other part of right lower leg limited to breakdown of skin: Secondary | ICD-10-CM | POA: Diagnosis not present

## 2019-06-12 DIAGNOSIS — M170 Bilateral primary osteoarthritis of knee: Secondary | ICD-10-CM | POA: Diagnosis not present

## 2019-06-12 DIAGNOSIS — Z7982 Long term (current) use of aspirin: Secondary | ICD-10-CM | POA: Diagnosis not present

## 2019-06-14 ENCOUNTER — Other Ambulatory Visit: Payer: Self-pay | Admitting: Physician Assistant

## 2019-06-14 DIAGNOSIS — D509 Iron deficiency anemia, unspecified: Secondary | ICD-10-CM | POA: Diagnosis not present

## 2019-06-14 DIAGNOSIS — L03115 Cellulitis of right lower limb: Secondary | ICD-10-CM | POA: Diagnosis not present

## 2019-06-14 DIAGNOSIS — I10 Essential (primary) hypertension: Secondary | ICD-10-CM | POA: Diagnosis not present

## 2019-06-14 DIAGNOSIS — I499 Cardiac arrhythmia, unspecified: Secondary | ICD-10-CM | POA: Diagnosis not present

## 2019-06-14 DIAGNOSIS — Z8679 Personal history of other diseases of the circulatory system: Secondary | ICD-10-CM

## 2019-06-14 DIAGNOSIS — E559 Vitamin D deficiency, unspecified: Secondary | ICD-10-CM | POA: Diagnosis not present

## 2019-06-14 DIAGNOSIS — L97221 Non-pressure chronic ulcer of left calf limited to breakdown of skin: Secondary | ICD-10-CM | POA: Diagnosis not present

## 2019-06-14 DIAGNOSIS — Z85828 Personal history of other malignant neoplasm of skin: Secondary | ICD-10-CM | POA: Diagnosis not present

## 2019-06-14 DIAGNOSIS — Z7982 Long term (current) use of aspirin: Secondary | ICD-10-CM | POA: Diagnosis not present

## 2019-06-14 DIAGNOSIS — Z96649 Presence of unspecified artificial hip joint: Secondary | ICD-10-CM | POA: Diagnosis not present

## 2019-06-14 DIAGNOSIS — I4891 Unspecified atrial fibrillation: Secondary | ICD-10-CM | POA: Diagnosis not present

## 2019-06-14 DIAGNOSIS — Z9181 History of falling: Secondary | ICD-10-CM | POA: Diagnosis not present

## 2019-06-14 DIAGNOSIS — K219 Gastro-esophageal reflux disease without esophagitis: Secondary | ICD-10-CM | POA: Diagnosis not present

## 2019-06-14 DIAGNOSIS — L97811 Non-pressure chronic ulcer of other part of right lower leg limited to breakdown of skin: Secondary | ICD-10-CM | POA: Diagnosis not present

## 2019-06-14 DIAGNOSIS — M170 Bilateral primary osteoarthritis of knee: Secondary | ICD-10-CM | POA: Diagnosis not present

## 2019-06-14 DIAGNOSIS — I872 Venous insufficiency (chronic) (peripheral): Secondary | ICD-10-CM | POA: Diagnosis not present

## 2019-06-14 DIAGNOSIS — E039 Hypothyroidism, unspecified: Secondary | ICD-10-CM | POA: Diagnosis not present

## 2019-06-15 ENCOUNTER — Telehealth: Payer: Self-pay | Admitting: Physician Assistant

## 2019-06-15 DIAGNOSIS — Z85828 Personal history of other malignant neoplasm of skin: Secondary | ICD-10-CM | POA: Diagnosis not present

## 2019-06-15 DIAGNOSIS — E559 Vitamin D deficiency, unspecified: Secondary | ICD-10-CM | POA: Diagnosis not present

## 2019-06-15 DIAGNOSIS — L97811 Non-pressure chronic ulcer of other part of right lower leg limited to breakdown of skin: Secondary | ICD-10-CM | POA: Diagnosis not present

## 2019-06-15 DIAGNOSIS — Z7982 Long term (current) use of aspirin: Secondary | ICD-10-CM | POA: Diagnosis not present

## 2019-06-15 DIAGNOSIS — I872 Venous insufficiency (chronic) (peripheral): Secondary | ICD-10-CM | POA: Diagnosis not present

## 2019-06-15 DIAGNOSIS — K219 Gastro-esophageal reflux disease without esophagitis: Secondary | ICD-10-CM | POA: Diagnosis not present

## 2019-06-15 DIAGNOSIS — I10 Essential (primary) hypertension: Secondary | ICD-10-CM | POA: Diagnosis not present

## 2019-06-15 DIAGNOSIS — I499 Cardiac arrhythmia, unspecified: Secondary | ICD-10-CM | POA: Diagnosis not present

## 2019-06-15 DIAGNOSIS — L03115 Cellulitis of right lower limb: Secondary | ICD-10-CM | POA: Diagnosis not present

## 2019-06-15 DIAGNOSIS — M170 Bilateral primary osteoarthritis of knee: Secondary | ICD-10-CM | POA: Diagnosis not present

## 2019-06-15 DIAGNOSIS — I4891 Unspecified atrial fibrillation: Secondary | ICD-10-CM | POA: Diagnosis not present

## 2019-06-15 DIAGNOSIS — D509 Iron deficiency anemia, unspecified: Secondary | ICD-10-CM | POA: Diagnosis not present

## 2019-06-15 DIAGNOSIS — L97221 Non-pressure chronic ulcer of left calf limited to breakdown of skin: Secondary | ICD-10-CM | POA: Diagnosis not present

## 2019-06-15 DIAGNOSIS — E039 Hypothyroidism, unspecified: Secondary | ICD-10-CM | POA: Diagnosis not present

## 2019-06-15 DIAGNOSIS — Z96649 Presence of unspecified artificial hip joint: Secondary | ICD-10-CM | POA: Diagnosis not present

## 2019-06-15 DIAGNOSIS — Z9181 History of falling: Secondary | ICD-10-CM | POA: Diagnosis not present

## 2019-06-15 NOTE — Telephone Encounter (Signed)
Returned call. Spoke with daughter Bethena Roys and she states that patient wet the bed lastnight and her BS was 270 this morning.  While her BS was elevated daughter states that she was difficult to get out of the bed and was hard to understand her while talking.  States patient was acting groggy.  Was able to get her out of the bed and states she is now up eating in a wheelchair .  Bethena Roys states that she was about to get patient to walk some but she did get tired.  While on the phone she asked patient how she felt and she states she feels fine now.  Her BS was last checked at 1:40pm and states it was still 270. Patient has been on prednisone since Sunday.  Would like to know if she should continue with it?  Covering PCP- please advise

## 2019-06-15 NOTE — Telephone Encounter (Signed)
lmtcb

## 2019-06-15 NOTE — Telephone Encounter (Signed)
Just continue to keep a check of her blood sugar

## 2019-06-16 ENCOUNTER — Encounter: Payer: Self-pay | Admitting: Physician Assistant

## 2019-06-16 ENCOUNTER — Other Ambulatory Visit: Payer: Self-pay

## 2019-06-16 ENCOUNTER — Ambulatory Visit (INDEPENDENT_AMBULATORY_CARE_PROVIDER_SITE_OTHER): Payer: Medicare Other | Admitting: Physician Assistant

## 2019-06-16 VITALS — BP 155/90 | HR 91 | Temp 96.4°F | Ht 63.0 in

## 2019-06-16 DIAGNOSIS — N3944 Nocturnal enuresis: Secondary | ICD-10-CM | POA: Diagnosis not present

## 2019-06-16 DIAGNOSIS — F0391 Unspecified dementia with behavioral disturbance: Secondary | ICD-10-CM

## 2019-06-16 DIAGNOSIS — D509 Iron deficiency anemia, unspecified: Secondary | ICD-10-CM | POA: Diagnosis not present

## 2019-06-16 DIAGNOSIS — K219 Gastro-esophageal reflux disease without esophagitis: Secondary | ICD-10-CM | POA: Diagnosis not present

## 2019-06-16 DIAGNOSIS — Z23 Encounter for immunization: Secondary | ICD-10-CM

## 2019-06-16 DIAGNOSIS — Z85828 Personal history of other malignant neoplasm of skin: Secondary | ICD-10-CM | POA: Diagnosis not present

## 2019-06-16 DIAGNOSIS — I10 Essential (primary) hypertension: Secondary | ICD-10-CM | POA: Diagnosis not present

## 2019-06-16 DIAGNOSIS — L97221 Non-pressure chronic ulcer of left calf limited to breakdown of skin: Secondary | ICD-10-CM | POA: Diagnosis not present

## 2019-06-16 DIAGNOSIS — E559 Vitamin D deficiency, unspecified: Secondary | ICD-10-CM | POA: Diagnosis not present

## 2019-06-16 DIAGNOSIS — L97811 Non-pressure chronic ulcer of other part of right lower leg limited to breakdown of skin: Secondary | ICD-10-CM | POA: Diagnosis not present

## 2019-06-16 DIAGNOSIS — M170 Bilateral primary osteoarthritis of knee: Secondary | ICD-10-CM | POA: Diagnosis not present

## 2019-06-16 DIAGNOSIS — R739 Hyperglycemia, unspecified: Secondary | ICD-10-CM | POA: Diagnosis not present

## 2019-06-16 DIAGNOSIS — E039 Hypothyroidism, unspecified: Secondary | ICD-10-CM | POA: Diagnosis not present

## 2019-06-16 DIAGNOSIS — M542 Cervicalgia: Secondary | ICD-10-CM

## 2019-06-16 DIAGNOSIS — I872 Venous insufficiency (chronic) (peripheral): Secondary | ICD-10-CM | POA: Diagnosis not present

## 2019-06-16 DIAGNOSIS — Z9181 History of falling: Secondary | ICD-10-CM | POA: Diagnosis not present

## 2019-06-16 DIAGNOSIS — I4891 Unspecified atrial fibrillation: Secondary | ICD-10-CM | POA: Diagnosis not present

## 2019-06-16 DIAGNOSIS — L03115 Cellulitis of right lower limb: Secondary | ICD-10-CM | POA: Diagnosis not present

## 2019-06-16 DIAGNOSIS — R410 Disorientation, unspecified: Secondary | ICD-10-CM

## 2019-06-16 DIAGNOSIS — I499 Cardiac arrhythmia, unspecified: Secondary | ICD-10-CM | POA: Diagnosis not present

## 2019-06-16 DIAGNOSIS — Z7982 Long term (current) use of aspirin: Secondary | ICD-10-CM | POA: Diagnosis not present

## 2019-06-16 DIAGNOSIS — Z96649 Presence of unspecified artificial hip joint: Secondary | ICD-10-CM | POA: Diagnosis not present

## 2019-06-16 LAB — BAYER DCA HB A1C WAIVED: HB A1C (BAYER DCA - WAIVED): 5.9 % (ref <7.0)

## 2019-06-16 MED ORDER — BACLOFEN 5 MG PO TABS: 5.0000 mg | ORAL_TABLET | Freq: Three times a day (TID) | ORAL | 0 refills | Status: AC | PRN

## 2019-06-16 NOTE — Patient Instructions (Signed)
Dementia Caregiver Guide Dementia is a term used to describe a number of symptoms that affect memory and thinking. The most common symptoms include:  Memory loss.  Trouble with language and communication.  Trouble concentrating.  Poor judgment.  Problems with reasoning.  Child-like behavior and language.  Extreme anxiety.  Angry outbursts.  Wandering from home or public places. Dementia usually gets worse slowly over time. In the early stages, people with dementia can stay independent and safe with some help. In later stages, they need help with daily tasks such as dressing, grooming, and using the bathroom. How to help the person with dementia cope Dementia can be frightening and confusing. Here are some tips to help the person with dementia cope with changes caused by the disease. General tips  Keep the person on track with his or her routine.  Try to identify areas where the person may need help.  Be supportive, patient, calm, and encouraging.  Gently remind the person that adjusting to changes takes time.  Help with the tasks that the person has asked for help with.  Keep the person involved in daily tasks and decisions as much as possible.  Encourage conversation, but try not to get frustrated or harried if the person struggles to find words or does not seem to appreciate your help. Communication tips  When the person is talking or seems frustrated, make eye contact and hold the person's hand.  Ask specific questions that need yes or no answers.  Use simple words, short sentences, and a calm voice. Only give one direction at a time.  When offering choices, limit them to just 1 or 2.  Avoid correcting the person in a negative way.  If the person is struggling to find the right words, gently try to help him or her. How to recognize symptoms of stress Symptoms of stress in caregivers include:  Feeling frustrated or angry with the person with dementia.   Denying that the person has dementia or that his or her symptoms will not improve.  Feeling hopeless and unappreciated.  Difficulty sleeping.  Difficulty concentrating.  Feeling anxious, irritable, or depressed.  Developing stress-related health problems.  Feeling like you have too little time for your own life. Follow these instructions at home:   Make sure that you and the person you are caring for: ? Get regular sleep. ? Exercise regularly. ? Eat regular, nutritious meals. ? Drink enough fluid to keep your urine clear or pale yellow. ? Take over-the-counter and prescription medicines only as told by your health care providers. ? Attend all scheduled health care appointments.  Join a support group with others who are caregivers.  Ask about respite care resources so that you can have a regular break from the stress of caregiving.  Look for signs of stress in yourself and in the person you are caring for. If you notice signs of stress, take steps to manage it.  Consider any safety risks and take steps to avoid them.  Organize medications in a pill box for each day of the week.  Create a plan to handle any legal or financial matters. Get legal or financial advice if needed.  Keep a calendar in a central location to remind the person of appointments or other activities. Tips for reducing the risk of injury  Keep floors clear of clutter. Remove rugs, magazine racks, and floor lamps.  Keep hallways well lit, especially at night.  Put a handrail and nonslip mat in the bathtub or   shower.  Put childproof locks on cabinets that contain dangerous items, such as medicines, alcohol, guns, toxic cleaning items, sharp tools or utensils, matches, and lighters.  Put the locks in places where the person cannot see or reach them easily. This will help ensure that the person does not wander out of the house and get lost.  Be prepared for emergencies. Keep a list of emergency phone  numbers and addresses in a convenient area.  Remove car keys and lock garage doors so that the person does not try to get in the car and drive.  Have the person wear a bracelet that tracks locations and identifies the person as having memory problems. This should be worn at all times for safety. Where to find support: Many individuals and organizations offer support. These include:  Support groups for people with dementia and for caregivers.  Counselors or therapists.  Home health care services.  Adult day care centers. Where to find more information Alzheimer's Association: www.alz.org Contact a health care provider if:  The person's health is rapidly getting worse.  You are no longer able to care for the person.  Caring for the person is affecting your physical and emotional health.  The person threatens himself or herself, you, or anyone else. Summary  Dementia is a term used to describe a number of symptoms that affect memory and thinking.  Dementia usually gets worse slowly over time.  Take steps to reduce the person's risk of injury, and to plan for future care.  Caregivers need support, relief from caregiving, and time for their own lives. This information is not intended to replace advice given to you by your health care provider. Make sure you discuss any questions you have with your health care provider. Document Released: 07/07/2016 Document Revised: 07/16/2017 Document Reviewed: 07/07/2016 Elsevier Patient Education  2020 Elsevier Inc.  

## 2019-06-17 DIAGNOSIS — S199XXA Unspecified injury of neck, initial encounter: Secondary | ICD-10-CM | POA: Diagnosis not present

## 2019-06-17 DIAGNOSIS — S0990XA Unspecified injury of head, initial encounter: Secondary | ICD-10-CM | POA: Diagnosis not present

## 2019-06-17 DIAGNOSIS — R519 Headache, unspecified: Secondary | ICD-10-CM | POA: Diagnosis not present

## 2019-06-17 DIAGNOSIS — K219 Gastro-esophageal reflux disease without esophagitis: Secondary | ICD-10-CM | POA: Diagnosis not present

## 2019-06-17 DIAGNOSIS — W19XXXA Unspecified fall, initial encounter: Secondary | ICD-10-CM | POA: Diagnosis not present

## 2019-06-17 DIAGNOSIS — Z79899 Other long term (current) drug therapy: Secondary | ICD-10-CM | POA: Diagnosis not present

## 2019-06-17 DIAGNOSIS — S3993XA Unspecified injury of pelvis, initial encounter: Secondary | ICD-10-CM | POA: Diagnosis not present

## 2019-06-17 DIAGNOSIS — Z7982 Long term (current) use of aspirin: Secondary | ICD-10-CM | POA: Diagnosis not present

## 2019-06-17 DIAGNOSIS — I4891 Unspecified atrial fibrillation: Secondary | ICD-10-CM | POA: Diagnosis not present

## 2019-06-17 DIAGNOSIS — R0902 Hypoxemia: Secondary | ICD-10-CM | POA: Diagnosis not present

## 2019-06-17 DIAGNOSIS — R42 Dizziness and giddiness: Secondary | ICD-10-CM | POA: Diagnosis not present

## 2019-06-17 DIAGNOSIS — R220 Localized swelling, mass and lump, head: Secondary | ICD-10-CM | POA: Diagnosis not present

## 2019-06-17 DIAGNOSIS — S299XXA Unspecified injury of thorax, initial encounter: Secondary | ICD-10-CM | POA: Diagnosis not present

## 2019-06-17 DIAGNOSIS — I959 Hypotension, unspecified: Secondary | ICD-10-CM | POA: Diagnosis not present

## 2019-06-17 DIAGNOSIS — S161XXA Strain of muscle, fascia and tendon at neck level, initial encounter: Secondary | ICD-10-CM | POA: Diagnosis not present

## 2019-06-17 DIAGNOSIS — M542 Cervicalgia: Secondary | ICD-10-CM | POA: Diagnosis not present

## 2019-06-17 DIAGNOSIS — R000 Tachycardia, unspecified: Secondary | ICD-10-CM | POA: Diagnosis not present

## 2019-06-17 DIAGNOSIS — I10 Essential (primary) hypertension: Secondary | ICD-10-CM | POA: Diagnosis not present

## 2019-06-18 DIAGNOSIS — M6281 Muscle weakness (generalized): Secondary | ICD-10-CM | POA: Diagnosis not present

## 2019-06-18 DIAGNOSIS — Z9181 History of falling: Secondary | ICD-10-CM | POA: Diagnosis not present

## 2019-06-19 ENCOUNTER — Encounter: Payer: Self-pay | Admitting: Family

## 2019-06-19 ENCOUNTER — Other Ambulatory Visit: Payer: Self-pay

## 2019-06-19 ENCOUNTER — Ambulatory Visit (INDEPENDENT_AMBULATORY_CARE_PROVIDER_SITE_OTHER): Payer: Medicare Other | Admitting: Family

## 2019-06-19 VITALS — BP 147/75 | HR 87 | Temp 96.7°F | Ht 63.0 in | Wt 142.0 lb

## 2019-06-19 DIAGNOSIS — Y92009 Unspecified place in unspecified non-institutional (private) residence as the place of occurrence of the external cause: Secondary | ICD-10-CM

## 2019-06-19 DIAGNOSIS — S0990XD Unspecified injury of head, subsequent encounter: Secondary | ICD-10-CM | POA: Diagnosis not present

## 2019-06-19 DIAGNOSIS — Z85828 Personal history of other malignant neoplasm of skin: Secondary | ICD-10-CM | POA: Diagnosis not present

## 2019-06-19 DIAGNOSIS — W19XXXA Unspecified fall, initial encounter: Secondary | ICD-10-CM | POA: Diagnosis not present

## 2019-06-19 DIAGNOSIS — I4891 Unspecified atrial fibrillation: Secondary | ICD-10-CM | POA: Diagnosis not present

## 2019-06-19 DIAGNOSIS — Z7982 Long term (current) use of aspirin: Secondary | ICD-10-CM | POA: Diagnosis not present

## 2019-06-19 DIAGNOSIS — E559 Vitamin D deficiency, unspecified: Secondary | ICD-10-CM | POA: Diagnosis not present

## 2019-06-19 DIAGNOSIS — E039 Hypothyroidism, unspecified: Secondary | ICD-10-CM | POA: Diagnosis not present

## 2019-06-19 DIAGNOSIS — L97811 Non-pressure chronic ulcer of other part of right lower leg limited to breakdown of skin: Secondary | ICD-10-CM | POA: Diagnosis not present

## 2019-06-19 DIAGNOSIS — I499 Cardiac arrhythmia, unspecified: Secondary | ICD-10-CM | POA: Diagnosis not present

## 2019-06-19 DIAGNOSIS — D509 Iron deficiency anemia, unspecified: Secondary | ICD-10-CM | POA: Diagnosis not present

## 2019-06-19 DIAGNOSIS — Z09 Encounter for follow-up examination after completed treatment for conditions other than malignant neoplasm: Secondary | ICD-10-CM | POA: Diagnosis not present

## 2019-06-19 DIAGNOSIS — Z96649 Presence of unspecified artificial hip joint: Secondary | ICD-10-CM | POA: Diagnosis not present

## 2019-06-19 DIAGNOSIS — S0990XA Unspecified injury of head, initial encounter: Secondary | ICD-10-CM

## 2019-06-19 DIAGNOSIS — W19XXXD Unspecified fall, subsequent encounter: Secondary | ICD-10-CM

## 2019-06-19 DIAGNOSIS — M170 Bilateral primary osteoarthritis of knee: Secondary | ICD-10-CM | POA: Diagnosis not present

## 2019-06-19 DIAGNOSIS — L97221 Non-pressure chronic ulcer of left calf limited to breakdown of skin: Secondary | ICD-10-CM | POA: Diagnosis not present

## 2019-06-19 DIAGNOSIS — Z9181 History of falling: Secondary | ICD-10-CM | POA: Diagnosis not present

## 2019-06-19 DIAGNOSIS — I10 Essential (primary) hypertension: Secondary | ICD-10-CM | POA: Diagnosis not present

## 2019-06-19 DIAGNOSIS — L03115 Cellulitis of right lower limb: Secondary | ICD-10-CM | POA: Diagnosis not present

## 2019-06-19 DIAGNOSIS — I872 Venous insufficiency (chronic) (peripheral): Secondary | ICD-10-CM | POA: Diagnosis not present

## 2019-06-19 DIAGNOSIS — K219 Gastro-esophageal reflux disease without esophagitis: Secondary | ICD-10-CM | POA: Diagnosis not present

## 2019-06-19 NOTE — Patient Instructions (Signed)

## 2019-06-19 NOTE — Progress Notes (Signed)
BP (!) 155/90   Pulse 91   Temp (!) 96.4 F (35.8 C) (Temporal)   Ht 5\' 3"  (1.6 m)   SpO2 92%   BMI 25.93 kg/m    Subjective:    Patient ID: Elaine Kennedy, female    DOB: [birth year], 85 y.o.   MRN: [id number]  HPI: Elaine Kennedy is a 85 y.o. female presenting on 06/16/2019 for No chief complaint on file.  Patient comes in for recheck on her chronic medical conditions she recently has been feeling quite weak and having falls.  She does have home health working with her.  I encouraged her to continue to follow their direction and to use a walker or if and when possible.  We have offered to write her for walker with wheels but she declines at this time  Patient was diagnosed with dementia at her previous office but no significant treatment has ever been done.  She declines trying Aricept at this time.  But I told her she if she wants to think about it and let us know that we start we can start trial of it.  They were concerned about her sugar being up her A1c was normal and controlled so her episodes where she is more confused is not related to hypoglycemia or hyperglycemia.  A lot her confusion does happen later in the evening is a once again I think this relates back to dementia.  Past Medical History:  Diagnosis Date  . Arthritis   . Cancer ([place])    skin cancer on head.  [name] Dysrhythmia    AFib  . GERD (gastroesophageal reflux disease)   . Hypothyroidism    Relevant past medical, surgical, family and social history reviewed and updated as indicated. Interim medical history since our last visit reviewed. Allergies and medications reviewed and updated. DATA REVIEWED: CHART IN EPIC  Family History reviewed for pertinent findings.  Review of Systems  Constitutional: Negative.  Negative for fatigue.  HENT: Negative.   Eyes: Negative.   Respiratory: Negative.  Negative for shortness of breath.   Gastrointestinal: Negative.   Genitourinary: Negative.   Neurological: Positive  for weakness.  Psychiatric/Behavioral: Positive for confusion.    Allergies as of 06/16/2019      Reactions   Sulfa Antibiotics Diarrhea      Medication List       Accurate as of June 16, 2019 11:59 PM. If you have any questions, ask your nurse or doctor.        STOP taking these medications   predniSONE 10 MG (21) Tbpk tablet Commonly known as: STERAPRED UNI-PAK 21 TAB Stopped by: Terald Sleeper, PA-C     TAKE these medications   acetaminophen 650 MG CR tablet Commonly known as: TYLENOL Take 650 mg by mouth every 8 (eight) hours as needed for pain.   aspirin EC 81 MG tablet Take 81 mg by mouth daily.   Baclofen 5 MG Tabs Take 5 mg by mouth 3 (three) times daily as needed. Started by: Terald Sleeper, PA-C   BLINK TEARS OP Place 1 drop into both eyes 3 (three) times daily as needed (dry eyes).   cholecalciferol 1000 units tablet Commonly known as: VITAMIN D Take 1,000 Units by mouth daily.   diclofenac sodium 1 % Gel Commonly known as: VOLTAREN Apply 4 g topically 4 (four) times daily. FOR KNEE arthritis   ferrous sulfate 325 (65 FE) MG tablet Take 325 mg by mouth daily with breakfast.  levothyroxine 88 MCG tablet Commonly known as: SYNTHROID TAKE ONE TABLET (88 MCG TOTAL) BY MOUTH DAILY.   metoprolol succinate 50 MG 24 hr tablet Commonly known as: TOPROL-XL TAKE 1 TABLET BY MOUTH EVERY DAY   multivitamin with minerals tablet Take 1 tablet by mouth daily. Spectra-vite   omeprazole 40 MG capsule Commonly known as: PRILOSEC Take 1 capsule (40 mg total) by mouth daily.          Objective:    BP (!) 155/90   Pulse 91   Temp (!) 96.4 F (35.8 C) (Temporal)   Ht 5\' 3"  (1.6 m)   SpO2 92%   BMI 25.93 kg/m   Allergies  Allergen Reactions  . Sulfa Antibiotics Diarrhea    Wt Readings from Last 3 Encounters:  06/19/19 142 lb (64.4 kg)  05/16/19 146 lb 6.4 oz (66.4 kg)  03/16/19 145 lb (65.8 kg)    Physical Exam Constitutional:       General: She is not in acute distress.    Appearance: Normal appearance. She is well-developed.  HENT:     Head: Normocephalic and atraumatic.  Cardiovascular:     Rate and Rhythm: Normal rate.  Pulmonary:     Effort: Pulmonary effort is normal.  Musculoskeletal:     Cervical back: She exhibits decreased range of motion, tenderness, pain and spasm.       Back:  Skin:    General: Skin is warm and dry.     Findings: No rash.  Neurological:     Mental Status: She is alert and oriented to person, place, and time.     Deep Tendon Reflexes: Reflexes are normal and symmetric.     Results for orders placed or performed in visit on 06/16/19  Bayer DCA Hb A1c Waived  Result Value Ref Range   HB A1C (BAYER DCA - WAIVED) 5.9 <7.0 %      Assessment & Plan:   1. Hyperglycemia - Bayer DCA Hb A1c Waived  2. Enuresis, nocturnal only Continue to watch for infection  3. Confusion Continue to watch for dementia symptoms  4. Dementia with behavioral disturbance, unspecified dementia type ([place]) Diagnosed with mild dementia while she was at her previous doctor.  Patient declines starting Aricept.  Information given to caregiver about caring for patients with dementia.  5. Need for immunization against influenza - Flu Vaccine QUAD High Dose(Fluad)  6. Neck pain - Baclofen 5 MG TABS; Take 5 mg by mouth 3 (three) times daily as needed.  Dispense: 30 tablet; Refill: 0 Use on a very rare basis and watch for confusion  Continue all other maintenance medications as listed above.  Follow up plan: No follow-ups on file.  Educational handout given for caring for patients with dementia  Terald Sleeper PA-C [facility] [address]  [place], [place] [address] [phone]   06/19/2019, 5:07 PM

## 2019-06-19 NOTE — Progress Notes (Signed)
Subjective:    Patient ID: Elaine Kennedy, female    DOB: 02/28/1927, 85 y.o.   MRN: [id number]  Chief Complaint  Patient presents with  . Follow-up    emergency room visit for fall    HPI Pt presents to the office today for hospital follow up with daughter. Pt fell on 06/17/19 and hit her head. She went to the ED at [facility] and had a negative CT head.   Pt states she feels "fine" now. She currently has [place] comes twice a week. Her BP this morning was 90/56, then this afternoon it was 121/74.   Daughter lives with her. Pt has small amount of dementia. She is A & O.    Review of Systems  Neurological: Positive for tremors and weakness.  All other systems reviewed and are negative.      Objective:   Physical Exam Vitals signs reviewed.  Constitutional:      General: She is not in acute distress.    Appearance: She is well-developed.  HENT:     Head: Normocephalic and atraumatic.  Eyes:     Pupils: Pupils are equal, round, and reactive to light.  Neck:     Musculoskeletal: Normal range of motion and neck supple.     Thyroid: No thyromegaly.  Cardiovascular:     Rate and Rhythm: Normal rate and regular rhythm.     Heart sounds: Normal heart sounds. No murmur.  Pulmonary:     Effort: Pulmonary effort is normal. No respiratory distress.     Breath sounds: Normal breath sounds. No wheezing.  Abdominal:     General: Bowel sounds are normal. There is no distension.     Palpations: Abdomen is soft.     Tenderness: There is no abdominal tenderness.  Musculoskeletal: Normal range of motion.        General: No tenderness.  Skin:    General: Skin is warm and dry.     Findings: Ecchymosis present.     Comments: Hematoma on back of skull   Neurological:     Mental Status: She is alert and oriented to person, place, and time. Mental status is at baseline.     Cranial Nerves: No cranial nerve deficit.     Motor: Weakness present.     Coordination:  Coordination abnormal.     Deep Tendon Reflexes: Reflexes are normal and symmetric.  Psychiatric:        Behavior: Behavior normal.        Thought Content: Thought content normal.        Judgment: Judgment normal.       BP (!) 147/75   Pulse 87   Temp (!) 96.7 F (35.9 C) (Temporal)   Ht 5' 3" (1.6 m)   Wt 142 lb (64.4 kg)   SpO2 99%   BMI 25.15 kg/m      Assessment & Plan:  CHERYLN BALCOM comes in today with chief complaint of Follow-up (emergency room visit for fall)   Diagnosis and orders addressed:  1. Hospital discharge follow-u - CMP14+EGFR - CBC with Differential/Platelet  2. Injury of head, subsequent encounter - CMP14+EGFR - CBC with Differential/Platelet  3. Fall in home, subsequent encounter - CMP14+EGFR - CBC with Differential/Platelet   Labs pending Pt is stable at this time, if having any changes from baseline call office.  Fall preventions discussed Health Maintenance reviewed Diet and exercise encouraged  Follow up plan: Keep follow up with PCP  Evelina Dun, FNP

## 2019-06-20 ENCOUNTER — Telehealth: Payer: Self-pay | Admitting: Physician Assistant

## 2019-06-20 LAB — CMP14+EGFR
ALT: 17 IU/L (ref 0–32)
AST: 20 IU/L (ref 0–40)
Albumin/Globulin Ratio: 1.7 (ref 1.2–2.2)
Albumin: 4.1 g/dL (ref 3.5–4.6)
Alkaline Phosphatase: 95 IU/L (ref 39–117)
BUN/Creatinine Ratio: 16 (ref 12–28)
BUN: 16 mg/dL (ref 10–36)
Bilirubin Total: 0.3 mg/dL (ref 0.0–1.2)
CO2: 19 mmol/L — ABNORMAL LOW (ref 20–29)
Calcium: 8.9 mg/dL (ref 8.7–10.3)
Chloride: 106 mmol/L (ref 96–106)
Creatinine, Ser: 1 mg/dL (ref 0.57–1.00)
GFR calc Af Amer: 57 mL/min/{1.73_m2} — ABNORMAL LOW (ref >59)
GFR calc non Af Amer: 50 mL/min/{1.73_m2} — ABNORMAL LOW (ref >59)
Globulin, Total: 2.4 g/dL (ref 1.5–4.5)
Glucose: 105 mg/dL — ABNORMAL HIGH (ref 65–99)
Potassium: 4.2 mmol/L (ref 3.5–5.2)
Sodium: 140 mmol/L (ref 134–144)
Total Protein: 6.5 g/dL (ref 6.0–8.5)

## 2019-06-20 LAB — CBC WITH DIFFERENTIAL/PLATELET
Basophils Absolute: 0.1 10*3/uL (ref 0.0–0.2)
Basos: 1 %
EOS (ABSOLUTE): 0.5 10*3/uL — ABNORMAL HIGH (ref 0.0–0.4)
Eos: 7 %
Hematocrit: 35 % (ref 34.0–46.6)
Hemoglobin: 11.7 g/dL (ref 11.1–15.9)
Immature Grans (Abs): 0 10*3/uL (ref 0.0–0.1)
Immature Granulocytes: 0 %
Lymphocytes Absolute: 2.2 10*3/uL (ref 0.7–3.1)
Lymphs: 33 %
MCH: 28.5 pg (ref 26.6–33.0)
MCHC: 33.4 g/dL (ref 31.5–35.7)
MCV: 85 fL (ref 79–97)
Monocytes Absolute: 0.6 10*3/uL (ref 0.1–0.9)
Monocytes: 9 %
Neutrophils Absolute: 3.4 10*3/uL (ref 1.4–7.0)
Neutrophils: 50 %
Platelets: 259 10*3/uL (ref 150–450)
RBC: 4.1 x10E6/uL (ref 3.77–5.28)
RDW: 13.7 % (ref 11.7–15.4)
WBC: 6.8 10*3/uL (ref 3.4–10.8)

## 2019-06-20 NOTE — Telephone Encounter (Signed)
Patients BP was running low all day yesterday. Patient took Metoprolol 25 mg  at 8 am.  78/49at 6:45am 90/50 at 9:30am 129/87 at 7:40pm  Patients BP today is running  Patient tool Metoprolol 25mg  at 8am this morning 114/72 at 7:35  110/67 at 9:30am

## 2019-06-20 NOTE — Telephone Encounter (Signed)
Cut metoprolol 50 mg in half and continue to monitor the Lopressor Increase fluids, even to use something like Gatorade to increase her fluids. Because a lot of lower going to the emergency room.

## 2019-06-20 NOTE — Telephone Encounter (Signed)
Patients daughter aware and verbalized understanding.

## 2019-06-21 ENCOUNTER — Telehealth: Payer: Self-pay | Admitting: Physician Assistant

## 2019-06-21 DIAGNOSIS — Z96649 Presence of unspecified artificial hip joint: Secondary | ICD-10-CM | POA: Diagnosis not present

## 2019-06-21 DIAGNOSIS — I4891 Unspecified atrial fibrillation: Secondary | ICD-10-CM | POA: Diagnosis not present

## 2019-06-21 DIAGNOSIS — L97811 Non-pressure chronic ulcer of other part of right lower leg limited to breakdown of skin: Secondary | ICD-10-CM | POA: Diagnosis not present

## 2019-06-21 DIAGNOSIS — Z9181 History of falling: Secondary | ICD-10-CM | POA: Diagnosis not present

## 2019-06-21 DIAGNOSIS — E559 Vitamin D deficiency, unspecified: Secondary | ICD-10-CM | POA: Diagnosis not present

## 2019-06-21 DIAGNOSIS — E039 Hypothyroidism, unspecified: Secondary | ICD-10-CM | POA: Diagnosis not present

## 2019-06-21 DIAGNOSIS — L97221 Non-pressure chronic ulcer of left calf limited to breakdown of skin: Secondary | ICD-10-CM | POA: Diagnosis not present

## 2019-06-21 DIAGNOSIS — I872 Venous insufficiency (chronic) (peripheral): Secondary | ICD-10-CM | POA: Diagnosis not present

## 2019-06-21 DIAGNOSIS — K219 Gastro-esophageal reflux disease without esophagitis: Secondary | ICD-10-CM | POA: Diagnosis not present

## 2019-06-21 DIAGNOSIS — I499 Cardiac arrhythmia, unspecified: Secondary | ICD-10-CM | POA: Diagnosis not present

## 2019-06-21 DIAGNOSIS — Z85828 Personal history of other malignant neoplasm of skin: Secondary | ICD-10-CM | POA: Diagnosis not present

## 2019-06-21 DIAGNOSIS — Z7982 Long term (current) use of aspirin: Secondary | ICD-10-CM | POA: Diagnosis not present

## 2019-06-21 DIAGNOSIS — I10 Essential (primary) hypertension: Secondary | ICD-10-CM | POA: Diagnosis not present

## 2019-06-21 DIAGNOSIS — M170 Bilateral primary osteoarthritis of knee: Secondary | ICD-10-CM | POA: Diagnosis not present

## 2019-06-21 DIAGNOSIS — D509 Iron deficiency anemia, unspecified: Secondary | ICD-10-CM | POA: Diagnosis not present

## 2019-06-21 DIAGNOSIS — L03115 Cellulitis of right lower limb: Secondary | ICD-10-CM | POA: Diagnosis not present

## 2019-06-22 DIAGNOSIS — Z7982 Long term (current) use of aspirin: Secondary | ICD-10-CM | POA: Diagnosis not present

## 2019-06-22 DIAGNOSIS — E559 Vitamin D deficiency, unspecified: Secondary | ICD-10-CM | POA: Diagnosis not present

## 2019-06-22 DIAGNOSIS — I499 Cardiac arrhythmia, unspecified: Secondary | ICD-10-CM | POA: Diagnosis not present

## 2019-06-22 DIAGNOSIS — K219 Gastro-esophageal reflux disease without esophagitis: Secondary | ICD-10-CM | POA: Diagnosis not present

## 2019-06-22 DIAGNOSIS — I10 Essential (primary) hypertension: Secondary | ICD-10-CM | POA: Diagnosis not present

## 2019-06-22 DIAGNOSIS — I872 Venous insufficiency (chronic) (peripheral): Secondary | ICD-10-CM | POA: Diagnosis not present

## 2019-06-22 DIAGNOSIS — Z85828 Personal history of other malignant neoplasm of skin: Secondary | ICD-10-CM | POA: Diagnosis not present

## 2019-06-22 DIAGNOSIS — E039 Hypothyroidism, unspecified: Secondary | ICD-10-CM | POA: Diagnosis not present

## 2019-06-22 DIAGNOSIS — I4891 Unspecified atrial fibrillation: Secondary | ICD-10-CM | POA: Diagnosis not present

## 2019-06-22 DIAGNOSIS — Z96649 Presence of unspecified artificial hip joint: Secondary | ICD-10-CM | POA: Diagnosis not present

## 2019-06-22 DIAGNOSIS — Z9181 History of falling: Secondary | ICD-10-CM | POA: Diagnosis not present

## 2019-06-22 DIAGNOSIS — L03115 Cellulitis of right lower limb: Secondary | ICD-10-CM | POA: Diagnosis not present

## 2019-06-22 DIAGNOSIS — L97221 Non-pressure chronic ulcer of left calf limited to breakdown of skin: Secondary | ICD-10-CM | POA: Diagnosis not present

## 2019-06-22 DIAGNOSIS — D509 Iron deficiency anemia, unspecified: Secondary | ICD-10-CM | POA: Diagnosis not present

## 2019-06-22 DIAGNOSIS — M170 Bilateral primary osteoarthritis of knee: Secondary | ICD-10-CM | POA: Diagnosis not present

## 2019-06-22 DIAGNOSIS — L97811 Non-pressure chronic ulcer of other part of right lower leg limited to breakdown of skin: Secondary | ICD-10-CM | POA: Diagnosis not present

## 2019-06-25 DIAGNOSIS — R410 Disorientation, unspecified: Secondary | ICD-10-CM | POA: Diagnosis not present

## 2019-06-25 DIAGNOSIS — R58 Hemorrhage, not elsewhere classified: Secondary | ICD-10-CM | POA: Diagnosis not present

## 2019-06-26 DIAGNOSIS — E039 Hypothyroidism, unspecified: Secondary | ICD-10-CM | POA: Diagnosis not present

## 2019-06-26 DIAGNOSIS — L97811 Non-pressure chronic ulcer of other part of right lower leg limited to breakdown of skin: Secondary | ICD-10-CM | POA: Diagnosis not present

## 2019-06-26 DIAGNOSIS — L03115 Cellulitis of right lower limb: Secondary | ICD-10-CM | POA: Diagnosis not present

## 2019-06-26 DIAGNOSIS — Z85828 Personal history of other malignant neoplasm of skin: Secondary | ICD-10-CM | POA: Diagnosis not present

## 2019-06-26 DIAGNOSIS — Z7982 Long term (current) use of aspirin: Secondary | ICD-10-CM | POA: Diagnosis not present

## 2019-06-26 DIAGNOSIS — I499 Cardiac arrhythmia, unspecified: Secondary | ICD-10-CM | POA: Diagnosis not present

## 2019-06-26 DIAGNOSIS — K219 Gastro-esophageal reflux disease without esophagitis: Secondary | ICD-10-CM | POA: Diagnosis not present

## 2019-06-26 DIAGNOSIS — D509 Iron deficiency anemia, unspecified: Secondary | ICD-10-CM | POA: Diagnosis not present

## 2019-06-26 DIAGNOSIS — Z9181 History of falling: Secondary | ICD-10-CM | POA: Diagnosis not present

## 2019-06-26 DIAGNOSIS — E559 Vitamin D deficiency, unspecified: Secondary | ICD-10-CM | POA: Diagnosis not present

## 2019-06-26 DIAGNOSIS — I872 Venous insufficiency (chronic) (peripheral): Secondary | ICD-10-CM | POA: Diagnosis not present

## 2019-06-26 DIAGNOSIS — M170 Bilateral primary osteoarthritis of knee: Secondary | ICD-10-CM | POA: Diagnosis not present

## 2019-06-26 DIAGNOSIS — Z96649 Presence of unspecified artificial hip joint: Secondary | ICD-10-CM | POA: Diagnosis not present

## 2019-06-26 DIAGNOSIS — I10 Essential (primary) hypertension: Secondary | ICD-10-CM | POA: Diagnosis not present

## 2019-06-26 DIAGNOSIS — L97221 Non-pressure chronic ulcer of left calf limited to breakdown of skin: Secondary | ICD-10-CM | POA: Diagnosis not present

## 2019-06-26 DIAGNOSIS — I4891 Unspecified atrial fibrillation: Secondary | ICD-10-CM | POA: Diagnosis not present

## 2019-06-29 DIAGNOSIS — M1610 Unilateral primary osteoarthritis, unspecified hip: Secondary | ICD-10-CM | POA: Diagnosis not present

## 2019-06-29 DIAGNOSIS — I10 Essential (primary) hypertension: Secondary | ICD-10-CM | POA: Diagnosis not present

## 2019-06-29 DIAGNOSIS — Z85828 Personal history of other malignant neoplasm of skin: Secondary | ICD-10-CM | POA: Diagnosis not present

## 2019-06-29 DIAGNOSIS — D509 Iron deficiency anemia, unspecified: Secondary | ICD-10-CM | POA: Diagnosis not present

## 2019-06-29 DIAGNOSIS — Z7982 Long term (current) use of aspirin: Secondary | ICD-10-CM | POA: Diagnosis not present

## 2019-06-29 DIAGNOSIS — I872 Venous insufficiency (chronic) (peripheral): Secondary | ICD-10-CM | POA: Diagnosis not present

## 2019-06-29 DIAGNOSIS — S80922D Unspecified superficial injury of left lower leg, subsequent encounter: Secondary | ICD-10-CM | POA: Diagnosis not present

## 2019-06-29 DIAGNOSIS — I4891 Unspecified atrial fibrillation: Secondary | ICD-10-CM | POA: Diagnosis not present

## 2019-06-29 DIAGNOSIS — L97221 Non-pressure chronic ulcer of left calf limited to breakdown of skin: Secondary | ICD-10-CM | POA: Diagnosis not present

## 2019-06-29 DIAGNOSIS — M170 Bilateral primary osteoarthritis of knee: Secondary | ICD-10-CM | POA: Diagnosis not present

## 2019-06-29 DIAGNOSIS — Z9181 History of falling: Secondary | ICD-10-CM | POA: Diagnosis not present

## 2019-06-29 DIAGNOSIS — Z96649 Presence of unspecified artificial hip joint: Secondary | ICD-10-CM | POA: Diagnosis not present

## 2019-06-29 DIAGNOSIS — E039 Hypothyroidism, unspecified: Secondary | ICD-10-CM | POA: Diagnosis not present

## 2019-06-29 DIAGNOSIS — K219 Gastro-esophageal reflux disease without esophagitis: Secondary | ICD-10-CM | POA: Diagnosis not present

## 2019-06-29 DIAGNOSIS — I499 Cardiac arrhythmia, unspecified: Secondary | ICD-10-CM | POA: Diagnosis not present

## 2019-06-29 DIAGNOSIS — E559 Vitamin D deficiency, unspecified: Secondary | ICD-10-CM | POA: Diagnosis not present

## 2019-06-30 DIAGNOSIS — E039 Hypothyroidism, unspecified: Secondary | ICD-10-CM | POA: Diagnosis not present

## 2019-06-30 DIAGNOSIS — Z9181 History of falling: Secondary | ICD-10-CM | POA: Diagnosis not present

## 2019-06-30 DIAGNOSIS — I872 Venous insufficiency (chronic) (peripheral): Secondary | ICD-10-CM | POA: Diagnosis not present

## 2019-06-30 DIAGNOSIS — I10 Essential (primary) hypertension: Secondary | ICD-10-CM | POA: Diagnosis not present

## 2019-06-30 DIAGNOSIS — I499 Cardiac arrhythmia, unspecified: Secondary | ICD-10-CM | POA: Diagnosis not present

## 2019-06-30 DIAGNOSIS — L97221 Non-pressure chronic ulcer of left calf limited to breakdown of skin: Secondary | ICD-10-CM | POA: Diagnosis not present

## 2019-06-30 DIAGNOSIS — K219 Gastro-esophageal reflux disease without esophagitis: Secondary | ICD-10-CM | POA: Diagnosis not present

## 2019-06-30 DIAGNOSIS — Z7982 Long term (current) use of aspirin: Secondary | ICD-10-CM | POA: Diagnosis not present

## 2019-06-30 DIAGNOSIS — D509 Iron deficiency anemia, unspecified: Secondary | ICD-10-CM | POA: Diagnosis not present

## 2019-06-30 DIAGNOSIS — Z85828 Personal history of other malignant neoplasm of skin: Secondary | ICD-10-CM | POA: Diagnosis not present

## 2019-06-30 DIAGNOSIS — S80922D Unspecified superficial injury of left lower leg, subsequent encounter: Secondary | ICD-10-CM | POA: Diagnosis not present

## 2019-06-30 DIAGNOSIS — I4891 Unspecified atrial fibrillation: Secondary | ICD-10-CM | POA: Diagnosis not present

## 2019-06-30 DIAGNOSIS — M1610 Unilateral primary osteoarthritis, unspecified hip: Secondary | ICD-10-CM | POA: Diagnosis not present

## 2019-06-30 DIAGNOSIS — E559 Vitamin D deficiency, unspecified: Secondary | ICD-10-CM | POA: Diagnosis not present

## 2019-06-30 DIAGNOSIS — Z96649 Presence of unspecified artificial hip joint: Secondary | ICD-10-CM | POA: Diagnosis not present

## 2019-06-30 DIAGNOSIS — M170 Bilateral primary osteoarthritis of knee: Secondary | ICD-10-CM | POA: Diagnosis not present

## 2019-07-03 DIAGNOSIS — M1610 Unilateral primary osteoarthritis, unspecified hip: Secondary | ICD-10-CM | POA: Diagnosis not present

## 2019-07-03 DIAGNOSIS — I10 Essential (primary) hypertension: Secondary | ICD-10-CM | POA: Diagnosis not present

## 2019-07-03 DIAGNOSIS — E559 Vitamin D deficiency, unspecified: Secondary | ICD-10-CM | POA: Diagnosis not present

## 2019-07-03 DIAGNOSIS — K219 Gastro-esophageal reflux disease without esophagitis: Secondary | ICD-10-CM | POA: Diagnosis not present

## 2019-07-03 DIAGNOSIS — M170 Bilateral primary osteoarthritis of knee: Secondary | ICD-10-CM | POA: Diagnosis not present

## 2019-07-03 DIAGNOSIS — S80922D Unspecified superficial injury of left lower leg, subsequent encounter: Secondary | ICD-10-CM | POA: Diagnosis not present

## 2019-07-03 DIAGNOSIS — L97221 Non-pressure chronic ulcer of left calf limited to breakdown of skin: Secondary | ICD-10-CM | POA: Diagnosis not present

## 2019-07-03 DIAGNOSIS — I499 Cardiac arrhythmia, unspecified: Secondary | ICD-10-CM | POA: Diagnosis not present

## 2019-07-03 DIAGNOSIS — I872 Venous insufficiency (chronic) (peripheral): Secondary | ICD-10-CM | POA: Diagnosis not present

## 2019-07-03 DIAGNOSIS — I4891 Unspecified atrial fibrillation: Secondary | ICD-10-CM | POA: Diagnosis not present

## 2019-07-03 DIAGNOSIS — D509 Iron deficiency anemia, unspecified: Secondary | ICD-10-CM | POA: Diagnosis not present

## 2019-07-03 DIAGNOSIS — Z85828 Personal history of other malignant neoplasm of skin: Secondary | ICD-10-CM | POA: Diagnosis not present

## 2019-07-03 DIAGNOSIS — E039 Hypothyroidism, unspecified: Secondary | ICD-10-CM | POA: Diagnosis not present

## 2019-07-03 DIAGNOSIS — Z9181 History of falling: Secondary | ICD-10-CM | POA: Diagnosis not present

## 2019-07-03 DIAGNOSIS — Z7982 Long term (current) use of aspirin: Secondary | ICD-10-CM | POA: Diagnosis not present

## 2019-07-03 DIAGNOSIS — Z96649 Presence of unspecified artificial hip joint: Secondary | ICD-10-CM | POA: Diagnosis not present

## 2019-07-04 DIAGNOSIS — L84 Corns and callosities: Secondary | ICD-10-CM | POA: Diagnosis not present

## 2019-07-04 DIAGNOSIS — M79676 Pain in unspecified toe(s): Secondary | ICD-10-CM | POA: Diagnosis not present

## 2019-07-04 DIAGNOSIS — B351 Tinea unguium: Secondary | ICD-10-CM | POA: Diagnosis not present

## 2019-07-04 DIAGNOSIS — I70203 Unspecified atherosclerosis of native arteries of extremities, bilateral legs: Secondary | ICD-10-CM | POA: Diagnosis not present

## 2019-07-06 DIAGNOSIS — Z96649 Presence of unspecified artificial hip joint: Secondary | ICD-10-CM | POA: Diagnosis not present

## 2019-07-06 DIAGNOSIS — I4891 Unspecified atrial fibrillation: Secondary | ICD-10-CM | POA: Diagnosis not present

## 2019-07-06 DIAGNOSIS — Z7982 Long term (current) use of aspirin: Secondary | ICD-10-CM | POA: Diagnosis not present

## 2019-07-06 DIAGNOSIS — K219 Gastro-esophageal reflux disease without esophagitis: Secondary | ICD-10-CM | POA: Diagnosis not present

## 2019-07-06 DIAGNOSIS — I10 Essential (primary) hypertension: Secondary | ICD-10-CM | POA: Diagnosis not present

## 2019-07-06 DIAGNOSIS — E039 Hypothyroidism, unspecified: Secondary | ICD-10-CM | POA: Diagnosis not present

## 2019-07-06 DIAGNOSIS — I872 Venous insufficiency (chronic) (peripheral): Secondary | ICD-10-CM | POA: Diagnosis not present

## 2019-07-06 DIAGNOSIS — D509 Iron deficiency anemia, unspecified: Secondary | ICD-10-CM | POA: Diagnosis not present

## 2019-07-06 DIAGNOSIS — Z85828 Personal history of other malignant neoplasm of skin: Secondary | ICD-10-CM | POA: Diagnosis not present

## 2019-07-06 DIAGNOSIS — S80922D Unspecified superficial injury of left lower leg, subsequent encounter: Secondary | ICD-10-CM | POA: Diagnosis not present

## 2019-07-06 DIAGNOSIS — M170 Bilateral primary osteoarthritis of knee: Secondary | ICD-10-CM | POA: Diagnosis not present

## 2019-07-06 DIAGNOSIS — L97221 Non-pressure chronic ulcer of left calf limited to breakdown of skin: Secondary | ICD-10-CM | POA: Diagnosis not present

## 2019-07-06 DIAGNOSIS — I499 Cardiac arrhythmia, unspecified: Secondary | ICD-10-CM | POA: Diagnosis not present

## 2019-07-06 DIAGNOSIS — M1610 Unilateral primary osteoarthritis, unspecified hip: Secondary | ICD-10-CM | POA: Diagnosis not present

## 2019-07-06 DIAGNOSIS — E559 Vitamin D deficiency, unspecified: Secondary | ICD-10-CM | POA: Diagnosis not present

## 2019-07-06 DIAGNOSIS — Z9181 History of falling: Secondary | ICD-10-CM | POA: Diagnosis not present

## 2019-07-07 ENCOUNTER — Ambulatory Visit (INDEPENDENT_AMBULATORY_CARE_PROVIDER_SITE_OTHER): Payer: Medicare Other | Admitting: Physician Assistant

## 2019-07-07 DIAGNOSIS — I872 Venous insufficiency (chronic) (peripheral): Secondary | ICD-10-CM | POA: Diagnosis not present

## 2019-07-07 DIAGNOSIS — E559 Vitamin D deficiency, unspecified: Secondary | ICD-10-CM | POA: Diagnosis not present

## 2019-07-07 DIAGNOSIS — I499 Cardiac arrhythmia, unspecified: Secondary | ICD-10-CM | POA: Diagnosis not present

## 2019-07-07 DIAGNOSIS — Z9181 History of falling: Secondary | ICD-10-CM | POA: Diagnosis not present

## 2019-07-07 DIAGNOSIS — I4891 Unspecified atrial fibrillation: Secondary | ICD-10-CM | POA: Diagnosis not present

## 2019-07-07 DIAGNOSIS — M6281 Muscle weakness (generalized): Secondary | ICD-10-CM

## 2019-07-07 DIAGNOSIS — I10 Essential (primary) hypertension: Secondary | ICD-10-CM | POA: Diagnosis not present

## 2019-07-07 DIAGNOSIS — E039 Hypothyroidism, unspecified: Secondary | ICD-10-CM | POA: Diagnosis not present

## 2019-07-07 DIAGNOSIS — D509 Iron deficiency anemia, unspecified: Secondary | ICD-10-CM | POA: Diagnosis not present

## 2019-07-07 DIAGNOSIS — M170 Bilateral primary osteoarthritis of knee: Secondary | ICD-10-CM | POA: Diagnosis not present

## 2019-07-07 DIAGNOSIS — Z96649 Presence of unspecified artificial hip joint: Secondary | ICD-10-CM | POA: Diagnosis not present

## 2019-07-07 DIAGNOSIS — K219 Gastro-esophageal reflux disease without esophagitis: Secondary | ICD-10-CM | POA: Diagnosis not present

## 2019-07-07 DIAGNOSIS — M1610 Unilateral primary osteoarthritis, unspecified hip: Secondary | ICD-10-CM | POA: Diagnosis not present

## 2019-07-07 DIAGNOSIS — R630 Anorexia: Secondary | ICD-10-CM

## 2019-07-07 DIAGNOSIS — Z7982 Long term (current) use of aspirin: Secondary | ICD-10-CM | POA: Diagnosis not present

## 2019-07-07 DIAGNOSIS — S80922D Unspecified superficial injury of left lower leg, subsequent encounter: Secondary | ICD-10-CM | POA: Diagnosis not present

## 2019-07-07 DIAGNOSIS — Z85828 Personal history of other malignant neoplasm of skin: Secondary | ICD-10-CM | POA: Diagnosis not present

## 2019-07-07 DIAGNOSIS — L97221 Non-pressure chronic ulcer of left calf limited to breakdown of skin: Secondary | ICD-10-CM | POA: Diagnosis not present

## 2019-07-07 MED ORDER — MEGESTROL ACETATE 20 MG PO TABS: 20.0000 mg | ORAL_TABLET | Freq: Every day | ORAL | 2 refills | Status: DC

## 2019-07-10 ENCOUNTER — Encounter: Payer: Self-pay | Admitting: Physician Assistant

## 2019-07-10 DIAGNOSIS — I872 Venous insufficiency (chronic) (peripheral): Secondary | ICD-10-CM | POA: Diagnosis not present

## 2019-07-10 DIAGNOSIS — Z7982 Long term (current) use of aspirin: Secondary | ICD-10-CM | POA: Diagnosis not present

## 2019-07-10 DIAGNOSIS — I499 Cardiac arrhythmia, unspecified: Secondary | ICD-10-CM | POA: Diagnosis not present

## 2019-07-10 DIAGNOSIS — S80922D Unspecified superficial injury of left lower leg, subsequent encounter: Secondary | ICD-10-CM | POA: Diagnosis not present

## 2019-07-10 DIAGNOSIS — Z85828 Personal history of other malignant neoplasm of skin: Secondary | ICD-10-CM | POA: Diagnosis not present

## 2019-07-10 DIAGNOSIS — I4891 Unspecified atrial fibrillation: Secondary | ICD-10-CM | POA: Diagnosis not present

## 2019-07-10 DIAGNOSIS — I10 Essential (primary) hypertension: Secondary | ICD-10-CM | POA: Diagnosis not present

## 2019-07-10 DIAGNOSIS — E039 Hypothyroidism, unspecified: Secondary | ICD-10-CM | POA: Diagnosis not present

## 2019-07-10 DIAGNOSIS — Z96649 Presence of unspecified artificial hip joint: Secondary | ICD-10-CM | POA: Diagnosis not present

## 2019-07-10 DIAGNOSIS — M170 Bilateral primary osteoarthritis of knee: Secondary | ICD-10-CM | POA: Diagnosis not present

## 2019-07-10 DIAGNOSIS — K219 Gastro-esophageal reflux disease without esophagitis: Secondary | ICD-10-CM | POA: Diagnosis not present

## 2019-07-10 DIAGNOSIS — D509 Iron deficiency anemia, unspecified: Secondary | ICD-10-CM | POA: Diagnosis not present

## 2019-07-10 DIAGNOSIS — M1610 Unilateral primary osteoarthritis, unspecified hip: Secondary | ICD-10-CM | POA: Diagnosis not present

## 2019-07-10 DIAGNOSIS — L97221 Non-pressure chronic ulcer of left calf limited to breakdown of skin: Secondary | ICD-10-CM | POA: Diagnosis not present

## 2019-07-10 DIAGNOSIS — Z9181 History of falling: Secondary | ICD-10-CM | POA: Diagnosis not present

## 2019-07-10 DIAGNOSIS — E559 Vitamin D deficiency, unspecified: Secondary | ICD-10-CM | POA: Diagnosis not present

## 2019-07-10 NOTE — Progress Notes (Signed)
Telephone visit  Subjective: [id number] of appetite PCP: Terald Sleeper, PA-C [id number] Elaine Kennedy is a 85 y.o. female calls for telephone consult today. Patient provides verbal consent for consult held via phone.  Patient is identified with 2 separate identifiers.  At this time the entire area is on COVID-19 social distancing and stay home orders are in place.  Patient is of higher risk and therefore we are performing this by a virtual method.  Location of patient: home Location of provider: HOME Others present for call: daughter  This patient is having a phone visit related to her decreased appetite.  There is nothing else specifically going on.  Her daughter states that there is no change or odor in her urine.  She has had a urinary tract infection in the past.  Her legs are fairly well-healed at this time, she does recurrently get cellulitis in her lower legs.  She is choosing not to do much activity and is somewhat argumentative with them when they try to get her up for walking and her physical therapy exercises.  She is still under home health care.  We have discussed the possibility of adding something for appetite stimulation.  The family is willing to try this.  We will send medication and recheck in a month.   ROS: Per HPI  Allergies  Allergen Reactions  . Sulfa Antibiotics Diarrhea   Past Medical History:  Diagnosis Date  . Arthritis   . Cancer ([place])    skin cancer on head.  [name] Dysrhythmia    AFib  . GERD (gastroesophageal reflux disease)   . Hypothyroidism     Current Outpatient Medications:  .  acetaminophen (TYLENOL) 650 MG CR tablet, Take 650 mg by mouth every 8 (eight) hours as needed for pain. , Disp: , Rfl:  .  aspirin EC 81 MG tablet, Take 81 mg by mouth daily., Disp: , Rfl:  .  Baclofen 5 MG TABS, Take 5 mg by mouth 3 (three) times daily as needed., Disp: 30 tablet, Rfl: 0 .  cholecalciferol (VITAMIN D) 1000 units tablet, Take 1,000 Units by mouth daily.,  Disp: , Rfl:  .  diclofenac sodium (VOLTAREN) 1 % GEL, Apply 4 g topically 4 (four) times daily. FOR KNEE arthritis, Disp: 200 g, Rfl: 11 .  ferrous sulfate 325 (65 FE) MG tablet, Take 325 mg by mouth daily with breakfast., Disp: , Rfl:  .  levothyroxine (SYNTHROID) 88 MCG tablet, TAKE ONE TABLET (88 MCG TOTAL) BY MOUTH DAILY., Disp: 90 tablet, Rfl: 1 .  megestrol (MEGACE) 20 MG tablet, Take 1 tablet (20 mg total) by mouth daily. For appetite stimulation, Disp: 30 tablet, Rfl: 2 .  metoprolol succinate (TOPROL-XL) 50 MG 24 hr tablet, TAKE 1 TABLET BY MOUTH EVERY DAY, Disp: 90 tablet, Rfl: 0 .  Multiple Vitamins-Minerals (MULTIVITAMIN WITH MINERALS) tablet, Take 1 tablet by mouth daily. Spectra-vite, Disp: , Rfl:  .  omeprazole (PRILOSEC) 40 MG capsule, Take 1 capsule (40 mg total) by mouth daily., Disp: 90 capsule, Rfl: 1 .  Polyethylene Glycol 400 (BLINK TEARS OP), Place 1 drop into both eyes 3 (three) times daily as needed (dry eyes)., Disp: , Rfl:   Assessment/ Plan: 85 y.o. female   1. Decreased appetite - megestrol (MEGACE) 20 MG tablet; Take 1 tablet (20 mg total) by mouth daily. For appetite stimulation  Dispense: 30 tablet; Refill: 2  2. Muscle weakness (generalized) Continue therapy   No follow-ups on file.  Continue all  other maintenance medications as listed above.  Start time: 8:17 AM End time: 8:28 AM  Meds ordered this encounter  Medications  . megestrol (MEGACE) 20 MG tablet    Sig: Take 1 tablet (20 mg total) by mouth daily. For appetite stimulation    Dispense:  30 tablet    Refill:  2    Order Specific Question:   Supervising Provider    Answer:   [name] [id number]    Particia Nearing PA-C [place] [phone]

## 2019-07-14 DIAGNOSIS — M1610 Unilateral primary osteoarthritis, unspecified hip: Secondary | ICD-10-CM | POA: Diagnosis not present

## 2019-07-14 DIAGNOSIS — K219 Gastro-esophageal reflux disease without esophagitis: Secondary | ICD-10-CM | POA: Diagnosis not present

## 2019-07-14 DIAGNOSIS — Z96649 Presence of unspecified artificial hip joint: Secondary | ICD-10-CM | POA: Diagnosis not present

## 2019-07-14 DIAGNOSIS — I4891 Unspecified atrial fibrillation: Secondary | ICD-10-CM | POA: Diagnosis not present

## 2019-07-14 DIAGNOSIS — L97221 Non-pressure chronic ulcer of left calf limited to breakdown of skin: Secondary | ICD-10-CM | POA: Diagnosis not present

## 2019-07-14 DIAGNOSIS — I499 Cardiac arrhythmia, unspecified: Secondary | ICD-10-CM | POA: Diagnosis not present

## 2019-07-14 DIAGNOSIS — D509 Iron deficiency anemia, unspecified: Secondary | ICD-10-CM | POA: Diagnosis not present

## 2019-07-14 DIAGNOSIS — Z7982 Long term (current) use of aspirin: Secondary | ICD-10-CM | POA: Diagnosis not present

## 2019-07-14 DIAGNOSIS — I10 Essential (primary) hypertension: Secondary | ICD-10-CM | POA: Diagnosis not present

## 2019-07-14 DIAGNOSIS — E039 Hypothyroidism, unspecified: Secondary | ICD-10-CM | POA: Diagnosis not present

## 2019-07-14 DIAGNOSIS — S80922D Unspecified superficial injury of left lower leg, subsequent encounter: Secondary | ICD-10-CM | POA: Diagnosis not present

## 2019-07-14 DIAGNOSIS — M170 Bilateral primary osteoarthritis of knee: Secondary | ICD-10-CM | POA: Diagnosis not present

## 2019-07-14 DIAGNOSIS — E559 Vitamin D deficiency, unspecified: Secondary | ICD-10-CM | POA: Diagnosis not present

## 2019-07-14 DIAGNOSIS — Z85828 Personal history of other malignant neoplasm of skin: Secondary | ICD-10-CM | POA: Diagnosis not present

## 2019-07-14 DIAGNOSIS — I872 Venous insufficiency (chronic) (peripheral): Secondary | ICD-10-CM | POA: Diagnosis not present

## 2019-07-14 DIAGNOSIS — Z9181 History of falling: Secondary | ICD-10-CM | POA: Diagnosis not present

## 2019-07-15 DIAGNOSIS — R5381 Other malaise: Secondary | ICD-10-CM | POA: Diagnosis not present

## 2019-07-15 DIAGNOSIS — W19XXXA Unspecified fall, initial encounter: Secondary | ICD-10-CM | POA: Diagnosis not present

## 2019-07-15 DIAGNOSIS — T1490XA Injury, unspecified, initial encounter: Secondary | ICD-10-CM | POA: Diagnosis not present

## 2019-07-17 DIAGNOSIS — I10 Essential (primary) hypertension: Secondary | ICD-10-CM | POA: Diagnosis not present

## 2019-07-17 DIAGNOSIS — Z85828 Personal history of other malignant neoplasm of skin: Secondary | ICD-10-CM | POA: Diagnosis not present

## 2019-07-17 DIAGNOSIS — M170 Bilateral primary osteoarthritis of knee: Secondary | ICD-10-CM | POA: Diagnosis not present

## 2019-07-17 DIAGNOSIS — I872 Venous insufficiency (chronic) (peripheral): Secondary | ICD-10-CM | POA: Diagnosis not present

## 2019-07-17 DIAGNOSIS — Z96649 Presence of unspecified artificial hip joint: Secondary | ICD-10-CM | POA: Diagnosis not present

## 2019-07-17 DIAGNOSIS — L97221 Non-pressure chronic ulcer of left calf limited to breakdown of skin: Secondary | ICD-10-CM | POA: Diagnosis not present

## 2019-07-17 DIAGNOSIS — Z7982 Long term (current) use of aspirin: Secondary | ICD-10-CM | POA: Diagnosis not present

## 2019-07-17 DIAGNOSIS — D509 Iron deficiency anemia, unspecified: Secondary | ICD-10-CM | POA: Diagnosis not present

## 2019-07-17 DIAGNOSIS — I4891 Unspecified atrial fibrillation: Secondary | ICD-10-CM | POA: Diagnosis not present

## 2019-07-17 DIAGNOSIS — E559 Vitamin D deficiency, unspecified: Secondary | ICD-10-CM | POA: Diagnosis not present

## 2019-07-17 DIAGNOSIS — Z9181 History of falling: Secondary | ICD-10-CM | POA: Diagnosis not present

## 2019-07-17 DIAGNOSIS — I499 Cardiac arrhythmia, unspecified: Secondary | ICD-10-CM | POA: Diagnosis not present

## 2019-07-17 DIAGNOSIS — M1610 Unilateral primary osteoarthritis, unspecified hip: Secondary | ICD-10-CM | POA: Diagnosis not present

## 2019-07-17 DIAGNOSIS — E039 Hypothyroidism, unspecified: Secondary | ICD-10-CM | POA: Diagnosis not present

## 2019-07-17 DIAGNOSIS — S80922D Unspecified superficial injury of left lower leg, subsequent encounter: Secondary | ICD-10-CM | POA: Diagnosis not present

## 2019-07-17 DIAGNOSIS — K219 Gastro-esophageal reflux disease without esophagitis: Secondary | ICD-10-CM | POA: Diagnosis not present

## 2019-07-18 DIAGNOSIS — I10 Essential (primary) hypertension: Secondary | ICD-10-CM | POA: Diagnosis not present

## 2019-07-18 DIAGNOSIS — S80922D Unspecified superficial injury of left lower leg, subsequent encounter: Secondary | ICD-10-CM | POA: Diagnosis not present

## 2019-07-18 DIAGNOSIS — I4891 Unspecified atrial fibrillation: Secondary | ICD-10-CM | POA: Diagnosis not present

## 2019-07-18 DIAGNOSIS — M170 Bilateral primary osteoarthritis of knee: Secondary | ICD-10-CM | POA: Diagnosis not present

## 2019-07-18 DIAGNOSIS — Z9181 History of falling: Secondary | ICD-10-CM | POA: Diagnosis not present

## 2019-07-18 DIAGNOSIS — I499 Cardiac arrhythmia, unspecified: Secondary | ICD-10-CM | POA: Diagnosis not present

## 2019-07-18 DIAGNOSIS — K219 Gastro-esophageal reflux disease without esophagitis: Secondary | ICD-10-CM | POA: Diagnosis not present

## 2019-07-18 DIAGNOSIS — L97221 Non-pressure chronic ulcer of left calf limited to breakdown of skin: Secondary | ICD-10-CM | POA: Diagnosis not present

## 2019-07-18 DIAGNOSIS — E039 Hypothyroidism, unspecified: Secondary | ICD-10-CM | POA: Diagnosis not present

## 2019-07-18 DIAGNOSIS — M1610 Unilateral primary osteoarthritis, unspecified hip: Secondary | ICD-10-CM | POA: Diagnosis not present

## 2019-07-18 DIAGNOSIS — M6281 Muscle weakness (generalized): Secondary | ICD-10-CM | POA: Diagnosis not present

## 2019-07-18 DIAGNOSIS — Z7982 Long term (current) use of aspirin: Secondary | ICD-10-CM | POA: Diagnosis not present

## 2019-07-18 DIAGNOSIS — Z85828 Personal history of other malignant neoplasm of skin: Secondary | ICD-10-CM | POA: Diagnosis not present

## 2019-07-18 DIAGNOSIS — D509 Iron deficiency anemia, unspecified: Secondary | ICD-10-CM | POA: Diagnosis not present

## 2019-07-18 DIAGNOSIS — I872 Venous insufficiency (chronic) (peripheral): Secondary | ICD-10-CM | POA: Diagnosis not present

## 2019-07-18 DIAGNOSIS — E559 Vitamin D deficiency, unspecified: Secondary | ICD-10-CM | POA: Diagnosis not present

## 2019-07-18 DIAGNOSIS — Z96649 Presence of unspecified artificial hip joint: Secondary | ICD-10-CM | POA: Diagnosis not present

## 2019-07-20 ENCOUNTER — Encounter: Payer: Self-pay | Admitting: Nurse Practitioner

## 2019-07-20 ENCOUNTER — Other Ambulatory Visit: Payer: Self-pay

## 2019-07-20 ENCOUNTER — Ambulatory Visit (INDEPENDENT_AMBULATORY_CARE_PROVIDER_SITE_OTHER): Payer: Medicare Other | Admitting: Nurse Practitioner

## 2019-07-20 ENCOUNTER — Ambulatory Visit: Payer: Medicare Other | Admitting: Nurse Practitioner

## 2019-07-20 DIAGNOSIS — Z9181 History of falling: Secondary | ICD-10-CM | POA: Diagnosis not present

## 2019-07-20 DIAGNOSIS — E039 Hypothyroidism, unspecified: Secondary | ICD-10-CM | POA: Diagnosis not present

## 2019-07-20 DIAGNOSIS — R410 Disorientation, unspecified: Secondary | ICD-10-CM | POA: Diagnosis not present

## 2019-07-20 DIAGNOSIS — L97221 Non-pressure chronic ulcer of left calf limited to breakdown of skin: Secondary | ICD-10-CM | POA: Diagnosis not present

## 2019-07-20 DIAGNOSIS — I872 Venous insufficiency (chronic) (peripheral): Secondary | ICD-10-CM | POA: Diagnosis not present

## 2019-07-20 DIAGNOSIS — E559 Vitamin D deficiency, unspecified: Secondary | ICD-10-CM | POA: Diagnosis not present

## 2019-07-20 DIAGNOSIS — Z85828 Personal history of other malignant neoplasm of skin: Secondary | ICD-10-CM | POA: Diagnosis not present

## 2019-07-20 DIAGNOSIS — Z96649 Presence of unspecified artificial hip joint: Secondary | ICD-10-CM | POA: Diagnosis not present

## 2019-07-20 DIAGNOSIS — I499 Cardiac arrhythmia, unspecified: Secondary | ICD-10-CM | POA: Diagnosis not present

## 2019-07-20 DIAGNOSIS — R399 Unspecified symptoms and signs involving the genitourinary system: Secondary | ICD-10-CM

## 2019-07-20 DIAGNOSIS — I10 Essential (primary) hypertension: Secondary | ICD-10-CM | POA: Diagnosis not present

## 2019-07-20 DIAGNOSIS — M170 Bilateral primary osteoarthritis of knee: Secondary | ICD-10-CM | POA: Diagnosis not present

## 2019-07-20 DIAGNOSIS — D509 Iron deficiency anemia, unspecified: Secondary | ICD-10-CM | POA: Diagnosis not present

## 2019-07-20 DIAGNOSIS — Z7982 Long term (current) use of aspirin: Secondary | ICD-10-CM | POA: Diagnosis not present

## 2019-07-20 DIAGNOSIS — S80922D Unspecified superficial injury of left lower leg, subsequent encounter: Secondary | ICD-10-CM | POA: Diagnosis not present

## 2019-07-20 DIAGNOSIS — K219 Gastro-esophageal reflux disease without esophagitis: Secondary | ICD-10-CM | POA: Diagnosis not present

## 2019-07-20 DIAGNOSIS — M1610 Unilateral primary osteoarthritis, unspecified hip: Secondary | ICD-10-CM | POA: Diagnosis not present

## 2019-07-20 DIAGNOSIS — I4891 Unspecified atrial fibrillation: Secondary | ICD-10-CM | POA: Diagnosis not present

## 2019-07-20 LAB — URINALYSIS, COMPLETE
Bilirubin, UA: NEGATIVE
Glucose, UA: NEGATIVE
Ketones, UA: NEGATIVE
Nitrite, UA: NEGATIVE
Protein,UA: NEGATIVE
Specific Gravity, UA: 1.025 (ref 1.005–1.030)
Urobilinogen, Ur: 0.2 mg/dL (ref 0.2–1.0)
pH, UA: 6 (ref 5.0–7.5)

## 2019-07-20 LAB — MICROSCOPIC EXAMINATION: Renal Epithel, UA: NONE SEEN /hpf

## 2019-07-20 MED ORDER — CIPROFLOXACIN HCL 500 MG PO TABS: 500.0000 mg | ORAL_TABLET | Freq: Two times a day (BID) | ORAL | 0 refills | Status: DC

## 2019-07-20 NOTE — Progress Notes (Signed)
   Virtual Visit via telephone Note Due to COVID-19 pandemic this visit was conducted virtually. This visit type was conducted due to national recommendations for restrictions regarding the COVID-19 Pandemic (e.g. social distancing, sheltering in place) in an effort to limit this patient's exposure and mitigate transmission in our community. All issues noted in this document were discussed and addressed.  A physical exam was not performed with this format.  I connected with Elaine Kennedy on 07/20/19 at 11:15 by telephone and verified that I am speaking with the correct person using two identifiers. Elaine Kennedy is currently located at home and her dughter is currently with her during visit. The provider, Mary-Margaret Hassell Done, FNP is located in their office at time of visit.  I discussed the limitations, risks, security and privacy concerns of performing an evaluation and management service by telephone and the availability of in person appointments. I also discussed with the patient that there may be a patient responsible charge related to this service. The patient expressed understanding and agreed to proceed.   History and Present Illness:   Chief Complaint: Urinary Tract Infection   HPI Patients daughter calls in stating patient is acting disoriented for the last 2 days. She says that she will get ups saying she is going to the restroom and goes to the kitchen. She has need ed to go to the restroom frequently but does not pee very much.    Review of Systems  Constitutional: Negative for chills and fever.  HENT: Negative.   Respiratory: Negative.   Cardiovascular: Negative.   Genitourinary: Positive for frequency and urgency. Negative for flank pain and hematuria.  Skin: Negative.   Neurological: Negative.   Psychiatric/Behavioral: Negative.   All other systems reviewed and are negative.    Observations/Objective: Did not speak with patient because she cannot hear on the phone  Family member says she is alert and can answer questions.  Assessment and Plan: Elaine Kennedy in today with chief complaint of Urinary Tract Infection   1. UTI symptoms Take medication as prescribe Cotton underwear Take shower not bath Cranberry juice, yogurt Force fluids AZO over the counter X2 days Culture pending RTO prn Family to bring in urine specimen - urinalysis- dip and micro; Future - Urine culture; Future - ciprofloxacin (CIPRO) 500 MG tablet; Take 1 tablet (500 mg total) by mouth 2 (two) times daily.  Dispense: 10 tablet; Refill: 0  2. Disoriented [place] daily If does not improve needs face to face visit   Follow Up Instructions: prn    I discussed the assessment and treatment plan with the patient. The patient was provided an opportunity to ask questions and all were answered. The patient agreed with the plan and demonstrated an understanding of the instructions.   The patient was advised to call back or seek an in-person evaluation if the symptoms worsen or if the condition fails to improve as anticipated.  The above assessment and management plan was discussed with the patient. The patient verbalized understanding of and has agreed to the management plan. Patient is aware to call the clinic if symptoms persist or worsen. Patient is aware when to return to the clinic for a follow-up visit. Patient educated on when it is appropriate to go to the emergency department.   Time call ended:  11:25  I provided 10 minutes of non-face-to-face time during this encounter.    Mary-Margaret Hassell Done, FNP

## 2019-07-20 NOTE — Addendum Note (Signed)
Addended by: Pollyann Kennedy F on: 07/20/2019 02:16 PM   Modules accepted: Orders

## 2019-07-22 LAB — URINE CULTURE

## 2019-07-24 ENCOUNTER — Telehealth: Payer: Self-pay | Admitting: Physician Assistant

## 2019-07-24 DIAGNOSIS — Z96649 Presence of unspecified artificial hip joint: Secondary | ICD-10-CM | POA: Diagnosis not present

## 2019-07-24 DIAGNOSIS — I10 Essential (primary) hypertension: Secondary | ICD-10-CM | POA: Diagnosis not present

## 2019-07-24 DIAGNOSIS — M170 Bilateral primary osteoarthritis of knee: Secondary | ICD-10-CM | POA: Diagnosis not present

## 2019-07-24 DIAGNOSIS — S80922D Unspecified superficial injury of left lower leg, subsequent encounter: Secondary | ICD-10-CM | POA: Diagnosis not present

## 2019-07-24 DIAGNOSIS — E559 Vitamin D deficiency, unspecified: Secondary | ICD-10-CM | POA: Diagnosis not present

## 2019-07-24 DIAGNOSIS — E039 Hypothyroidism, unspecified: Secondary | ICD-10-CM | POA: Diagnosis not present

## 2019-07-24 DIAGNOSIS — K219 Gastro-esophageal reflux disease without esophagitis: Secondary | ICD-10-CM | POA: Diagnosis not present

## 2019-07-24 DIAGNOSIS — I872 Venous insufficiency (chronic) (peripheral): Secondary | ICD-10-CM | POA: Diagnosis not present

## 2019-07-24 DIAGNOSIS — Z7982 Long term (current) use of aspirin: Secondary | ICD-10-CM | POA: Diagnosis not present

## 2019-07-24 DIAGNOSIS — Z9181 History of falling: Secondary | ICD-10-CM | POA: Diagnosis not present

## 2019-07-24 DIAGNOSIS — M1610 Unilateral primary osteoarthritis, unspecified hip: Secondary | ICD-10-CM | POA: Diagnosis not present

## 2019-07-24 DIAGNOSIS — D509 Iron deficiency anemia, unspecified: Secondary | ICD-10-CM | POA: Diagnosis not present

## 2019-07-24 DIAGNOSIS — L97221 Non-pressure chronic ulcer of left calf limited to breakdown of skin: Secondary | ICD-10-CM | POA: Diagnosis not present

## 2019-07-24 DIAGNOSIS — I4891 Unspecified atrial fibrillation: Secondary | ICD-10-CM | POA: Diagnosis not present

## 2019-07-24 DIAGNOSIS — Z85828 Personal history of other malignant neoplasm of skin: Secondary | ICD-10-CM | POA: Diagnosis not present

## 2019-07-24 DIAGNOSIS — I499 Cardiac arrhythmia, unspecified: Secondary | ICD-10-CM | POA: Diagnosis not present

## 2019-07-25 NOTE — Telephone Encounter (Signed)
Pt's daughter had questions regarding the Megace and states she hasn't started giving it to her mother yet due to being worried about some of the side effects listed. She says pt has appt tomorrow and will discuss with AJ at that time.

## 2019-07-26 ENCOUNTER — Encounter: Payer: Self-pay | Admitting: Physician Assistant

## 2019-07-26 ENCOUNTER — Ambulatory Visit (INDEPENDENT_AMBULATORY_CARE_PROVIDER_SITE_OTHER): Payer: Medicare Other | Admitting: Physician Assistant

## 2019-07-26 VITALS — BP 124/68 | HR 80 | Wt 138.0 lb

## 2019-07-26 DIAGNOSIS — L03115 Cellulitis of right lower limb: Secondary | ICD-10-CM

## 2019-07-26 DIAGNOSIS — I10 Essential (primary) hypertension: Secondary | ICD-10-CM | POA: Diagnosis not present

## 2019-07-26 DIAGNOSIS — Z7982 Long term (current) use of aspirin: Secondary | ICD-10-CM | POA: Diagnosis not present

## 2019-07-26 DIAGNOSIS — N390 Urinary tract infection, site not specified: Secondary | ICD-10-CM | POA: Insufficient documentation

## 2019-07-26 DIAGNOSIS — I499 Cardiac arrhythmia, unspecified: Secondary | ICD-10-CM | POA: Diagnosis not present

## 2019-07-26 DIAGNOSIS — Z85828 Personal history of other malignant neoplasm of skin: Secondary | ICD-10-CM | POA: Diagnosis not present

## 2019-07-26 DIAGNOSIS — K219 Gastro-esophageal reflux disease without esophagitis: Secondary | ICD-10-CM | POA: Diagnosis not present

## 2019-07-26 DIAGNOSIS — I872 Venous insufficiency (chronic) (peripheral): Secondary | ICD-10-CM | POA: Diagnosis not present

## 2019-07-26 DIAGNOSIS — Z96649 Presence of unspecified artificial hip joint: Secondary | ICD-10-CM | POA: Diagnosis not present

## 2019-07-26 DIAGNOSIS — Z9181 History of falling: Secondary | ICD-10-CM | POA: Diagnosis not present

## 2019-07-26 DIAGNOSIS — L97221 Non-pressure chronic ulcer of left calf limited to breakdown of skin: Secondary | ICD-10-CM | POA: Diagnosis not present

## 2019-07-26 DIAGNOSIS — E039 Hypothyroidism, unspecified: Secondary | ICD-10-CM | POA: Diagnosis not present

## 2019-07-26 DIAGNOSIS — D509 Iron deficiency anemia, unspecified: Secondary | ICD-10-CM | POA: Diagnosis not present

## 2019-07-26 DIAGNOSIS — M6281 Muscle weakness (generalized): Secondary | ICD-10-CM

## 2019-07-26 DIAGNOSIS — S80922D Unspecified superficial injury of left lower leg, subsequent encounter: Secondary | ICD-10-CM | POA: Diagnosis not present

## 2019-07-26 DIAGNOSIS — I4891 Unspecified atrial fibrillation: Secondary | ICD-10-CM | POA: Diagnosis not present

## 2019-07-26 DIAGNOSIS — M170 Bilateral primary osteoarthritis of knee: Secondary | ICD-10-CM | POA: Diagnosis not present

## 2019-07-26 DIAGNOSIS — E559 Vitamin D deficiency, unspecified: Secondary | ICD-10-CM | POA: Diagnosis not present

## 2019-07-26 DIAGNOSIS — M1610 Unilateral primary osteoarthritis, unspecified hip: Secondary | ICD-10-CM | POA: Diagnosis not present

## 2019-07-26 NOTE — Progress Notes (Signed)
Telephone visit  Subjective: [id number] PCP: Terald Sleeper, PA-C OU:[id number] L Ruggles is a 85 y.o. female calls for telephone consult today. Patient provides verbal consent for consult held via phone.  Patient is identified with 2 separate identifiers.  At this time the entire area is on COVID-19 social distancing and stay home orders are in place.  Patient is of higher risk and therefore we are performing this by a virtual method.  Location of patient: home Location of provider: HOME Others present for call: daughter Bethena Roys   Extremity Weakness  This is a chronic problem. The current episode started more than 1 year ago. There has been no history of extremity trauma. The problem occurs daily. The problem has been waxing and waning. She has tried nothing for the symptoms.   Insomnia Primary symptoms: no somnolence, napping.  The current episode started more than one month. The onset quality is gradual. The problem occurs intermittently. The problem has been waxing and waning since onset. How many beverages per day that contain caffeine: 0 - 1.  Types of beverages you drink: other. Duration of naps:  One to two hours.  Prior diagnostic workup includes:  No prior workup.   Dysuria  This is a chronic problem. The current episode started more than 1 month ago. The problem occurs intermittently. There has been no fever. She is not sexually active. There is no history of pyelonephritis. Associated symptoms include chills, frequency and vomiting. She has tried antibiotics for the symptoms.    ROS: Per HPI  Allergies  Allergen Reactions  . Sulfa Antibiotics Diarrhea   Past Medical History:  Diagnosis Date  . Arthritis   . Cancer ([place])    skin cancer on head.  [name] Dysrhythmia    AFib  . GERD (gastroesophageal reflux disease)   . Hypothyroidism     Current Outpatient Medications:  .  acetaminophen (TYLENOL) 650 MG CR tablet, Take 650 mg by mouth every 8 (eight) hours as needed for  pain. , Disp: , Rfl:  .  aspirin EC 81 MG tablet, Take 81 mg by mouth daily., Disp: , Rfl:  .  Baclofen 5 MG TABS, Take 5 mg by mouth 3 (three) times daily as needed., Disp: 30 tablet, Rfl: 0 .  cholecalciferol (VITAMIN D) 1000 units tablet, Take 1,000 Units by mouth daily., Disp: , Rfl:  .  diclofenac sodium (VOLTAREN) 1 % GEL, Apply 4 g topically 4 (four) times daily. FOR KNEE arthritis, Disp: 200 g, Rfl: 11 .  ferrous sulfate 325 (65 FE) MG tablet, Take 325 mg by mouth daily with breakfast., Disp: , Rfl:  .  levothyroxine (SYNTHROID) 88 MCG tablet, TAKE ONE TABLET (88 MCG TOTAL) BY MOUTH DAILY., Disp: 90 tablet, Rfl: 1 .  megestrol (MEGACE) 20 MG tablet, Take 1 tablet (20 mg total) by mouth daily. For appetite stimulation, Disp: 30 tablet, Rfl: 2 .  metoprolol succinate (TOPROL-XL) 50 MG 24 hr tablet, TAKE 1 TABLET BY MOUTH EVERY DAY, Disp: 90 tablet, Rfl: 0 .  Multiple Vitamins-Minerals (MULTIVITAMIN WITH MINERALS) tablet, Take 1 tablet by mouth daily. Spectra-vite, Disp: , Rfl:  .  omeprazole (PRILOSEC) 40 MG capsule, Take 1 capsule (40 mg total) by mouth daily., Disp: 90 capsule, Rfl: 1 .  Polyethylene Glycol 400 (BLINK TEARS OP), Place 1 drop into both eyes 3 (three) times daily as needed (dry eyes)., Disp: , Rfl:   Assessment/ Plan: 85 y.o. female   1. Recurrent UTI - Urine culture; Standing -  urinalysis- dip and micro; Standing  2. Muscle weakness (generalized) Watch and wait Continue home PT and OT that is currently going on  3. Cellulitis of right lower leg resolving    No follow-ups on file.  Continue all other maintenance medications as listed above.  Start time: 2:08 pm End time: 2/25 PM  No orders of the defined types were placed in this encounter.   Particia Nearing PA-C [place] [phone]

## 2019-07-27 DIAGNOSIS — W19XXXA Unspecified fall, initial encounter: Secondary | ICD-10-CM | POA: Diagnosis not present

## 2019-07-27 DIAGNOSIS — L97221 Non-pressure chronic ulcer of left calf limited to breakdown of skin: Secondary | ICD-10-CM | POA: Diagnosis not present

## 2019-07-27 DIAGNOSIS — E559 Vitamin D deficiency, unspecified: Secondary | ICD-10-CM | POA: Diagnosis not present

## 2019-07-27 DIAGNOSIS — I4891 Unspecified atrial fibrillation: Secondary | ICD-10-CM | POA: Diagnosis not present

## 2019-07-27 DIAGNOSIS — M170 Bilateral primary osteoarthritis of knee: Secondary | ICD-10-CM | POA: Diagnosis not present

## 2019-07-27 DIAGNOSIS — D509 Iron deficiency anemia, unspecified: Secondary | ICD-10-CM | POA: Diagnosis not present

## 2019-07-27 DIAGNOSIS — Z85828 Personal history of other malignant neoplasm of skin: Secondary | ICD-10-CM | POA: Diagnosis not present

## 2019-07-27 DIAGNOSIS — Z9181 History of falling: Secondary | ICD-10-CM | POA: Diagnosis not present

## 2019-07-27 DIAGNOSIS — Z7982 Long term (current) use of aspirin: Secondary | ICD-10-CM | POA: Diagnosis not present

## 2019-07-27 DIAGNOSIS — I872 Venous insufficiency (chronic) (peripheral): Secondary | ICD-10-CM | POA: Diagnosis not present

## 2019-07-27 DIAGNOSIS — I10 Essential (primary) hypertension: Secondary | ICD-10-CM | POA: Diagnosis not present

## 2019-07-27 DIAGNOSIS — E039 Hypothyroidism, unspecified: Secondary | ICD-10-CM | POA: Diagnosis not present

## 2019-07-27 DIAGNOSIS — Z96649 Presence of unspecified artificial hip joint: Secondary | ICD-10-CM | POA: Diagnosis not present

## 2019-07-27 DIAGNOSIS — M1610 Unilateral primary osteoarthritis, unspecified hip: Secondary | ICD-10-CM | POA: Diagnosis not present

## 2019-07-27 DIAGNOSIS — K219 Gastro-esophageal reflux disease without esophagitis: Secondary | ICD-10-CM | POA: Diagnosis not present

## 2019-07-27 DIAGNOSIS — I499 Cardiac arrhythmia, unspecified: Secondary | ICD-10-CM | POA: Diagnosis not present

## 2019-07-27 DIAGNOSIS — S80922D Unspecified superficial injury of left lower leg, subsequent encounter: Secondary | ICD-10-CM | POA: Diagnosis not present

## 2019-07-27 DIAGNOSIS — T1490XA Injury, unspecified, initial encounter: Secondary | ICD-10-CM | POA: Diagnosis not present

## 2019-07-27 DIAGNOSIS — R5381 Other malaise: Secondary | ICD-10-CM | POA: Diagnosis not present

## 2019-07-31 DIAGNOSIS — Z7982 Long term (current) use of aspirin: Secondary | ICD-10-CM | POA: Diagnosis not present

## 2019-07-31 DIAGNOSIS — M170 Bilateral primary osteoarthritis of knee: Secondary | ICD-10-CM | POA: Diagnosis not present

## 2019-07-31 DIAGNOSIS — L97221 Non-pressure chronic ulcer of left calf limited to breakdown of skin: Secondary | ICD-10-CM | POA: Diagnosis not present

## 2019-07-31 DIAGNOSIS — I4891 Unspecified atrial fibrillation: Secondary | ICD-10-CM | POA: Diagnosis not present

## 2019-07-31 DIAGNOSIS — Z85828 Personal history of other malignant neoplasm of skin: Secondary | ICD-10-CM | POA: Diagnosis not present

## 2019-07-31 DIAGNOSIS — Z9181 History of falling: Secondary | ICD-10-CM | POA: Diagnosis not present

## 2019-07-31 DIAGNOSIS — S80922D Unspecified superficial injury of left lower leg, subsequent encounter: Secondary | ICD-10-CM | POA: Diagnosis not present

## 2019-07-31 DIAGNOSIS — D509 Iron deficiency anemia, unspecified: Secondary | ICD-10-CM | POA: Diagnosis not present

## 2019-07-31 DIAGNOSIS — I499 Cardiac arrhythmia, unspecified: Secondary | ICD-10-CM | POA: Diagnosis not present

## 2019-07-31 DIAGNOSIS — E039 Hypothyroidism, unspecified: Secondary | ICD-10-CM | POA: Diagnosis not present

## 2019-07-31 DIAGNOSIS — M1610 Unilateral primary osteoarthritis, unspecified hip: Secondary | ICD-10-CM | POA: Diagnosis not present

## 2019-07-31 DIAGNOSIS — E559 Vitamin D deficiency, unspecified: Secondary | ICD-10-CM | POA: Diagnosis not present

## 2019-07-31 DIAGNOSIS — Z96649 Presence of unspecified artificial hip joint: Secondary | ICD-10-CM | POA: Diagnosis not present

## 2019-07-31 DIAGNOSIS — I872 Venous insufficiency (chronic) (peripheral): Secondary | ICD-10-CM | POA: Diagnosis not present

## 2019-07-31 DIAGNOSIS — I10 Essential (primary) hypertension: Secondary | ICD-10-CM | POA: Diagnosis not present

## 2019-07-31 DIAGNOSIS — K219 Gastro-esophageal reflux disease without esophagitis: Secondary | ICD-10-CM | POA: Diagnosis not present

## 2019-08-01 DIAGNOSIS — I872 Venous insufficiency (chronic) (peripheral): Secondary | ICD-10-CM | POA: Diagnosis not present

## 2019-08-01 DIAGNOSIS — Z85828 Personal history of other malignant neoplasm of skin: Secondary | ICD-10-CM | POA: Diagnosis not present

## 2019-08-01 DIAGNOSIS — D509 Iron deficiency anemia, unspecified: Secondary | ICD-10-CM | POA: Diagnosis not present

## 2019-08-01 DIAGNOSIS — M170 Bilateral primary osteoarthritis of knee: Secondary | ICD-10-CM | POA: Diagnosis not present

## 2019-08-01 DIAGNOSIS — S80922D Unspecified superficial injury of left lower leg, subsequent encounter: Secondary | ICD-10-CM | POA: Diagnosis not present

## 2019-08-01 DIAGNOSIS — E559 Vitamin D deficiency, unspecified: Secondary | ICD-10-CM | POA: Diagnosis not present

## 2019-08-01 DIAGNOSIS — K219 Gastro-esophageal reflux disease without esophagitis: Secondary | ICD-10-CM | POA: Diagnosis not present

## 2019-08-01 DIAGNOSIS — I4891 Unspecified atrial fibrillation: Secondary | ICD-10-CM | POA: Diagnosis not present

## 2019-08-01 DIAGNOSIS — E039 Hypothyroidism, unspecified: Secondary | ICD-10-CM | POA: Diagnosis not present

## 2019-08-01 DIAGNOSIS — I10 Essential (primary) hypertension: Secondary | ICD-10-CM | POA: Diagnosis not present

## 2019-08-01 DIAGNOSIS — Z9181 History of falling: Secondary | ICD-10-CM | POA: Diagnosis not present

## 2019-08-01 DIAGNOSIS — Z7982 Long term (current) use of aspirin: Secondary | ICD-10-CM | POA: Diagnosis not present

## 2019-08-01 DIAGNOSIS — Z96649 Presence of unspecified artificial hip joint: Secondary | ICD-10-CM | POA: Diagnosis not present

## 2019-08-01 DIAGNOSIS — M1610 Unilateral primary osteoarthritis, unspecified hip: Secondary | ICD-10-CM | POA: Diagnosis not present

## 2019-08-01 DIAGNOSIS — I499 Cardiac arrhythmia, unspecified: Secondary | ICD-10-CM | POA: Diagnosis not present

## 2019-08-01 DIAGNOSIS — L97221 Non-pressure chronic ulcer of left calf limited to breakdown of skin: Secondary | ICD-10-CM | POA: Diagnosis not present

## 2019-08-02 ENCOUNTER — Encounter: Payer: Self-pay | Admitting: Physician Assistant

## 2019-08-02 ENCOUNTER — Ambulatory Visit (INDEPENDENT_AMBULATORY_CARE_PROVIDER_SITE_OTHER): Payer: Medicare Other | Admitting: Physician Assistant

## 2019-08-02 DIAGNOSIS — R600 Localized edema: Secondary | ICD-10-CM | POA: Insufficient documentation

## 2019-08-02 DIAGNOSIS — N390 Urinary tract infection, site not specified: Secondary | ICD-10-CM

## 2019-08-02 MED ORDER — HYDROCHLOROTHIAZIDE 12.5 MG PO TABS: 12.5000 mg | ORAL_TABLET | Freq: Every day | ORAL | 1 refills | Status: DC | PRN

## 2019-08-02 MED ORDER — CEPHALEXIN 500 MG PO CAPS: 500.0000 mg | ORAL_CAPSULE | Freq: Two times a day (BID) | ORAL | 0 refills | Status: DC

## 2019-08-02 NOTE — Progress Notes (Signed)
Telephone visit  Subjective: CC:uti an dswelling PCP: Elaine Sleeper, PA-C [id number] Elaine Kennedy is a 85 y.o. female calls for telephone consult today. Patient provides verbal consent for consult held via phone.  Patient is identified with 2 separate identifiers.  At this time the entire area is on COVID-19 social distancing and stay home orders are in place.  Patient is of higher risk and therefore we are performing this by a virtual method.  Location of patient: home Location of provider: HOME Others present for call: Bethena Roys  Patient with known recurrent UTI issues.  This patient has had several days of dysuria, frequency and nocturia. There is also pain over the bladder in the suprapubic region, no back pain. Denies leakage or hematuria.  Denies fever or chills. No pain in flank area.  There is a strong odor to her urine which is usually the sign for an infection for them.  Home health had been working with her.  She has 1 or 2 weeks left with the nurse and physical therapist.  Her daughter reports that she has been a little more active and getting a little stronger.  She is not had any falls lately.  She is however little more confused since the bladder infection has happened.  Edema in both lower legs.  We will try a fluid pill, but only had a half dose.  And they will only use it as needed for the next 3 to 4 days.   ROS: Per HPI  Allergies  Allergen Reactions  . Sulfa Antibiotics Diarrhea   Past Medical History:  Diagnosis Date  . Arthritis   . Cancer ([place])    skin cancer on head.  [name] Dysrhythmia    AFib  . GERD (gastroesophageal reflux disease)   . Hypothyroidism     Current Outpatient Medications:  .  acetaminophen (TYLENOL) 650 MG CR tablet, Take 650 mg by mouth every 8 (eight) hours as needed for pain. , Disp: , Rfl:  .  aspirin EC 81 MG tablet, Take 81 mg by mouth daily., Disp: , Rfl:  .  Baclofen 5 MG TABS, Take 5 mg by mouth 3 (three) times daily as needed.,  Disp: 30 tablet, Rfl: 0 .  cephALEXin (KEFLEX) 500 MG capsule, Take 1 capsule (500 mg total) by mouth 2 (two) times daily., Disp: 20 capsule, Rfl: 0 .  cholecalciferol (VITAMIN D) 1000 units tablet, Take 1,000 Units by mouth daily., Disp: , Rfl:  .  diclofenac sodium (VOLTAREN) 1 % GEL, Apply 4 g topically 4 (four) times daily. FOR KNEE arthritis, Disp: 200 g, Rfl: 11 .  ferrous sulfate 325 (65 FE) MG tablet, Take 325 mg by mouth daily with breakfast., Disp: , Rfl:  .  hydrochlorothiazide (HYDRODIURIL) 12.5 MG tablet, Take 1 tablet (12.5 mg total) by mouth daily as needed (swelling)., Disp: 30 tablet, Rfl: 1 .  levothyroxine (SYNTHROID) 88 MCG tablet, TAKE ONE TABLET (88 MCG TOTAL) BY MOUTH DAILY., Disp: 90 tablet, Rfl: 1 .  megestrol (MEGACE) 20 MG tablet, Take 1 tablet (20 mg total) by mouth daily. For appetite stimulation, Disp: 30 tablet, Rfl: 2 .  metoprolol succinate (TOPROL-XL) 50 MG 24 hr tablet, TAKE 1 TABLET BY MOUTH EVERY DAY, Disp: 90 tablet, Rfl: 0 .  Multiple Vitamins-Minerals (MULTIVITAMIN WITH MINERALS) tablet, Take 1 tablet by mouth daily. Spectra-vite, Disp: , Rfl:  .  omeprazole (PRILOSEC) 40 MG capsule, Take 1 capsule (40 mg total) by mouth daily., Disp: 90 capsule, Rfl:  1 .  Polyethylene Glycol 400 (BLINK TEARS OP), Place 1 drop into both eyes 3 (three) times daily as needed (dry eyes)., Disp: , Rfl:   Assessment/ Plan: 85 y.o. female   1. Recurrent UTI - cephALEXin (KEFLEX) 500 MG capsule; Take 1 capsule (500 mg total) by mouth 2 (two) times daily.  Dispense: 20 capsule; Refill: 0  2. Edema leg - hydrochlorothiazide (HYDRODIURIL) 12.5 MG tablet; Take 1 tablet (12.5 mg total) by mouth daily as needed (swelling).  Dispense: 30 tablet; Refill: 1    Return if symptoms worsen or fail to improve.  Continue all other maintenance medications as listed above.  Start time: 10:11 AM End time: 10:23 AM  Meds ordered this encounter  Medications  . cephALEXin (KEFLEX) 500 MG  capsule    Sig: Take 1 capsule (500 mg total) by mouth 2 (two) times daily.    Dispense:  20 capsule    Refill:  0    Order Specific Question:   Supervising Provider    Answer:   [name] [id number]  . hydrochlorothiazide (HYDRODIURIL) 12.5 MG tablet    Sig: Take 1 tablet (12.5 mg total) by mouth daily as needed (swelling).    Dispense:  30 tablet    Refill:  1    Order Specific Question:   Supervising Provider    Answer:   [name] [id number]    Particia Nearing PA-C [place] [phone]

## 2019-08-03 DIAGNOSIS — S80922D Unspecified superficial injury of left lower leg, subsequent encounter: Secondary | ICD-10-CM | POA: Diagnosis not present

## 2019-08-03 DIAGNOSIS — K219 Gastro-esophageal reflux disease without esophagitis: Secondary | ICD-10-CM | POA: Diagnosis not present

## 2019-08-03 DIAGNOSIS — Z96649 Presence of unspecified artificial hip joint: Secondary | ICD-10-CM | POA: Diagnosis not present

## 2019-08-03 DIAGNOSIS — Z7982 Long term (current) use of aspirin: Secondary | ICD-10-CM | POA: Diagnosis not present

## 2019-08-03 DIAGNOSIS — Z85828 Personal history of other malignant neoplasm of skin: Secondary | ICD-10-CM | POA: Diagnosis not present

## 2019-08-03 DIAGNOSIS — Z9181 History of falling: Secondary | ICD-10-CM | POA: Diagnosis not present

## 2019-08-03 DIAGNOSIS — I499 Cardiac arrhythmia, unspecified: Secondary | ICD-10-CM | POA: Diagnosis not present

## 2019-08-03 DIAGNOSIS — I4891 Unspecified atrial fibrillation: Secondary | ICD-10-CM | POA: Diagnosis not present

## 2019-08-03 DIAGNOSIS — I872 Venous insufficiency (chronic) (peripheral): Secondary | ICD-10-CM | POA: Diagnosis not present

## 2019-08-03 DIAGNOSIS — E039 Hypothyroidism, unspecified: Secondary | ICD-10-CM | POA: Diagnosis not present

## 2019-08-03 DIAGNOSIS — L97221 Non-pressure chronic ulcer of left calf limited to breakdown of skin: Secondary | ICD-10-CM | POA: Diagnosis not present

## 2019-08-03 DIAGNOSIS — E559 Vitamin D deficiency, unspecified: Secondary | ICD-10-CM | POA: Diagnosis not present

## 2019-08-03 DIAGNOSIS — D509 Iron deficiency anemia, unspecified: Secondary | ICD-10-CM | POA: Diagnosis not present

## 2019-08-03 DIAGNOSIS — M1610 Unilateral primary osteoarthritis, unspecified hip: Secondary | ICD-10-CM | POA: Diagnosis not present

## 2019-08-03 DIAGNOSIS — I10 Essential (primary) hypertension: Secondary | ICD-10-CM | POA: Diagnosis not present

## 2019-08-03 DIAGNOSIS — M170 Bilateral primary osteoarthritis of knee: Secondary | ICD-10-CM | POA: Diagnosis not present

## 2019-08-07 ENCOUNTER — Ambulatory Visit: Payer: Medicare Other | Admitting: Physician Assistant

## 2019-08-07 DIAGNOSIS — Z9181 History of falling: Secondary | ICD-10-CM | POA: Diagnosis not present

## 2019-08-07 DIAGNOSIS — I499 Cardiac arrhythmia, unspecified: Secondary | ICD-10-CM | POA: Diagnosis not present

## 2019-08-07 DIAGNOSIS — K219 Gastro-esophageal reflux disease without esophagitis: Secondary | ICD-10-CM | POA: Diagnosis not present

## 2019-08-07 DIAGNOSIS — E559 Vitamin D deficiency, unspecified: Secondary | ICD-10-CM | POA: Diagnosis not present

## 2019-08-07 DIAGNOSIS — L97221 Non-pressure chronic ulcer of left calf limited to breakdown of skin: Secondary | ICD-10-CM | POA: Diagnosis not present

## 2019-08-07 DIAGNOSIS — Z96649 Presence of unspecified artificial hip joint: Secondary | ICD-10-CM | POA: Diagnosis not present

## 2019-08-07 DIAGNOSIS — M1610 Unilateral primary osteoarthritis, unspecified hip: Secondary | ICD-10-CM | POA: Diagnosis not present

## 2019-08-07 DIAGNOSIS — E039 Hypothyroidism, unspecified: Secondary | ICD-10-CM | POA: Diagnosis not present

## 2019-08-07 DIAGNOSIS — M170 Bilateral primary osteoarthritis of knee: Secondary | ICD-10-CM | POA: Diagnosis not present

## 2019-08-07 DIAGNOSIS — I4891 Unspecified atrial fibrillation: Secondary | ICD-10-CM | POA: Diagnosis not present

## 2019-08-07 DIAGNOSIS — S80922D Unspecified superficial injury of left lower leg, subsequent encounter: Secondary | ICD-10-CM | POA: Diagnosis not present

## 2019-08-07 DIAGNOSIS — Z7982 Long term (current) use of aspirin: Secondary | ICD-10-CM | POA: Diagnosis not present

## 2019-08-07 DIAGNOSIS — I872 Venous insufficiency (chronic) (peripheral): Secondary | ICD-10-CM | POA: Diagnosis not present

## 2019-08-07 DIAGNOSIS — Z85828 Personal history of other malignant neoplasm of skin: Secondary | ICD-10-CM | POA: Diagnosis not present

## 2019-08-07 DIAGNOSIS — D509 Iron deficiency anemia, unspecified: Secondary | ICD-10-CM | POA: Diagnosis not present

## 2019-08-07 DIAGNOSIS — I10 Essential (primary) hypertension: Secondary | ICD-10-CM | POA: Diagnosis not present

## 2019-08-10 ENCOUNTER — Other Ambulatory Visit: Payer: Self-pay | Admitting: Physician Assistant

## 2019-08-10 DIAGNOSIS — Z96649 Presence of unspecified artificial hip joint: Secondary | ICD-10-CM | POA: Diagnosis not present

## 2019-08-10 DIAGNOSIS — Z85828 Personal history of other malignant neoplasm of skin: Secondary | ICD-10-CM | POA: Diagnosis not present

## 2019-08-10 DIAGNOSIS — I499 Cardiac arrhythmia, unspecified: Secondary | ICD-10-CM | POA: Diagnosis not present

## 2019-08-10 DIAGNOSIS — M170 Bilateral primary osteoarthritis of knee: Secondary | ICD-10-CM | POA: Diagnosis not present

## 2019-08-10 DIAGNOSIS — I10 Essential (primary) hypertension: Secondary | ICD-10-CM

## 2019-08-10 DIAGNOSIS — I4891 Unspecified atrial fibrillation: Secondary | ICD-10-CM | POA: Diagnosis not present

## 2019-08-10 DIAGNOSIS — K219 Gastro-esophageal reflux disease without esophagitis: Secondary | ICD-10-CM | POA: Diagnosis not present

## 2019-08-10 DIAGNOSIS — E559 Vitamin D deficiency, unspecified: Secondary | ICD-10-CM | POA: Diagnosis not present

## 2019-08-10 DIAGNOSIS — D509 Iron deficiency anemia, unspecified: Secondary | ICD-10-CM | POA: Diagnosis not present

## 2019-08-10 DIAGNOSIS — M1610 Unilateral primary osteoarthritis, unspecified hip: Secondary | ICD-10-CM | POA: Diagnosis not present

## 2019-08-10 DIAGNOSIS — Z7982 Long term (current) use of aspirin: Secondary | ICD-10-CM | POA: Diagnosis not present

## 2019-08-10 DIAGNOSIS — E039 Hypothyroidism, unspecified: Secondary | ICD-10-CM | POA: Diagnosis not present

## 2019-08-10 DIAGNOSIS — Z9181 History of falling: Secondary | ICD-10-CM | POA: Diagnosis not present

## 2019-08-10 DIAGNOSIS — I872 Venous insufficiency (chronic) (peripheral): Secondary | ICD-10-CM | POA: Diagnosis not present

## 2019-08-10 DIAGNOSIS — S80922D Unspecified superficial injury of left lower leg, subsequent encounter: Secondary | ICD-10-CM | POA: Diagnosis not present

## 2019-08-10 DIAGNOSIS — L97221 Non-pressure chronic ulcer of left calf limited to breakdown of skin: Secondary | ICD-10-CM | POA: Diagnosis not present

## 2019-08-16 ENCOUNTER — Other Ambulatory Visit: Payer: Self-pay

## 2019-08-16 NOTE — Patient Outreach (Signed)
[facility]) Care Management  08/16/2019  Elaine Kennedy 20-Aug-1926 [id number]   Medication Adherence call to [name] spoke with daughter, patient is past due on Lisinopril 10 mg,daughter explain patient is no longer taking this medication patient was having side effects,patient is now taking metoprolol for blood pressure. [name] is showing past due under [place].  [facility] Direct Dial [phone]  Fax [phone] [email or web address]

## 2019-08-18 DIAGNOSIS — Z9181 History of falling: Secondary | ICD-10-CM | POA: Diagnosis not present

## 2019-08-18 DIAGNOSIS — M6281 Muscle weakness (generalized): Secondary | ICD-10-CM | POA: Diagnosis not present

## 2019-08-19 ENCOUNTER — Other Ambulatory Visit: Payer: Self-pay | Admitting: Physician Assistant

## 2019-08-20 DIAGNOSIS — R0902 Hypoxemia: Secondary | ICD-10-CM | POA: Diagnosis not present

## 2019-08-20 DIAGNOSIS — W19XXXA Unspecified fall, initial encounter: Secondary | ICD-10-CM | POA: Diagnosis not present

## 2019-08-20 DIAGNOSIS — I10 Essential (primary) hypertension: Secondary | ICD-10-CM | POA: Diagnosis not present

## 2019-08-21 ENCOUNTER — Telehealth: Payer: Self-pay | Admitting: Physician Assistant

## 2019-08-21 ENCOUNTER — Other Ambulatory Visit: Payer: Medicare Other

## 2019-08-21 DIAGNOSIS — N390 Urinary tract infection, site not specified: Secondary | ICD-10-CM | POA: Diagnosis not present

## 2019-08-21 LAB — URINALYSIS, COMPLETE
Bilirubin, UA: NEGATIVE
Glucose, UA: NEGATIVE
Ketones, UA: NEGATIVE
Leukocytes,UA: NEGATIVE
Nitrite, UA: NEGATIVE
Protein,UA: NEGATIVE
RBC, UA: NEGATIVE
Specific Gravity, UA: <1.005 — ABNORMAL LOW (ref 1.005–1.030)
Urobilinogen, Ur: 0.2 mg/dL (ref 0.2–1.0)
pH, UA: 6 (ref 5.0–7.5)

## 2019-08-21 LAB — MICROSCOPIC EXAMINATION
Bacteria, UA: NONE SEEN
RBC, Urine: NONE SEEN /hpf (ref 0–2)
Renal Epithel, UA: NONE SEEN /hpf
WBC, UA: NONE SEEN /hpf (ref 0–5)

## 2019-08-21 NOTE — Telephone Encounter (Signed)
If they feel that she is significantly dehydrated it is okay for them to go to an urgent care to get rehydrated.  And is she drinking properly?

## 2019-08-21 NOTE — Telephone Encounter (Signed)
Pt is having multiple falls at least once a week and fell again this week and feels confused. Should she be seen and followed up with? Does the provider feel she needs 24 hour care? Does she need 24 hour standby assistance? Case manager has advised the pt and family there are some repairs that should be done to the house to make it safer but pt declines. What is the best thing to do to keep pt from falling and getting hurt at this point? The daughter is with the pt 24/7 except when she runs errands for the pt. Does she need bloodwork? Please advise.

## 2019-08-22 NOTE — Telephone Encounter (Signed)
If she cannot increase her intake, yes okay to do UC

## 2019-08-22 NOTE — Telephone Encounter (Signed)
Spoke to pt's daughter regarding dehydration? Pt is only drinking 24oz of water daily, cup of coffee and has 1/2 ensure daily. Pt's daughter will discuss with family about taking her to Urgent Care for evaluation of dehydration.

## 2019-08-22 NOTE — Telephone Encounter (Signed)
Advised pt's daughter of provider feedback and she says the pt is refusing to go to the UC, her UA micro yesterday looked clean-still waiting for culture so not sure. Advised her daughter to try and push fluids but if she can't get her to drink more she really needs to be evaluated at [facility]. Daughter voiced understanding.

## 2019-08-23 LAB — URINE CULTURE

## 2019-08-24 ENCOUNTER — Telehealth: Payer: Self-pay | Admitting: Physician Assistant

## 2019-08-24 ENCOUNTER — Other Ambulatory Visit: Payer: Self-pay | Admitting: Physician Assistant

## 2019-08-24 DIAGNOSIS — R600 Localized edema: Secondary | ICD-10-CM

## 2019-08-24 NOTE — Telephone Encounter (Signed)
Written Rx placed on providers desk 

## 2019-08-28 ENCOUNTER — Telehealth: Payer: Self-pay | Admitting: Physician Assistant

## 2019-08-28 ENCOUNTER — Other Ambulatory Visit: Payer: Self-pay | Admitting: Physician Assistant

## 2019-08-28 DIAGNOSIS — Z9181 History of falling: Secondary | ICD-10-CM

## 2019-08-28 DIAGNOSIS — R413 Other amnesia: Secondary | ICD-10-CM

## 2019-08-28 NOTE — Telephone Encounter (Signed)
Signed and faxed to [facility]

## 2019-08-28 NOTE — Telephone Encounter (Signed)
Lmtcb.

## 2019-08-28 NOTE — Telephone Encounter (Signed)
Do phone tomorrow

## 2019-08-28 NOTE — Telephone Encounter (Signed)
Rx faxed to [facility] on 08/25/19

## 2019-08-28 NOTE — Telephone Encounter (Signed)
Televisit appt made.  Daughter aware

## 2019-08-28 NOTE — Telephone Encounter (Signed)
Patients daughter states case worker told her to call and see if we could call something in for moods she gets very agitated. Please advise need phone visit or come in office visit?

## 2019-08-28 NOTE — Telephone Encounter (Signed)
Printed on back printer

## 2019-08-29 ENCOUNTER — Ambulatory Visit (INDEPENDENT_AMBULATORY_CARE_PROVIDER_SITE_OTHER): Payer: Medicare Other | Admitting: Physician Assistant

## 2019-08-29 ENCOUNTER — Encounter: Payer: Self-pay | Admitting: Physician Assistant

## 2019-08-29 DIAGNOSIS — L089 Local infection of the skin and subcutaneous tissue, unspecified: Secondary | ICD-10-CM

## 2019-08-29 DIAGNOSIS — I872 Venous insufficiency (chronic) (peripheral): Secondary | ICD-10-CM

## 2019-08-29 DIAGNOSIS — M6281 Muscle weakness (generalized): Secondary | ICD-10-CM | POA: Diagnosis not present

## 2019-08-29 DIAGNOSIS — S80921A Unspecified superficial injury of right lower leg, initial encounter: Secondary | ICD-10-CM | POA: Diagnosis not present

## 2019-08-29 DIAGNOSIS — Z9181 History of falling: Secondary | ICD-10-CM | POA: Diagnosis not present

## 2019-08-29 DIAGNOSIS — L989 Disorder of the skin and subcutaneous tissue, unspecified: Secondary | ICD-10-CM

## 2019-08-29 NOTE — Progress Notes (Signed)
[id number]     Telephone visit  Subjective: CC: Recheck on chronic conditions PCP: Terald Sleeper, PA-C OU:[id number] Elaine Kennedy is a 86 y.o. female calls for telephone consult today. Patient provides verbal consent for consult held via phone.  Patient is identified with 2 separate identifiers.  At this time the entire area is on COVID-19 social distancing and stay home orders are in place.  Patient is of higher risk and therefore we are performing this by a virtual method.  Location of patient:   Location of provider: HOME Others present for call: Daughter and granddaughter who are caretakers  Patient is having a visit by telephone today for recheck on her chronic conditions of chronic stasis dermatitis, recurrent cellulitis, high risk for falls, generalized muscle weakness.  She does currently have a lesion on her right lower leg is having a little bit of trouble healing.  Her granddaughter who is a Marine scientist is helping to bandage it daily.  They will call in a week if things are not better.  She has had wound care in the past through the home health agency.  The patient has also had physical therapy through the home health agency.  It does help her for a while but then she will get weaker again.  It has always been very beneficial.  We will plan to put an order back in for this.  I have tried to encourage the patient to realize how fortunate she is that she has 2 family members that are willing to help her and be at home with her that many people do not have this.  Have encouraged her to try to work hard at what she is doing so that she can continue to stay home and not have to go to the hospital or nursing home.      ROS: Per HPI  Allergies  Allergen Reactions  . Sulfa Antibiotics Diarrhea   Past Medical History:  Diagnosis Date  . Arthritis   . Cancer ([place])    skin cancer on head.  [name] Dysrhythmia    AFib  . GERD (gastroesophageal reflux disease)   . Hypothyroidism     Current  Outpatient Medications:  .  acetaminophen (TYLENOL) 650 MG CR tablet, Take 650 mg by mouth every 8 (eight) hours as needed for pain. , Disp: , Rfl:  .  aspirin EC 81 MG tablet, Take 81 mg by mouth daily., Disp: , Rfl:  .  Baclofen 5 MG TABS, Take 5 mg by mouth 3 (three) times daily as needed., Disp: 30 tablet, Rfl: 0 .  cholecalciferol (VITAMIN D) 1000 units tablet, Take 1,000 Units by mouth daily., Disp: , Rfl:  .  diclofenac sodium (VOLTAREN) 1 % GEL, Apply 4 g topically 4 (four) times daily. FOR KNEE arthritis, Disp: 200 g, Rfl: 11 .  ferrous sulfate 325 (65 FE) MG tablet, Take 325 mg by mouth daily with breakfast., Disp: , Rfl:  .  hydrochlorothiazide (HYDRODIURIL) 12.5 MG tablet, TAKE 1 TABLET (12.5 MG TOTAL) BY MOUTH DAILY AS NEEDED (SWELLING)., Disp: 30 tablet, Rfl: 0 .  levothyroxine (SYNTHROID) 88 MCG tablet, TAKE ONE TABLET (88 MCG TOTAL) BY MOUTH DAILY., Disp: 90 tablet, Rfl: 0 .  megestrol (MEGACE) 20 MG tablet, Take 1 tablet (20 mg total) by mouth daily. For appetite stimulation, Disp: 30 tablet, Rfl: 2 .  metoprolol succinate (TOPROL-XL) 50 MG 24 hr tablet, TAKE 1 TABLET BY MOUTH EVERY DAY, Disp: 90 tablet, Rfl: 0 .  Multiple  Vitamins-Minerals (MULTIVITAMIN WITH MINERALS) tablet, Take 1 tablet by mouth daily. Spectra-vite, Disp: , Rfl:  .  omeprazole (PRILOSEC) 40 MG capsule, Take 1 capsule (40 mg total) by mouth daily., Disp: 90 capsule, Rfl: 1 .  Polyethylene Glycol 400 (BLINK TEARS OP), Place 1 drop into both eyes 3 (three) times daily as needed (dry eyes)., Disp: , Rfl:   Assessment/ Plan: 86 y.o. female   1. Wound infection Continue daily bandaging Report in 1 week Consider wound care if needed  2. Non-healing skin lesion See above  3. Chronic stasis dermatitis See above  4. Muscle weakness (generalized) Resume physical therapy through home health  5. At high risk for falls Resume physical therapy through home health   Return in about 4 weeks (around 09/26/2019)  for recheck PHONE okay.  Continue all other maintenance medications as listed above.  Start time: 2:40 PM End time: 3:01 PM  No orders of the defined types were placed in this encounter.   Particia Nearing PA-C [place] [phone]

## 2019-08-30 ENCOUNTER — Telehealth: Payer: Self-pay | Admitting: Physician Assistant

## 2019-08-30 NOTE — Telephone Encounter (Signed)
See Elaine Kennedy's note, needs in office or video visit to get insurance to approve

## 2019-08-30 NOTE — Telephone Encounter (Signed)
Order needs to be placed during an in office or video visit. HH can not do from a telephone visit

## 2019-08-30 NOTE — Telephone Encounter (Addendum)
In office visit scheduled for patient on Monday 09/04/2019.

## 2019-09-01 ENCOUNTER — Other Ambulatory Visit: Payer: Self-pay

## 2019-09-03 DIAGNOSIS — T1490XA Injury, unspecified, initial encounter: Secondary | ICD-10-CM | POA: Diagnosis not present

## 2019-09-03 DIAGNOSIS — W19XXXA Unspecified fall, initial encounter: Secondary | ICD-10-CM | POA: Diagnosis not present

## 2019-09-04 ENCOUNTER — Other Ambulatory Visit: Payer: Self-pay

## 2019-09-04 ENCOUNTER — Ambulatory Visit (INDEPENDENT_AMBULATORY_CARE_PROVIDER_SITE_OTHER): Payer: Medicare Other | Admitting: Physician Assistant

## 2019-09-04 ENCOUNTER — Encounter: Payer: Self-pay | Admitting: Physician Assistant

## 2019-09-04 VITALS — BP 128/75 | HR 100 | Temp 98.2°F | Resp 20

## 2019-09-04 DIAGNOSIS — I872 Venous insufficiency (chronic) (peripheral): Secondary | ICD-10-CM

## 2019-09-04 DIAGNOSIS — K219 Gastro-esophageal reflux disease without esophagitis: Secondary | ICD-10-CM | POA: Diagnosis not present

## 2019-09-04 DIAGNOSIS — L989 Disorder of the skin and subcutaneous tissue, unspecified: Secondary | ICD-10-CM | POA: Diagnosis not present

## 2019-09-04 DIAGNOSIS — R413 Other amnesia: Secondary | ICD-10-CM

## 2019-09-04 DIAGNOSIS — Z9181 History of falling: Secondary | ICD-10-CM

## 2019-09-04 DIAGNOSIS — M6281 Muscle weakness (generalized): Secondary | ICD-10-CM

## 2019-09-04 NOTE — Progress Notes (Signed)
Acute Office Visit  Subjective:    Patient ID: Elaine Kennedy, female    DOB: Sep 05, 1926, 86 y.o.   MRN: [id number]  Chief Complaint  Patient presents with  . face to face for home health    Extremity Weakness  This is a chronic problem. The current episode started more than 1 year ago. There has been no history of extremity trauma. The problem occurs constantly. The problem has been gradually worsening. Pertinent negatives include no fever.   Patient has multiple medical conditions that have caused her to be debilitated.  She frequently will have lymphedema in her lower legs and has had skin tears and ulcers that are difficult to heal.  She has had wound care through home health in the past.  She also has arthritis in multiple joints.  She is a high risk for falls and is getting weaker and weaker.  Yesterday she had 2 falls.  One was at 3:30 in the morning when she fell out of her bed.  The second 1 is where she got tangled table in the kitchen.  We have recommended that she not get to the kitchen but only be fed by a TV tray either admitted or in her lift chair.  We are also put a referral in for chronic care management through our office here just to see if there is any support material in the community for her and for the family.  They were asking about Rcats also.  She is definitely of high risk and   Past Medical History:  Diagnosis Date  . Arthritis   . Cancer ([place])    skin cancer on head.  [name] Dysrhythmia    AFib  . GERD (gastroesophageal reflux disease)   . Hypothyroidism     Past Surgical History:  Procedure Laterality Date  . CATARACT EXTRACTION W/PHACO Left 06/28/2017   Procedure: CATARACT EXTRACTION PHACO AND INTRAOCULAR LENS PLACEMENT (IOC);  Surgeon: Tonny Branch, MD;  Location: [facility];  Service: Ophthalmology;  Laterality: Left;  CDE: 8.46  . CATARACT EXTRACTION W/PHACO Right 02/27/2019   Procedure: CATARACT EXTRACTION PHACO AND INTRAOCULAR LENS PLACEMENT (IOC);   Surgeon: Baruch Goldmann, MD;  Location: [facility];  Service: Ophthalmology;  Laterality: Right;  CDE: 10.07  . TOTAL HIP ARTHROPLASTY Left     Family History  Problem Relation Age of Onset  . Cancer Daughter        breast  . Heart murmur Daughter     Social History   Socioeconomic History  . Marital status: Widowed    Spouse name: Not on file  . Number of children: Not on file  . Years of education: Not on file  . Highest education level: Not on file  Occupational History  . Not on file  Tobacco Use  . Smoking status: Never Smoker  . Smokeless tobacco: Never Used  Substance and Sexual Activity  . Alcohol use: No  . Drug use: No  . Sexual activity: Never    Birth control/protection: Post-menopausal  Other Topics Concern  . Not on file  Social History Narrative  . Not on file   Social Determinants of Health   Financial Resource Strain:   . Difficulty of Paying Living Expenses: Not on file  Food Insecurity:   . Worried About Charity fundraiser in the Last Year: Not on file  . Ran Out of Food in the Last Year: Not on file  Transportation Needs:   . Lack of Transportation (Medical):  Not on file  . Lack of Transportation (Non-Medical): Not on file  Physical Activity:   . Days of Exercise per Week: Not on file  . Minutes of Exercise per Session: Not on file  Stress:   . Feeling of Stress : Not on file  Social Connections:   . Frequency of Communication with Friends and Family: Not on file  . Frequency of Social Gatherings with Friends and Family: Not on file  . Attends Religious Services: Not on file  . Active Member of Clubs or Organizations: Not on file  . Attends Archivist Meetings: Not on file  . Marital Status: Not on file  Intimate Partner Violence:   . Fear of Current or Ex-Partner: Not on file  . Emotionally Abused: Not on file  . Physically Abused: Not on file  . Sexually Abused: Not on file    Outpatient Medications Prior to Visit    Medication Sig Dispense Refill  . acetaminophen (TYLENOL) 650 MG CR tablet Take 650 mg by mouth every 8 (eight) hours as needed for pain.     [name] aspirin EC 81 MG tablet Take 81 mg by mouth daily.    . Baclofen 5 MG TABS Take 5 mg by mouth 3 (three) times daily as needed. 30 tablet 0  . cholecalciferol (VITAMIN D) 1000 units tablet Take 1,000 Units by mouth daily.    . ferrous sulfate 325 (65 FE) MG tablet Take 325 mg by mouth daily with breakfast.    . hydrochlorothiazide (HYDRODIURIL) 12.5 MG tablet TAKE 1 TABLET (12.5 MG TOTAL) BY MOUTH DAILY AS NEEDED (SWELLING). 30 tablet 0  . levothyroxine (SYNTHROID) 88 MCG tablet TAKE ONE TABLET (88 MCG TOTAL) BY MOUTH DAILY. 90 tablet 0  . metoprolol succinate (TOPROL-XL) 50 MG 24 hr tablet TAKE 1 TABLET BY MOUTH EVERY DAY 90 tablet 0  . Multiple Vitamins-Minerals (MULTIVITAMIN WITH MINERALS) tablet Take 1 tablet by mouth daily. Spectra-vite    . omeprazole (PRILOSEC) 40 MG capsule Take 1 capsule (40 mg total) by mouth daily. 90 capsule 1  . Polyethylene Glycol 400 (BLINK TEARS OP) Place 1 drop into both eyes 3 (three) times daily as needed (dry eyes).    [name] diclofenac sodium (VOLTAREN) 1 % GEL Apply 4 g topically 4 (four) times daily. FOR KNEE arthritis 200 g 11  . megestrol (MEGACE) 20 MG tablet Take 1 tablet (20 mg total) by mouth daily. For appetite stimulation 30 tablet 2   No facility-administered medications prior to visit.    Allergies  Allergen Reactions  . Sulfa Antibiotics Diarrhea    Review of Systems  Constitutional: Positive for fatigue. Negative for activity change, fever and unexpected weight change.  HENT: Negative.   Eyes: Negative.   Respiratory: Negative.  Negative for cough.   Cardiovascular: Positive for leg swelling. Negative for chest pain.  Gastrointestinal: Negative.  Negative for abdominal pain.  Endocrine: Negative.   Genitourinary: Negative.  Negative for dysuria.  Musculoskeletal: Positive for arthralgias,  extremity weakness and gait problem.  Skin: Positive for wound.  Neurological: Positive for weakness.  Psychiatric/Behavioral: Positive for agitation. The patient is not nervous/anxious and is not hyperactive.        Objective:    Physical Exam Constitutional:      General: She is not in acute distress.    Appearance: Normal appearance. She is well-developed.  HENT:     Head: Normocephalic and atraumatic.  Cardiovascular:     Rate and Rhythm: Normal rate.  Pulmonary:     Effort: Pulmonary effort is normal.  Skin:    General: Skin is warm and dry.     Findings: No rash.     Comments: Bilateral lower legs are covered with bandages, minimal edema in the ankles to feet.  Neurological:     Mental Status: She is lethargic.     Motor: Weakness and tremor present.     Deep Tendon Reflexes: Reflexes are normal and symmetric.     BP 128/75   Pulse 100   Temp 98.2 F (36.8 C) (Temporal)   Resp 20   SpO2 97%  Wt Readings from Last 3 Encounters:  07/26/19 138 lb (62.6 kg)  06/19/19 142 lb (64.4 kg)  05/16/19 146 lb 6.4 oz (66.4 kg)    Health Maintenance Due  Topic Date Due  . URINE MICROALBUMIN  11/20/1938  . DEXA SCAN  11/19/1993    There are no preventive care reminders to display for this patient.   Lab Results  Component Value Date   TSH 2.520 04/03/2019   Lab Results  Component Value Date   WBC 6.8 06/19/2019   HGB 11.7 06/19/2019   HCT 35.0 06/19/2019   MCV 85 06/19/2019   PLT 259 06/19/2019   Lab Results  Component Value Date   NA 140 06/19/2019   K 4.2 06/19/2019   CO2 19 (L) 06/19/2019   GLUCOSE 105 (H) 06/19/2019   BUN 16 06/19/2019   CREATININE 1.00 06/19/2019   BILITOT 0.3 06/19/2019   ALKPHOS 95 06/19/2019   AST 20 06/19/2019   ALT 17 06/19/2019   PROT 6.5 06/19/2019   ALBUMIN 4.1 06/19/2019   CALCIUM 8.9 06/19/2019   ANIONGAP 10 06/23/2017   Lab Results  Component Value Date   CHOL 168 04/03/2019   Lab Results  Component Value Date    HDL 55 04/03/2019   Lab Results  Component Value Date   LDLCALC 101 (H) 04/03/2019   Lab Results  Component Value Date   TRIG 62 04/03/2019   Lab Results  Component Value Date   CHOLHDL 3.1 04/03/2019   Lab Results  Component Value Date   HGBA1C 5.9 06/16/2019       Assessment & Plan:   Problem List Items Addressed This Visit      Digestive   Gastroesophageal reflux disease   Relevant Orders   Ambulatory referral to [place]   Referral to Chronic Care Management Services     Musculoskeletal and Integument   Chronic stasis dermatitis   Relevant Orders   Ambulatory referral to [place]   Referral to Chronic Care Management Services   Non-healing skin lesion   Relevant Orders   Ambulatory referral to [place]   Referral to Chronic Care Management Services     Other   At high risk for falls - Primary   Relevant Orders   Ambulatory referral to [place]   Referral to Chronic Care Management Services   Muscle weakness   Relevant Orders   Ambulatory referral to [place]   Referral to Chronic Care Management Services    Other Visit Diagnoses    Memory loss           No orders of the defined types were placed in this encounter.    Terald Sleeper, PA-C

## 2019-09-05 ENCOUNTER — Telehealth: Payer: Self-pay | Admitting: Physician Assistant

## 2019-09-05 NOTE — Telephone Encounter (Signed)
REFERRAL REQUEST Telephone Note  What type of referral do you need? Dr Case in [place]--hip surgeon  Have you been seen at our office for this problem? Yes, pt was here yesterday (Advise that they will likely need an appointment with their PCP before a referral can be done)  Is there a particular doctor or location that you prefer? Dr Case in [place]  Patient notified that referrals can take up to a week or longer to process. If they haven't heard anything within a week they should call back and speak with the referral department.

## 2019-09-06 ENCOUNTER — Other Ambulatory Visit: Payer: Self-pay | Admitting: Physician Assistant

## 2019-09-06 DIAGNOSIS — M25559 Pain in unspecified hip: Secondary | ICD-10-CM

## 2019-09-06 DIAGNOSIS — Z9181 History of falling: Secondary | ICD-10-CM

## 2019-09-06 NOTE — Telephone Encounter (Signed)
Order placed

## 2019-09-06 NOTE — Telephone Encounter (Signed)
Pt aware.

## 2019-09-08 ENCOUNTER — Ambulatory Visit (INDEPENDENT_AMBULATORY_CARE_PROVIDER_SITE_OTHER): Payer: Medicare Other | Admitting: Licensed Clinical Social Worker

## 2019-09-08 DIAGNOSIS — K219 Gastro-esophageal reflux disease without esophagitis: Secondary | ICD-10-CM

## 2019-09-08 DIAGNOSIS — M159 Polyosteoarthritis, unspecified: Secondary | ICD-10-CM

## 2019-09-08 DIAGNOSIS — I10 Essential (primary) hypertension: Secondary | ICD-10-CM | POA: Diagnosis not present

## 2019-09-08 DIAGNOSIS — M8949 Other hypertrophic osteoarthropathy, multiple sites: Secondary | ICD-10-CM

## 2019-09-08 DIAGNOSIS — Z8679 Personal history of other diseases of the circulatory system: Secondary | ICD-10-CM

## 2019-09-08 NOTE — Chronic Care Management (AMB) (Signed)
Care Management Note   Elaine Kennedy is a 86 y.o. year old female who is a primary care patient of Terald Sleeper, PA-C. The CM team was consulted for assistance with chronic disease management and care coordination.   I reached out to [place]/and grandaughter [name],  by phone today.   Ms. Modesitt [name] Romona Curls , granddaughter were given information about Chronic Care Management services today including:  1. CCM service includes personalized support from designated clinical staff supervised by her physician, including individualized plan of care and coordination with other care providers 2. 24/7 contact phone numbers for assistance for urgent and routine care needs. 3. Service will only be billed when office clinical staff spend 20 minutes or more in a month to coordinate care. 4. Only one practitioner may furnish and bill the service in a calendar month. 5. The patient may stop CCM services at any time (effective at the end of the month) by phone call to the office staff. 6. The patient will be responsible for cost sharing (co-pay) of up to 20% of the service fee (after annual deductible is met). Patient [name] Romona Curls, granddaughter agreed to services and verbal consent obtained.    Review of patient status, including review of consultants reports, relevant laboratory and other test results, and collaboration with appropriate care team members and the patient's provider was performed as part of comprehensive patient evaluation and provision of chronic care management services.   Social determinants of health: risk of tobacco use; risk of depression    Chronic Care Management from 09/08/2019 in [place]  PHQ-9 Total Score  7     GAD 7 : Generalized Anxiety Score 09/08/2019  Nervous, Anxious, on Edge 1  Control/stop worrying 1  Worry too much - different things 1  Trouble relaxing 1  Restless 0  Easily annoyed or irritable 0  Afraid - awful  might happen 1  Total GAD 7 Score 5  Anxiety Difficulty Somewhat difficult   Medications   (very important)  New medications from outside sources are available for reconciliation  acetaminophen (TYLENOL) 650 MG CR tablet aspirin EC 81 MG tablet Baclofen 5 MG TABS cholecalciferol (VITAMIN D) 1000 units tablet ferrous sulfate 325 (65 FE) MG tablet hydrochlorothiazide (HYDRODIURIL) 12.5 MG tablet levothyroxine (SYNTHROID) 88 MCG tablet metoprolol succinate (TOPROL-XL) 50 MG 24 hr tablet Multiple Vitamins-Minerals (MULTIVITAMIN WITH MINERALS) tablet omeprazole (PRILOSEC) 40 MG capsule Polyethylene Glycol 400 (BLINK TEARS OP)  Goals Addressed            This Visit's Progress   . Client wants to talk with LCSW about her anxiety/stress related to fall potential (pt-stated)       Current Barriers:  . At risk for falls . Decreased appetite . Pain issues faced in client with Chronic Diagnoses of  HTN, GERD, OA, and Atrial Fibrillation . Tremor in her hands  Clinical Social Work Clinical Goal(s):  [name] LCSW will talk with clinet and with [name] in next 4 week to discuss client anxiety or stress related to fall potential  Interventions: . Talked with [name], granddaughter of client, about CCM program services . Encouraged [name] or client to call RNCM to discuss nurisng support for client . Talked with [name] abut upcoming client appointments . Talked with [name] about pain issues of client . Talked with [name] about decreased appetite of client . Talked with [name] about ambulation issues of client (client uses a walker as needed)  Patient Self Care  Activities:  . Attends all scheduled provider appointments  Patient Self Care Deficits . Unable to self administer medications as prescribed . Needs help with ADLs . Need transport help  Initial Care Plan documentation      Follow Up Plan: LCSW to call client or granddaughter of client in next 4 weeks to talk about anxiety/stress issues of  client related to fall potential  [name].Symphani Eckstrom MSW, LCSW Licensed Clinical Social Worker [facility] Care Management [phone]

## 2019-09-08 NOTE — Patient Instructions (Addendum)
Licensed Clinical Social Worker Visit Information  Goals we discussed today:  Goals Addressed            This Visit's Progress   . Client wants to talk with LCSW about her anxiety/stress related to fall potential (pt-stated)       Current Barriers:  . At risk for falls . Decreased appetite . Pain issues faced in client with Chronic Diagnoses of  HTN, GERD, OA, and Atrial Fibrillation . Tremor in her hands  Clinical Social Work Clinical Goal(s):  [name] LCSW will talk with clinet and with Elaine Kennedy in next 4 week to discuss client anxiety or stress related to fall potential  Interventions: . Talked with Elaine Kennedy, granddaughter of client,  about CCM program services . Encouraged Elaine Kennedy or client to call RNCM to discuss nurisng support for client . Talked with Elaine Kennedy abut upcoming client appointments . Talked with Elaine Kennedy about pain issues of client . Talked with Elaine Kennedy about decreased appetite of client . Talked with Elaine Kennedy about ambulation issues of client (client uses a walker as needed)  Patient Self Care Activities:  . Attends all scheduled provider appointments  Patient Self Care Deficits . Unable to self administer medications as prescribed . Needs help with ADLs . Need transport help  Initial Care Plan documentation       Materials Provided: No  Follow Up Plan: LCSW to call client or granddaughter of client in next 4 weeks to talk about  anxiety/stress issues related to fall potential for client  The patient /Elaine Kennedy, granddaughter, verbalized understanding of instructions provided today and declined a print copy of patient instruction materials.   Elaine Kennedy.Elaine Kennedy MSW, LCSW Licensed Clinical Social Worker [facility] Care Management [phone]

## 2019-09-09 DIAGNOSIS — Z85828 Personal history of other malignant neoplasm of skin: Secondary | ICD-10-CM | POA: Diagnosis not present

## 2019-09-09 DIAGNOSIS — I4891 Unspecified atrial fibrillation: Secondary | ICD-10-CM | POA: Diagnosis not present

## 2019-09-09 DIAGNOSIS — Z9181 History of falling: Secondary | ICD-10-CM | POA: Diagnosis not present

## 2019-09-09 DIAGNOSIS — M1991 Primary osteoarthritis, unspecified site: Secondary | ICD-10-CM | POA: Diagnosis not present

## 2019-09-09 DIAGNOSIS — S81811D Laceration without foreign body, right lower leg, subsequent encounter: Secondary | ICD-10-CM | POA: Diagnosis not present

## 2019-09-09 DIAGNOSIS — K219 Gastro-esophageal reflux disease without esophagitis: Secondary | ICD-10-CM | POA: Diagnosis not present

## 2019-09-09 DIAGNOSIS — E039 Hypothyroidism, unspecified: Secondary | ICD-10-CM | POA: Diagnosis not present

## 2019-09-09 DIAGNOSIS — S51812A Laceration without foreign body of left forearm, initial encounter: Secondary | ICD-10-CM | POA: Diagnosis not present

## 2019-09-09 DIAGNOSIS — S81812D Laceration without foreign body, left lower leg, subsequent encounter: Secondary | ICD-10-CM | POA: Diagnosis not present

## 2019-09-09 DIAGNOSIS — I872 Venous insufficiency (chronic) (peripheral): Secondary | ICD-10-CM | POA: Diagnosis not present

## 2019-09-11 DIAGNOSIS — K219 Gastro-esophageal reflux disease without esophagitis: Secondary | ICD-10-CM | POA: Diagnosis not present

## 2019-09-11 DIAGNOSIS — S81812D Laceration without foreign body, left lower leg, subsequent encounter: Secondary | ICD-10-CM | POA: Diagnosis not present

## 2019-09-11 DIAGNOSIS — E039 Hypothyroidism, unspecified: Secondary | ICD-10-CM | POA: Diagnosis not present

## 2019-09-11 DIAGNOSIS — Z9181 History of falling: Secondary | ICD-10-CM | POA: Diagnosis not present

## 2019-09-11 DIAGNOSIS — I4891 Unspecified atrial fibrillation: Secondary | ICD-10-CM | POA: Diagnosis not present

## 2019-09-11 DIAGNOSIS — M1991 Primary osteoarthritis, unspecified site: Secondary | ICD-10-CM | POA: Diagnosis not present

## 2019-09-11 DIAGNOSIS — S51812A Laceration without foreign body of left forearm, initial encounter: Secondary | ICD-10-CM | POA: Diagnosis not present

## 2019-09-11 DIAGNOSIS — I872 Venous insufficiency (chronic) (peripheral): Secondary | ICD-10-CM | POA: Diagnosis not present

## 2019-09-11 DIAGNOSIS — Z85828 Personal history of other malignant neoplasm of skin: Secondary | ICD-10-CM | POA: Diagnosis not present

## 2019-09-11 DIAGNOSIS — S81811D Laceration without foreign body, right lower leg, subsequent encounter: Secondary | ICD-10-CM | POA: Diagnosis not present

## 2019-09-17 ENCOUNTER — Other Ambulatory Visit: Payer: Self-pay | Admitting: Physician Assistant

## 2019-09-17 DIAGNOSIS — R600 Localized edema: Secondary | ICD-10-CM

## 2019-09-18 ENCOUNTER — Other Ambulatory Visit: Payer: Self-pay

## 2019-09-18 ENCOUNTER — Ambulatory Visit (INDEPENDENT_AMBULATORY_CARE_PROVIDER_SITE_OTHER): Payer: Medicare Other | Admitting: *Deleted

## 2019-09-18 DIAGNOSIS — E039 Hypothyroidism, unspecified: Secondary | ICD-10-CM

## 2019-09-18 DIAGNOSIS — M8949 Other hypertrophic osteoarthropathy, multiple sites: Secondary | ICD-10-CM | POA: Diagnosis not present

## 2019-09-18 DIAGNOSIS — Z9181 History of falling: Secondary | ICD-10-CM | POA: Diagnosis not present

## 2019-09-18 DIAGNOSIS — M159 Polyosteoarthritis, unspecified: Secondary | ICD-10-CM

## 2019-09-18 DIAGNOSIS — I10 Essential (primary) hypertension: Secondary | ICD-10-CM | POA: Diagnosis not present

## 2019-09-18 DIAGNOSIS — M6281 Muscle weakness (generalized): Secondary | ICD-10-CM | POA: Diagnosis not present

## 2019-09-18 NOTE — Patient Instructions (Signed)
Visit Information  Goals Addressed            This Visit's Progress   . Chronic Care Management Needs       Current Barriers:  . Chronic Disease Management support, education, and care coordination needs related to hypertension, hypothyroidism, arthritis  Clinical Goal(s) related to hypertension, hypothyroidism, arthritis:  Over the next 60 days, patient will:  . Work with the care management team to address educational, disease management, and care coordination needs  . Call provider office for new or worsened signs and symptoms Call care management team with questions or concerns . Verbalize basic understanding of patient centered plan of care established today  Interventions related to hypertension, hypothyroidism, arthritis:  . Evaluation of current treatment plans and patient's adherence to plan as established by provider . Assessed patient understanding of disease states . Assessed patient's education and care coordination needs . Provided disease specific education to patient  . Collaborated with appropriate clinical care team members regarding patient needs  Patient Self Care Activities related to hypertension, hypothyroidism, arthritis:  . Patient is unable to independently self-manage chronic health conditions  Initial goal documentation     . Fall Prevention       Current Barriers:  [name] Knowledge Deficits related to fall precautions . Decreased adherence to prescribed treatment for fall prevention  Nurse Case Manager Clinical Goal(s):  [name] Over the next 60 days, patient will demonstrate improved adherence to prescribed treatment plan for decreasing falls as evidenced by patient reporting and review of EMR . Over the next 30days, patient will verbalize using fall risk reduction strategies discussed . Over the next 90 days, patient will not experience additional falls  Interventions:  . Provided written and verbal education re: Potential causes of falls and Fall  prevention strategies . Reviewed medications and discussed potential side effects of medications such as dizziness and frequent urination . Assessed for s/s of orthostatic hypotension . Assessed for falls since last encounter. . Assessed patients knowledge of fall risk prevention secondary to previously provided education.  Patient Self Care Activities:  . Utilize walker (assistive device) appropriately with all ambulation . De-clutter walkways . Change positions slowly . Wear secure fitting shoes at all times with ambulation . Utilize home lighting for dim lit areas . Have self and pet awareness at all times  Plan: . CCM RN CM will follow up in 30   Initial goal documentation       The care management team will reach out to the patient again over the next 30 days.    Chong Sicilian, BSN, RN-BC Embedded Chronic Care Manager [facility] Management Direct Dial: [phone]   The patient verbalized understanding of instructions provided today and declined a print copy of patient instruction materials.

## 2019-09-18 NOTE — Chronic Care Management (AMB) (Signed)
Chronic Care Management   Initial Visit Note  09/18/2019 Name: DELITHA WOEHR MRN: [id number] DOB: 25-Mar-1927  Referred by: Terald Sleeper, PA-C Reason for referral : Chronic Care Management (RN initial outreach)   Elaine Kennedy is a 86 y.o. year old female who is a primary care patient of Terald Sleeper, PA-C. The CCM team was consulted for assistance with chronic disease management and care coordination needs related to hypertension, hypothyroidism, arthritis, high fall risk, chronic stasis dermatitis, and muscle weakness.   Review of patient status, including review of consultants reports, relevant laboratory and other test results, and collaboration with appropriate care team members and the patient's provider was performed as part of comprehensive patient evaluation and provision of chronic care management services.    SDOH (Social Determinants of Health) screening performed today: Physical Activity. See Care Plan for related entries.   Subjective: I spoke with patient's granddaughter, [name], by telephone today. She does not have any problems with access to food, medication, transportation or safe housing. She is somewhat socially isolated due to Covid pandemic. She does talk with family on the phone several times a week and she has relatives that come in daily to help her out at home. She does not go out except for medial appointments.   Objective: Outpatient Encounter Medications as of 09/18/2019  Medication Sig Note  . acetaminophen (TYLENOL) 650 MG CR tablet Take 650 mg by mouth every 8 (eight) hours as needed for pain.    [name] aspirin EC 81 MG tablet Take 81 mg by mouth daily.   . Baclofen 5 MG TABS Take 5 mg by mouth 3 (three) times daily as needed.   . cholecalciferol (VITAMIN D) 1000 units tablet Take 1,000 Units by mouth daily.   . ferrous sulfate 325 (65 FE) MG tablet Take 325 mg by mouth daily with breakfast.   . hydrochlorothiazide (HYDRODIURIL) 12.5 MG tablet TAKE 1 TABLET (12.5  MG TOTAL) BY MOUTH DAILY AS NEEDED (SWELLING).   [name] levothyroxine (SYNTHROID) 88 MCG tablet TAKE ONE TABLET (88 MCG TOTAL) BY MOUTH DAILY.   . metoprolol succinate (TOPROL-XL) 50 MG 24 hr tablet TAKE 1 TABLET BY MOUTH EVERY DAY 06/19/2019: Takes one half pill, 25 mg  . Multiple Vitamins-Minerals (MULTIVITAMIN WITH MINERALS) tablet Take 1 tablet by mouth daily. Spectra-vite   . omeprazole (PRILOSEC) 40 MG capsule Take 1 capsule (40 mg total) by mouth daily.   . Polyethylene Glycol 400 (BLINK TEARS OP) Place 1 drop into both eyes 3 (three) times daily as needed (dry eyes).    No facility-administered encounter medications on file as of 09/18/2019.     BP Readings from Last 3 Encounters:  09/04/19 128/75  07/26/19 124/68  06/19/19 (!) 147/75   Fall Risk  09/04/2019 06/19/2019  Falls in the past year? 1 1  Number falls in past yr: 1 1  Injury with Fall? 1 1  Comment Skin tears Lump on head  Risk for fall due to : - Impaired balance/gait;Mental status change  Follow up - Falls prevention discussed     RN Assessment & Care Plan    . Chronic Care Management Needs       Current Barriers:  . Chronic Disease Management support, education, and care coordination needs related to hypertension, hypothyroidism, arthritis  Clinical Goal(s) related to hypertension, hypothyroidism, arthritis:  Over the next 60 days, patient will:  . Work with the care management team to address educational, disease management, and care coordination needs  .  Call provider office for new or worsened signs and symptoms Call care management team with questions or concerns . Verbalize basic understanding of patient centered plan of care established today  Interventions related to hypertension, hypothyroidism, arthritis:  . Evaluation of current treatment plans and patient's adherence to plan as established by provider . Assessed patient understanding of disease states . Assessed patient's education and care coordination  needs . Provided disease specific education to patient  . Collaborated with appropriate clinical care team members regarding patient needs  Patient Self Care Activities related to hypertension, hypothyroidism, arthritis:  . Patient is unable to independently self-manage chronic health conditions  Initial goal documentation     . Fall Prevention       Current Barriers:  [name] Knowledge Deficits related to fall precautions . Decreased adherence to prescribed treatment for fall prevention  Nurse Case Manager Clinical Goal(s):  [name] Over the next 60 days, patient will demonstrate improved adherence to prescribed treatment plan for decreasing falls as evidenced by patient reporting and review of EMR . Over the next 30days, patient will verbalize using fall risk reduction strategies discussed . Over the next 90 days, patient will not experience additional falls  Interventions:  . Provided written and verbal education re: Potential causes of falls and Fall prevention strategies . Reviewed medications and discussed potential side effects of medications such as dizziness and frequent urination . Assessed for s/s of orthostatic hypotension . Assessed for falls since last encounter. . Assessed patients knowledge of fall risk prevention secondary to previously provided education.  Patient Self Care Activities:  . Utilize walker (assistive device) appropriately with all ambulation . De-clutter walkways . Change positions slowly . Wear secure fitting shoes at all times with ambulation . Utilize home lighting for dim lit areas . Have self and pet awareness at all times  Plan: . CCM RN CM will follow up in 30   Initial goal documentation        Follow-up Plan:   The care management team will reach out to the patient again over the next 30 days.   Chong Sicilian, BSN, RN-BC Embedded Chronic Care Manager [facility] Direct Dial: [phone]

## 2019-09-19 ENCOUNTER — Encounter: Payer: Self-pay | Admitting: Physician Assistant

## 2019-09-19 ENCOUNTER — Ambulatory Visit (INDEPENDENT_AMBULATORY_CARE_PROVIDER_SITE_OTHER): Payer: Medicare Other | Admitting: Physician Assistant

## 2019-09-19 VITALS — BP 166/81 | HR 83 | Temp 97.3°F

## 2019-09-19 DIAGNOSIS — R399 Unspecified symptoms and signs involving the genitourinary system: Secondary | ICD-10-CM

## 2019-09-19 DIAGNOSIS — R454 Irritability and anger: Secondary | ICD-10-CM | POA: Diagnosis not present

## 2019-09-19 DIAGNOSIS — I10 Essential (primary) hypertension: Secondary | ICD-10-CM | POA: Diagnosis not present

## 2019-09-19 DIAGNOSIS — E039 Hypothyroidism, unspecified: Secondary | ICD-10-CM | POA: Diagnosis not present

## 2019-09-19 DIAGNOSIS — M6281 Muscle weakness (generalized): Secondary | ICD-10-CM

## 2019-09-19 LAB — MICROSCOPIC EXAMINATION: Renal Epithel, UA: NONE SEEN /hpf

## 2019-09-19 LAB — URINALYSIS, COMPLETE
Bilirubin, UA: NEGATIVE
Glucose, UA: NEGATIVE
Ketones, UA: NEGATIVE
Leukocytes,UA: NEGATIVE
Nitrite, UA: NEGATIVE
Protein,UA: NEGATIVE
Specific Gravity, UA: 1.015 (ref 1.005–1.030)
Urobilinogen, Ur: 0.2 mg/dL (ref 0.2–1.0)
pH, UA: 6 (ref 5.0–7.5)

## 2019-09-19 MED ORDER — SERTRALINE HCL 50 MG PO TABS: 50.0000 mg | ORAL_TABLET | Freq: Every day | ORAL | 5 refills | Status: DC

## 2019-09-19 NOTE — Patient Instructions (Addendum)
Drink behind medicine  Do walking like the physical therapist says  Debrox

## 2019-09-19 NOTE — Addendum Note (Signed)
Addended by: Liliane Bade on: 09/19/2019 04:09 PM   Modules accepted: Orders

## 2019-09-19 NOTE — Addendum Note (Signed)
Addended by: Terald Sleeper on: 09/19/2019 03:00 PM   Modules accepted: Orders

## 2019-09-19 NOTE — Progress Notes (Signed)
Acute Office Visit  Subjective:    Patient ID: Elaine Kennedy, female    DOB: 1927-03-04, 86 y.o.   MRN: [id number]  Chief Complaint  Patient presents with  . Medical Management of Chronic Issues    28m Pt's daughter states that she has become combative/ refusing meds/water    Hypertension This is a chronic problem. The current episode started more than 1 year ago. The problem is controlled. Associated symptoms include anxiety. Pertinent negatives include no chest pain. Past treatments include beta blockers and diuretics. The current treatment provides mild improvement. Identifiable causes of hypertension include a thyroid problem.  Thyroid Problem Presents for follow-up visit. Symptoms include anxiety, fatigue and tremors. The symptoms have been stable.  Extremity Weakness  The pain is present in the right hip, right upper leg, right knee, right lower leg, left hip, left upper leg, left knee and left lower leg. This is a chronic problem. The current episode started more than 1 year ago. The problem occurs constantly. The problem has been gradually worsening. The pain is at a severity of 1/10. Associated symptoms include joint locking, a limited range of motion and stiffness. Pertinent negatives include no fever or itching.  Anxiety Presents for follow-up visit. Symptoms include confusion, irritability and nervous/anxious behavior. Patient reports no chest pain. Symptoms occur most days. The severity of symptoms is interfering with daily activities. The quality of sleep is fair.       Past Medical History:  Diagnosis Date  . Arthritis   . [place].  .Marland KitchenDysrhythmia    AFib  . GERD (gastroesophageal reflux disease)   . Hypothyroidism     Past Surgical History:  Procedure Laterality Date  . CATARACT EXTRACTION W/PHACO Left 06/28/2017   Procedure: CATARACT EXTRACTION PHACO AND INTRAOCULAR LENS PLACEMENT (IOC);  Surgeon: HTonny Branch MD;  Location: [facility];   Service: Ophthalmology;  Laterality: Left;  CDE: 8.46  . CATARACT EXTRACTION W/PHACO Right 02/27/2019   Procedure: CATARACT EXTRACTION PHACO AND INTRAOCULAR LENS PLACEMENT (IOC);  Surgeon: WBaruch Goldmann MD;  Location: [facility];  Service: Ophthalmology;  Laterality: Right;  CDE: 10.07  . TOTAL HIP ARTHROPLASTY Left     Family History  Problem Relation Age of Onset  . Cancer Daughter        breast  . Heart murmur Daughter     Social History   Socioeconomic History  . Marital status: Widowed    Spouse name: Not on file  . Number of children: Not on file  . Years of education: Not on file  . Highest education level: Not on file  Occupational History  . Not on file  Tobacco Use  . Smoking status: Never Smoker  . Smokeless tobacco: Never Used  Substance and Sexual Activity  . Alcohol use: No  . Drug use: No  . Sexual activity: Never    Birth control/protection: Post-menopausal  Other Topics Concern  . Not on file  Social History Narrative  . Not on file   Social Determinants of Health   Financial Resource Strain: Low Risk   . Difficulty of Paying Living Expenses: Not very hard  Food Insecurity: No Food Insecurity  . Worried About RCharity fundraiserin the Last Year: Never true  . Ran Out of Food in the Last Year: Never true  Transportation Needs: No Transportation Needs  . Lack of Transportation (Medical): No  . Lack of Transportation (Non-Medical): No  Physical Activity:  Inactive  . Days of Exercise per Week: 0 days  . Minutes of Exercise per Session: 0 min  Stress: No Stress Concern Present  . Feeling of Stress : Not at all  Social Connections: Moderately Isolated  . Frequency of Communication with Friends and Family: Three times a week  . Frequency of Social Gatherings with Friends and Family: More than three times a week  . Attends Religious Services: Never  . Active Member of Clubs or Organizations: No  . Attends Archivist Meetings: Never  . Marital  Status: Widowed  Intimate Partner Violence: Not At Risk  . Fear of Current or Ex-Partner: No  . Emotionally Abused: No  . Physically Abused: No  . Sexually Abused: No    Outpatient Medications Prior to Visit  Medication Sig Dispense Refill  . acetaminophen (TYLENOL) 650 MG CR tablet Take 650 mg by mouth every 8 (eight) hours as needed for pain.     [name] aspirin EC 81 MG tablet Take 81 mg by mouth daily.    . Baclofen 5 MG TABS Take 5 mg by mouth 3 (three) times daily as needed. 30 tablet 0  . cholecalciferol (VITAMIN D) 1000 units tablet Take 1,000 Units by mouth daily.    . ferrous sulfate 325 (65 FE) MG tablet Take 325 mg by mouth daily with breakfast.    . hydrochlorothiazide (HYDRODIURIL) 12.5 MG tablet TAKE 1 TABLET (12.5 MG TOTAL) BY MOUTH DAILY AS NEEDED (SWELLING). 30 tablet 0  . levothyroxine (SYNTHROID) 88 MCG tablet TAKE ONE TABLET (88 MCG TOTAL) BY MOUTH DAILY. 90 tablet 0  . metoprolol succinate (TOPROL-XL) 50 MG 24 hr tablet TAKE 1 TABLET BY MOUTH EVERY DAY 90 tablet 0  . Multiple Vitamins-Minerals (MULTIVITAMIN WITH MINERALS) tablet Take 1 tablet by mouth daily. Spectra-vite    . omeprazole (PRILOSEC) 40 MG capsule Take 1 capsule (40 mg total) by mouth daily. 90 capsule 1  . Polyethylene Glycol 400 (BLINK TEARS OP) Place 1 drop into both eyes 3 (three) times daily as needed (dry eyes).     No facility-administered medications prior to visit.    Allergies  Allergen Reactions  . Sulfa Antibiotics Diarrhea    Review of Systems  Constitutional: Positive for fatigue and irritability. Negative for activity change and fever.  HENT: Negative.   Eyes: Negative.   Respiratory: Negative.  Negative for cough.   Cardiovascular: Positive for leg swelling. Negative for chest pain.  Gastrointestinal: Negative.  Negative for abdominal pain.  Endocrine: Negative.   Genitourinary: Negative.  Negative for dysuria.  Musculoskeletal: Positive for arthralgias, extremity weakness, gait  problem and stiffness.  Skin: Positive for rash. Negative for itching.  Neurological: Positive for tremors.  Psychiatric/Behavioral: Positive for confusion. The patient is nervous/anxious.        Objective:    Physical Exam Constitutional:      General: She is not in acute distress.    Appearance: Normal appearance. She is well-developed.  HENT:     Head: Normocephalic and atraumatic.  Cardiovascular:     Rate and Rhythm: Normal rate.  Pulmonary:     Effort: Pulmonary effort is normal.  Musculoskeletal:     Right lower leg: Edema present.     Left lower leg: Edema present.  Skin:    General: Skin is warm and dry.     Findings: No rash.  Neurological:     Mental Status: She is alert and oriented to person, place, and time.  Deep Tendon Reflexes: Reflexes are normal and symmetric.  Psychiatric:        Attention and Perception: Perception normal. She is inattentive. She does not perceive visual hallucinations.        Behavior: Behavior is cooperative.     BP (!) 166/81   Pulse 83   Temp (!) 97.3 F (36.3 C)  Wt Readings from Last 3 Encounters:  07/26/19 138 lb (62.6 kg)  06/19/19 142 lb (64.4 kg)  05/16/19 146 lb 6.4 oz (66.4 kg)    Health Maintenance Due  Topic Date Due  . URINE MICROALBUMIN  11/20/1938  . DEXA SCAN  11/19/1993    There are no preventive care reminders to display for this patient.   Lab Results  Component Value Date   TSH 2.520 04/03/2019   Lab Results  Component Value Date   WBC 6.8 06/19/2019   HGB 11.7 06/19/2019   HCT 35.0 06/19/2019   MCV 85 06/19/2019   PLT 259 06/19/2019   Lab Results  Component Value Date   NA 140 06/19/2019   K 4.2 06/19/2019   CO2 19 (L) 06/19/2019   GLUCOSE 105 (H) 06/19/2019   BUN 16 06/19/2019   CREATININE 1.00 06/19/2019   BILITOT 0.3 06/19/2019   ALKPHOS 95 06/19/2019   AST 20 06/19/2019   ALT 17 06/19/2019   PROT 6.5 06/19/2019   ALBUMIN 4.1 06/19/2019   CALCIUM 8.9 06/19/2019   ANIONGAP  10 06/23/2017   Lab Results  Component Value Date   CHOL 168 04/03/2019   Lab Results  Component Value Date   HDL 55 04/03/2019   Lab Results  Component Value Date   LDLCALC 101 (H) 04/03/2019   Lab Results  Component Value Date   TRIG 62 04/03/2019   Lab Results  Component Value Date   CHOLHDL 3.1 04/03/2019   Lab Results  Component Value Date   HGBA1C 5.9 06/16/2019       Assessment & Plan:   Problem List Items Addressed This Visit      Cardiovascular and Mediastinum   Essential hypertension with goal blood pressure less than 130/80 - Primary   Relevant Orders   CBC with Differential/Platelet   CMP14+EGFR     Endocrine   Hypothyroidism   Relevant Orders   TSH     Other   Muscle weakness   Anger       Meds ordered this encounter  Medications  . sertraline (ZOLOFT) 50 MG tablet    Sig: Take 1 tablet (50 mg total) by mouth daily. For nerves    Dispense:  30 tablet    Refill:  5    Order Specific Question:   Supervising Provider    Answer:   [name] [[id number]]     Terald Sleeper, PA-C   Terald Sleeper PA-C [facility] [address]  [place], [place] [address] [phone]

## 2019-09-20 ENCOUNTER — Telehealth: Payer: Self-pay | Admitting: Physician Assistant

## 2019-09-20 ENCOUNTER — Other Ambulatory Visit: Payer: Self-pay | Admitting: Physician Assistant

## 2019-09-20 DIAGNOSIS — R600 Localized edema: Secondary | ICD-10-CM

## 2019-09-20 LAB — CBC WITH DIFFERENTIAL/PLATELET
Basophils Absolute: 0.1 10*3/uL (ref 0.0–0.2)
Basos: 1 %
EOS (ABSOLUTE): 0.1 10*3/uL (ref 0.0–0.4)
Eos: 2 %
Hematocrit: 35 % (ref 34.0–46.6)
Hemoglobin: 11.2 g/dL (ref 11.1–15.9)
Immature Grans (Abs): 0 10*3/uL (ref 0.0–0.1)
Immature Granulocytes: 0 %
Lymphocytes Absolute: 1.5 10*3/uL (ref 0.7–3.1)
Lymphs: 33 %
MCH: 28.2 pg (ref 26.6–33.0)
MCHC: 32 g/dL (ref 31.5–35.7)
MCV: 88 fL (ref 79–97)
Monocytes Absolute: 0.4 10*3/uL (ref 0.1–0.9)
Monocytes: 9 %
Neutrophils Absolute: 2.6 10*3/uL (ref 1.4–7.0)
Neutrophils: 55 %
Platelets: 226 10*3/uL (ref 150–450)
RBC: 3.97 x10E6/uL (ref 3.77–5.28)
RDW: 14.3 % (ref 11.7–15.4)
WBC: 4.6 10*3/uL (ref 3.4–10.8)

## 2019-09-20 LAB — CMP14+EGFR
ALT: 10 IU/L (ref 0–32)
AST: 19 IU/L (ref 0–40)
Albumin/Globulin Ratio: 1.8 (ref 1.2–2.2)
Albumin: 4.1 g/dL (ref 3.5–4.6)
Alkaline Phosphatase: 83 IU/L (ref 39–117)
BUN/Creatinine Ratio: 18 (ref 12–28)
BUN: 15 mg/dL (ref 10–36)
Bilirubin Total: 0.3 mg/dL (ref 0.0–1.2)
CO2: 24 mmol/L (ref 20–29)
Calcium: 8.5 mg/dL — ABNORMAL LOW (ref 8.7–10.3)
Chloride: 102 mmol/L (ref 96–106)
Creatinine, Ser: 0.83 mg/dL (ref 0.57–1.00)
GFR calc Af Amer: 72 mL/min/{1.73_m2} (ref >59)
GFR calc non Af Amer: 62 mL/min/{1.73_m2} (ref >59)
Globulin, Total: 2.3 g/dL (ref 1.5–4.5)
Glucose: 93 mg/dL (ref 65–99)
Potassium: 3.4 mmol/L — ABNORMAL LOW (ref 3.5–5.2)
Sodium: 141 mmol/L (ref 134–144)
Total Protein: 6.4 g/dL (ref 6.0–8.5)

## 2019-09-20 LAB — MICROALBUMIN / CREATININE URINE RATIO
Creatinine, Urine: 51.1 mg/dL
Microalb/Creat Ratio: 13 mg/g creat (ref 0–29)
Microalbumin, Urine: 6.5 ug/mL

## 2019-09-20 LAB — TSH: TSH: 2.2 u[IU]/mL (ref 0.450–4.500)

## 2019-09-20 NOTE — Telephone Encounter (Signed)
Daughter wanted to know if the new med sertraline interfered with tylenol. No it doesn/t, pt aware

## 2019-09-21 ENCOUNTER — Telehealth: Payer: Self-pay | Admitting: Physician Assistant

## 2019-09-21 LAB — URINE CULTURE

## 2019-09-21 NOTE — Telephone Encounter (Signed)
Okay to try for 10 days, and if not improving, plan to return to normal dose.

## 2019-09-21 NOTE — Telephone Encounter (Signed)
Patients daughter states she is feeling down and week. Has only taking one pill today advised daughter it takes time to get in her system. She slo think the 50 mg is to high can she cut in half? Patient daughter aware Glenard Haring to advise tomorrow

## 2019-09-21 NOTE — Telephone Encounter (Signed)
Patient aware and verbalized understanding. °

## 2019-09-23 DIAGNOSIS — S81812D Laceration without foreign body, left lower leg, subsequent encounter: Secondary | ICD-10-CM | POA: Diagnosis not present

## 2019-09-23 DIAGNOSIS — K219 Gastro-esophageal reflux disease without esophagitis: Secondary | ICD-10-CM | POA: Diagnosis not present

## 2019-09-23 DIAGNOSIS — E039 Hypothyroidism, unspecified: Secondary | ICD-10-CM | POA: Diagnosis not present

## 2019-09-23 DIAGNOSIS — S51812A Laceration without foreign body of left forearm, initial encounter: Secondary | ICD-10-CM | POA: Diagnosis not present

## 2019-09-23 DIAGNOSIS — I4891 Unspecified atrial fibrillation: Secondary | ICD-10-CM | POA: Diagnosis not present

## 2019-09-23 DIAGNOSIS — Z85828 Personal history of other malignant neoplasm of skin: Secondary | ICD-10-CM | POA: Diagnosis not present

## 2019-09-23 DIAGNOSIS — I872 Venous insufficiency (chronic) (peripheral): Secondary | ICD-10-CM | POA: Diagnosis not present

## 2019-09-23 DIAGNOSIS — Z9181 History of falling: Secondary | ICD-10-CM | POA: Diagnosis not present

## 2019-09-23 DIAGNOSIS — M1991 Primary osteoarthritis, unspecified site: Secondary | ICD-10-CM | POA: Diagnosis not present

## 2019-09-23 DIAGNOSIS — S81811D Laceration without foreign body, right lower leg, subsequent encounter: Secondary | ICD-10-CM | POA: Diagnosis not present

## 2019-09-25 DIAGNOSIS — M25551 Pain in right hip: Secondary | ICD-10-CM | POA: Diagnosis not present

## 2019-09-25 DIAGNOSIS — R296 Repeated falls: Secondary | ICD-10-CM | POA: Insufficient documentation

## 2019-09-25 DIAGNOSIS — M25562 Pain in left knee: Secondary | ICD-10-CM | POA: Diagnosis not present

## 2019-09-25 DIAGNOSIS — Z7409 Other reduced mobility: Secondary | ICD-10-CM | POA: Diagnosis not present

## 2019-09-25 DIAGNOSIS — M25561 Pain in right knee: Secondary | ICD-10-CM | POA: Diagnosis not present

## 2019-09-26 ENCOUNTER — Telehealth: Payer: Self-pay | Admitting: Physician Assistant

## 2019-09-26 DIAGNOSIS — S81812D Laceration without foreign body, left lower leg, subsequent encounter: Secondary | ICD-10-CM | POA: Diagnosis not present

## 2019-09-26 DIAGNOSIS — M1991 Primary osteoarthritis, unspecified site: Secondary | ICD-10-CM | POA: Diagnosis not present

## 2019-09-26 DIAGNOSIS — S81811D Laceration without foreign body, right lower leg, subsequent encounter: Secondary | ICD-10-CM | POA: Diagnosis not present

## 2019-09-26 DIAGNOSIS — S51812A Laceration without foreign body of left forearm, initial encounter: Secondary | ICD-10-CM | POA: Diagnosis not present

## 2019-09-26 DIAGNOSIS — I4891 Unspecified atrial fibrillation: Secondary | ICD-10-CM | POA: Diagnosis not present

## 2019-09-26 DIAGNOSIS — Z85828 Personal history of other malignant neoplasm of skin: Secondary | ICD-10-CM | POA: Diagnosis not present

## 2019-09-26 DIAGNOSIS — E039 Hypothyroidism, unspecified: Secondary | ICD-10-CM | POA: Diagnosis not present

## 2019-09-26 DIAGNOSIS — I872 Venous insufficiency (chronic) (peripheral): Secondary | ICD-10-CM | POA: Diagnosis not present

## 2019-09-26 DIAGNOSIS — K219 Gastro-esophageal reflux disease without esophagitis: Secondary | ICD-10-CM | POA: Diagnosis not present

## 2019-09-26 DIAGNOSIS — Z9181 History of falling: Secondary | ICD-10-CM | POA: Diagnosis not present

## 2019-09-26 NOTE — Telephone Encounter (Signed)
Pt's daughter d/c Sertraline. States that pt was sleeping more, legs and arms abnormal movements, diarrhea, weak. Daughter thinks that pts anxiety is better and does not want to start on another medication.

## 2019-09-27 DIAGNOSIS — K219 Gastro-esophageal reflux disease without esophagitis: Secondary | ICD-10-CM | POA: Diagnosis not present

## 2019-09-27 DIAGNOSIS — Z9181 History of falling: Secondary | ICD-10-CM | POA: Diagnosis not present

## 2019-09-27 DIAGNOSIS — S51812A Laceration without foreign body of left forearm, initial encounter: Secondary | ICD-10-CM | POA: Diagnosis not present

## 2019-09-27 DIAGNOSIS — E039 Hypothyroidism, unspecified: Secondary | ICD-10-CM | POA: Diagnosis not present

## 2019-09-27 DIAGNOSIS — I872 Venous insufficiency (chronic) (peripheral): Secondary | ICD-10-CM | POA: Diagnosis not present

## 2019-09-27 DIAGNOSIS — I4891 Unspecified atrial fibrillation: Secondary | ICD-10-CM | POA: Diagnosis not present

## 2019-09-27 DIAGNOSIS — S81812D Laceration without foreign body, left lower leg, subsequent encounter: Secondary | ICD-10-CM | POA: Diagnosis not present

## 2019-09-27 DIAGNOSIS — S81811D Laceration without foreign body, right lower leg, subsequent encounter: Secondary | ICD-10-CM | POA: Diagnosis not present

## 2019-09-27 DIAGNOSIS — M1991 Primary osteoarthritis, unspecified site: Secondary | ICD-10-CM | POA: Diagnosis not present

## 2019-09-27 DIAGNOSIS — Z85828 Personal history of other malignant neoplasm of skin: Secondary | ICD-10-CM | POA: Diagnosis not present

## 2019-09-30 ENCOUNTER — Other Ambulatory Visit: Payer: Self-pay | Admitting: Physician Assistant

## 2019-09-30 DIAGNOSIS — R630 Anorexia: Secondary | ICD-10-CM

## 2019-10-02 ENCOUNTER — Telehealth: Payer: Self-pay | Admitting: Physician Assistant

## 2019-10-02 ENCOUNTER — Other Ambulatory Visit: Payer: Self-pay | Admitting: Physician Assistant

## 2019-10-02 DIAGNOSIS — M25551 Pain in right hip: Secondary | ICD-10-CM | POA: Diagnosis not present

## 2019-10-02 DIAGNOSIS — Z96642 Presence of left artificial hip joint: Secondary | ICD-10-CM | POA: Diagnosis not present

## 2019-10-02 DIAGNOSIS — M1711 Unilateral primary osteoarthritis, right knee: Secondary | ICD-10-CM | POA: Diagnosis not present

## 2019-10-02 DIAGNOSIS — M25561 Pain in right knee: Secondary | ICD-10-CM | POA: Diagnosis not present

## 2019-10-02 DIAGNOSIS — M1611 Unilateral primary osteoarthritis, right hip: Secondary | ICD-10-CM | POA: Diagnosis not present

## 2019-10-02 DIAGNOSIS — M25552 Pain in left hip: Secondary | ICD-10-CM | POA: Diagnosis not present

## 2019-10-02 DIAGNOSIS — M1712 Unilateral primary osteoarthritis, left knee: Secondary | ICD-10-CM | POA: Diagnosis not present

## 2019-10-02 DIAGNOSIS — M25562 Pain in left knee: Secondary | ICD-10-CM | POA: Diagnosis not present

## 2019-10-02 DIAGNOSIS — I739 Peripheral vascular disease, unspecified: Secondary | ICD-10-CM | POA: Diagnosis not present

## 2019-10-02 NOTE — Telephone Encounter (Signed)
Do you know anything about this?  The Metoprolol was on her med list at her visit with you on 09/19/19.

## 2019-10-02 NOTE — Telephone Encounter (Signed)
The medication was not stopped. Please check with pharmacy.  It could be that the insurance is not covering metoprolol succinate this year and want metop. Tartrate?

## 2019-10-03 DIAGNOSIS — S51812A Laceration without foreign body of left forearm, initial encounter: Secondary | ICD-10-CM | POA: Diagnosis not present

## 2019-10-03 DIAGNOSIS — K219 Gastro-esophageal reflux disease without esophagitis: Secondary | ICD-10-CM | POA: Diagnosis not present

## 2019-10-03 DIAGNOSIS — S81811D Laceration without foreign body, right lower leg, subsequent encounter: Secondary | ICD-10-CM | POA: Diagnosis not present

## 2019-10-03 DIAGNOSIS — Z9181 History of falling: Secondary | ICD-10-CM | POA: Diagnosis not present

## 2019-10-03 DIAGNOSIS — M1991 Primary osteoarthritis, unspecified site: Secondary | ICD-10-CM | POA: Diagnosis not present

## 2019-10-03 DIAGNOSIS — I872 Venous insufficiency (chronic) (peripheral): Secondary | ICD-10-CM | POA: Diagnosis not present

## 2019-10-03 DIAGNOSIS — E039 Hypothyroidism, unspecified: Secondary | ICD-10-CM | POA: Diagnosis not present

## 2019-10-03 DIAGNOSIS — I4891 Unspecified atrial fibrillation: Secondary | ICD-10-CM | POA: Diagnosis not present

## 2019-10-03 DIAGNOSIS — Z85828 Personal history of other malignant neoplasm of skin: Secondary | ICD-10-CM | POA: Diagnosis not present

## 2019-10-03 DIAGNOSIS — S81812D Laceration without foreign body, left lower leg, subsequent encounter: Secondary | ICD-10-CM | POA: Diagnosis not present

## 2019-10-04 ENCOUNTER — Ambulatory Visit: Payer: Medicare Other | Admitting: Physician Assistant

## 2019-10-05 DIAGNOSIS — M1991 Primary osteoarthritis, unspecified site: Secondary | ICD-10-CM | POA: Diagnosis not present

## 2019-10-05 DIAGNOSIS — Z85828 Personal history of other malignant neoplasm of skin: Secondary | ICD-10-CM | POA: Diagnosis not present

## 2019-10-05 DIAGNOSIS — S81812D Laceration without foreign body, left lower leg, subsequent encounter: Secondary | ICD-10-CM | POA: Diagnosis not present

## 2019-10-05 DIAGNOSIS — I872 Venous insufficiency (chronic) (peripheral): Secondary | ICD-10-CM | POA: Diagnosis not present

## 2019-10-05 DIAGNOSIS — E039 Hypothyroidism, unspecified: Secondary | ICD-10-CM | POA: Diagnosis not present

## 2019-10-05 DIAGNOSIS — I10 Essential (primary) hypertension: Secondary | ICD-10-CM | POA: Diagnosis not present

## 2019-10-05 DIAGNOSIS — I4891 Unspecified atrial fibrillation: Secondary | ICD-10-CM | POA: Diagnosis not present

## 2019-10-05 DIAGNOSIS — S51812A Laceration without foreign body of left forearm, initial encounter: Secondary | ICD-10-CM | POA: Diagnosis not present

## 2019-10-05 DIAGNOSIS — Z9181 History of falling: Secondary | ICD-10-CM | POA: Diagnosis not present

## 2019-10-05 DIAGNOSIS — S81811D Laceration without foreign body, right lower leg, subsequent encounter: Secondary | ICD-10-CM | POA: Diagnosis not present

## 2019-10-05 DIAGNOSIS — K219 Gastro-esophageal reflux disease without esophagitis: Secondary | ICD-10-CM | POA: Diagnosis not present

## 2019-10-06 ENCOUNTER — Ambulatory Visit: Payer: Self-pay | Admitting: Licensed Clinical Social Worker

## 2019-10-06 DIAGNOSIS — E039 Hypothyroidism, unspecified: Secondary | ICD-10-CM | POA: Diagnosis not present

## 2019-10-06 DIAGNOSIS — M8949 Other hypertrophic osteoarthropathy, multiple sites: Secondary | ICD-10-CM | POA: Diagnosis not present

## 2019-10-06 DIAGNOSIS — I10 Essential (primary) hypertension: Secondary | ICD-10-CM | POA: Diagnosis not present

## 2019-10-06 DIAGNOSIS — Z8679 Personal history of other diseases of the circulatory system: Secondary | ICD-10-CM

## 2019-10-06 DIAGNOSIS — M159 Polyosteoarthritis, unspecified: Secondary | ICD-10-CM

## 2019-10-06 DIAGNOSIS — K219 Gastro-esophageal reflux disease without esophagitis: Secondary | ICD-10-CM

## 2019-10-06 NOTE — Patient Instructions (Addendum)
Licensed Clinical Social Worker Visit Information  Goals we discussed today:  Goals        . Client wants to talk with LCSW about her anxiety/stress related to fall potential (pt-stated)     Current Barriers:  . At risk for falls . Decreased appetite . Pain issues faced in client with Chronic Diagnoses of  HTN, GERD, OA, and Atrial Fibrillation . Tremor in her hands  Clinical Social Work Clinical Goal(s):  [name] LCSW will talk with clinet and with [name] in next 4 week to discuss client anxiety or stress related to fall potential  Interventions: . Talked with [name], grandaughte rof clinet about CCM program services . Encouraged [name] or client to call RNCM to discuss nurisng support for client . Talked with [name] abut upcoming client appointments . Talked with [name] about pain issues of client . Talked with [name] about decreased appetite of client . Talked with [name] about ambulation issues of client (client uses a walker as needed)  Talked with [name] about physical therapy sessions received by client  Patient Self Care Activities:  . Attends all scheduled provider appointments  Patient Self Care Deficits . Unable to self administer medications as prescribed . Needs help with ADLs . Need transport help  Initial Care Plan documentation      Materials Provided: No  Follow Up Plan:LCSW to call client or granddaughter of client in next 4 weeks to talk about anxiety/stress issues of client related to fall potential  The patient /[name] Romona Curls, granddaughter, verbalized understanding of instructions provided today and declined a print copy of patient instruction materials.    [name].Shemiah Rosch MSW, LCSW Licensed Clinical Social Worker [facility] [phone]

## 2019-10-06 NOTE — Chronic Care Management (AMB) (Signed)
  Care Management Note   Elaine Kennedy is a 86 y.o. year old female who is a primary care patient of Terald Sleeper, PA-C. The CM team was consulted for assistance with chronic disease management and care coordination.   I reached out to [employer], granddaughter, by phone today.   Review of patient status, including review of consultants reports, relevant laboratory and other test results, and collaboration with appropriate care team members and the patient's provider was performed as part of comprehensive patient evaluation and provision of chronic care management services.   Social determinants of health: risk of social isolation; risk of depression ; risk of physical inactivity    Chronic Care Management from 09/08/2019 in [facility]  PHQ-9 Total Score  7     GAD 7 : Generalized Anxiety Score 09/08/2019  Nervous, Anxious, on Edge 1  Control/stop worrying 1  Worry too much - different things 1  Trouble relaxing 1  Restless 0  Easily annoyed or irritable 0  Afraid - awful might happen 1  Total GAD 7 Score 5  Anxiety Difficulty Somewhat difficult   Medications   acetaminophen (TYLENOL) 650 MG CR tablet aspirin EC 81 MG tablet Baclofen 5 MG TABS cholecalciferol (VITAMIN D) 1000 units tablet ferrous sulfate 325 (65 FE) MG tablet hydrochlorothiazide (HYDRODIURIL) 12.5 MG tablet levothyroxine (SYNTHROID) 88 MCG tablet metoprolol succinate (TOPROL-XL) 50 MG 24 hr tablet Multiple Vitamins-Minerals (MULTIVITAMIN WITH MINERALS) tablet omeprazole (PRILOSEC) 40 MG capsule Polyethylene Glycol 400 (BLINK TEARS OP) sertraline (ZOLOFT) 50 MG tablet  Goals        . Client wants to talk with LCSW about her anxiety/stress related to fall potential (pt-stated)     Current Barriers:  . At risk for falls . Decreased appetite . Pain issues faced in client with Chronic Diagnoses of  HTN, GERD, OA, and Atrial Fibrillation . Tremor in her  hands  Clinical Social Work Clinical Goal(s):  [name] LCSW will talk with clinet and with Elaine Kennedy in next 4 week to discuss client anxiety or stress related to fall potential  Interventions: . Talked with Elaine Kennedy, grandaughte rof clinet about CCM program services . Encouraged Elaine Kennedy or client to call RNCM to discuss nurisng support for client . Talked with Elaine Kennedy abut upcoming client appointments . Talked with Elaine Kennedy about pain issues of client . Talked with Elaine Kennedy about decreased appetite of client . Talked with Elaine Kennedy about ambulation issues of client (client uses a walker as needed) . Talked with Elaine Kennedy about physical therapy sessions received by client  Patient Self Care Activities:  . Attends all scheduled provider appointments  Patient Self Care Deficits . Unable to self administer medications as prescribed . Needs help with ADLs . Need transport help  Initial Care Plan documentation      Follow Up Plan: LCSW to call client or granddaughter of client in next 4 weeks to talk about anxiety/stress issues of client related to fall potential  [name].London Nonaka MSW, LCSW Licensed Clinical Social Worker [facility] [phone]

## 2019-10-10 ENCOUNTER — Other Ambulatory Visit: Payer: Self-pay

## 2019-10-10 DIAGNOSIS — S81811D Laceration without foreign body, right lower leg, subsequent encounter: Secondary | ICD-10-CM | POA: Diagnosis not present

## 2019-10-10 DIAGNOSIS — M1991 Primary osteoarthritis, unspecified site: Secondary | ICD-10-CM | POA: Diagnosis not present

## 2019-10-10 DIAGNOSIS — K219 Gastro-esophageal reflux disease without esophagitis: Secondary | ICD-10-CM | POA: Diagnosis not present

## 2019-10-10 DIAGNOSIS — I872 Venous insufficiency (chronic) (peripheral): Secondary | ICD-10-CM | POA: Diagnosis not present

## 2019-10-10 DIAGNOSIS — Z9181 History of falling: Secondary | ICD-10-CM | POA: Diagnosis not present

## 2019-10-10 DIAGNOSIS — Z85828 Personal history of other malignant neoplasm of skin: Secondary | ICD-10-CM | POA: Diagnosis not present

## 2019-10-10 DIAGNOSIS — S81812D Laceration without foreign body, left lower leg, subsequent encounter: Secondary | ICD-10-CM | POA: Diagnosis not present

## 2019-10-10 DIAGNOSIS — E039 Hypothyroidism, unspecified: Secondary | ICD-10-CM | POA: Diagnosis not present

## 2019-10-10 DIAGNOSIS — I4891 Unspecified atrial fibrillation: Secondary | ICD-10-CM | POA: Diagnosis not present

## 2019-10-10 DIAGNOSIS — S51812A Laceration without foreign body of left forearm, initial encounter: Secondary | ICD-10-CM | POA: Diagnosis not present

## 2019-10-11 ENCOUNTER — Other Ambulatory Visit: Payer: Self-pay | Admitting: Physician Assistant

## 2019-10-11 ENCOUNTER — Ambulatory Visit (INDEPENDENT_AMBULATORY_CARE_PROVIDER_SITE_OTHER): Payer: Medicare Other

## 2019-10-11 DIAGNOSIS — M1991 Primary osteoarthritis, unspecified site: Secondary | ICD-10-CM | POA: Diagnosis not present

## 2019-10-11 DIAGNOSIS — K219 Gastro-esophageal reflux disease without esophagitis: Secondary | ICD-10-CM

## 2019-10-11 DIAGNOSIS — Z9181 History of falling: Secondary | ICD-10-CM

## 2019-10-11 DIAGNOSIS — S81811D Laceration without foreign body, right lower leg, subsequent encounter: Secondary | ICD-10-CM | POA: Diagnosis not present

## 2019-10-11 DIAGNOSIS — Z85828 Personal history of other malignant neoplasm of skin: Secondary | ICD-10-CM

## 2019-10-11 DIAGNOSIS — S51812D Laceration without foreign body of left forearm, subsequent encounter: Secondary | ICD-10-CM | POA: Diagnosis not present

## 2019-10-11 DIAGNOSIS — S81812D Laceration without foreign body, left lower leg, subsequent encounter: Secondary | ICD-10-CM

## 2019-10-11 DIAGNOSIS — I4891 Unspecified atrial fibrillation: Secondary | ICD-10-CM

## 2019-10-11 DIAGNOSIS — E039 Hypothyroidism, unspecified: Secondary | ICD-10-CM

## 2019-10-11 DIAGNOSIS — I872 Venous insufficiency (chronic) (peripheral): Secondary | ICD-10-CM

## 2019-10-12 ENCOUNTER — Other Ambulatory Visit: Payer: Self-pay

## 2019-10-12 ENCOUNTER — Encounter: Payer: Self-pay | Admitting: Physician Assistant

## 2019-10-12 ENCOUNTER — Ambulatory Visit (INDEPENDENT_AMBULATORY_CARE_PROVIDER_SITE_OTHER): Payer: Medicare Other | Admitting: Physician Assistant

## 2019-10-12 VITALS — BP 152/96 | HR 86 | Temp 98.6°F | Ht 63.0 in

## 2019-10-12 DIAGNOSIS — R251 Tremor, unspecified: Secondary | ICD-10-CM

## 2019-10-12 DIAGNOSIS — K117 Disturbances of salivary secretion: Secondary | ICD-10-CM

## 2019-10-15 NOTE — Progress Notes (Signed)
BP (!) 152/96    Pulse 86    Temp 98.6 F (37 C) (Temporal)    Ht _0  (1.6 m)    SpO2 97%    BMI 24.45 kg/m    Subjective:    Patient ID: Elaine Kennedy, female    DOB: 1927/07/12, 86 y.o.   MRN: [id number]  HPI 1. Hypersalivation  2. Tremor of both hands   HPI: Elaine Kennedy is a 86 y.o. female presenting on 10/12/2019 for Argumentative   This patient comes in for recheck and some changes in her overall demeanor.  She is having some changes in her sleep.  She feels like she is not sleeping but all she is regarding states that she does have naps frequently during the day.  She has become argumentative about doing any of the physical therapy always being performed through home health.  She has greatly decreased her intake as stated.  She has also continued with significant tremor of both hands.  Her gait is quite slow and shuffling.  She was seen Dr Edrick Oh over the past 2 years in the setting of patient divorce this year  Recently we have tried some Zoloft to see if he can help with some of her mood change.  Caused her to have stool body movements like she was marching, flailing her arms.  And she felt drowsy.  They discontinued the medicine after just a few days.  At this time I would like for him to go to neurology for evaluation.  Daughter is here with her and we will get this set up as soon as possible.  Past Medical History:  Diagnosis Date   Arthritis    Cancer ([place])    skin cancer on head.   Dysrhythmia    AFib   GERD (gastroesophageal reflux disease)    Hypothyroidism    Relevant past medical, surgical, family and social history reviewed and updated as indicated. Interim medical history since our last visit reviewed. Allergies and medications reviewed and updated. DATA REVIEWED: CHART IN EPIC  Family History reviewed for pertinent findings.  Review of Systems  Constitutional: Negative.   HENT: Negative.   Eyes: Negative.   Respiratory: Negative.     Cardiovascular: Positive for leg swelling.  Gastrointestinal: Negative.   Genitourinary: Negative.   Musculoskeletal: Positive for gait problem.  Neurological: Positive for tremors and weakness.  Psychiatric/Behavioral: Positive for agitation and behavioral problems.    Allergies as of 10/12/2019      Reactions   Sulfa Antibiotics Diarrhea      Medication List       Accurate as of October 12, 2019 11:59 PM. If you have any questions, ask your nurse or doctor.        STOP taking these medications   sertraline 50 MG tablet Commonly known as: ZOLOFT Stopped by: Terald Sleeper, PA-C     TAKE these medications   acetaminophen 650 MG CR tablet Commonly known as: TYLENOL Take 650 mg by mouth every 8 (eight) hours as needed for pain.   aspirin EC 81 MG tablet Take 81 mg by mouth daily.   Baclofen 5 MG Tabs Take 5 mg by mouth 3 (three) times daily as needed.   BLINK TEARS OP Place 1 drop into both eyes 3 (three) times daily as needed (dry eyes).   cholecalciferol 1000 units tablet Commonly known as: VITAMIN D Take 1,000 Units by mouth daily.   ferrous sulfate 325 (65 FE) MG tablet  Take 325 mg by mouth daily with breakfast.   hydrochlorothiazide 12.5 MG tablet Commonly known as: HYDRODIURIL TAKE 1 TABLET (12.5 MG TOTAL) BY MOUTH DAILY AS NEEDED (SWELLING).   levothyroxine 88 MCG tablet Commonly known as: SYNTHROID TAKE ONE TABLET (88 MCG TOTAL) BY MOUTH DAILY.   metoprolol succinate 50 MG 24 hr tablet Commonly known as: TOPROL-XL TAKE 1 TABLET BY MOUTH EVERY DAY   multivitamin with minerals tablet Take 1 tablet by mouth daily. Spectra-vite   omeprazole 40 MG capsule Commonly known as: PRILOSEC Take 1 capsule (40 mg total) by mouth daily.          Objective:    BP (!) 152/96    Pulse 86    Temp 98.6 F (37 C) (Temporal)    Ht _0  (1.6 m)    SpO2 97%    BMI 24.45 kg/m   Allergies  Allergen Reactions   Sulfa Antibiotics Diarrhea    Wt Readings  from Last 3 Encounters:  07/26/19 138 lb (62.6 kg)  06/19/19 142 lb (64.4 kg)  05/16/19 146 lb 6.4 oz (66.4 kg)    Physical Exam Constitutional:      General: She is not in acute distress.    Appearance: Normal appearance. She is well-developed and well-groomed. She is ill-appearing.  HENT:     Head: Normocephalic and atraumatic.  Cardiovascular:     Rate and Rhythm: Normal rate.  Pulmonary:     Effort: Pulmonary effort is normal.  Skin:    General: Skin is warm and dry.     Findings: No rash.  Neurological:     Mental Status: She is oriented to person, place, and time and easily aroused.     Motor: Tremor present.     Deep Tendon Reflexes: Reflexes are normal and symmetric.     Comments: Sitting in wheelchair during visit     Results for orders placed or performed in visit on 09/19/19  Urine Culture   Specimen: Urine   URINE  Result Value Ref Range   Urine Culture, Routine Final report    Organism ID, Bacteria Comment   Microscopic Examination   URINE  Result Value Ref Range   WBC, UA 6-10 (A) 0 - 5 /hpf   RBC 3-10 (A) 0 - 2 /hpf   Epithelial Cells (non renal) 0-10 0 - 10 /hpf   Renal Epithel, UA None seen None seen /hpf   Bacteria, UA Few None seen/Few  CBC with Differential/Platelet  Result Value Ref Range   WBC 4.6 3.4 - 10.8 x10E3/uL   RBC 3.97 3.77 - 5.28 x10E6/uL   Hemoglobin 11.2 11.1 - 15.9 g/dL   Hematocrit 35.0 34.0 - 46.6 %   MCV 88 79 - 97 fL   MCH 28.2 26.6 - 33.0 pg   MCHC 32.0 31.5 - 35.7 g/dL   RDW 14.3 11.7 - 15.4 %   Platelets 226 150 - 450 x10E3/uL   Neutrophils 55 Not Estab. %   Lymphs 33 Not Estab. %   Monocytes 9 Not Estab. %   Eos 2 Not Estab. %   Basos 1 Not Estab. %   Neutrophils Absolute 2.6 1.4 - 7.0 x10E3/uL   Lymphocytes Absolute 1.5 0.7 - 3.1 x10E3/uL   Monocytes Absolute 0.4 0.1 - 0.9 x10E3/uL   EOS (ABSOLUTE) 0.1 0.0 - 0.4 x10E3/uL   Basophils Absolute 0.1 0.0 - 0.2 x10E3/uL   Immature Granulocytes 0 Not Estab. %    Immature Grans (Abs) 0.0 0.0 -  0.1 x10E3/uL  CMP14+EGFR  Result Value Ref Range   Glucose 93 65 - 99 mg/dL   BUN 15 10 - 36 mg/dL   Creatinine, Ser 0.83 0.57 - 1.00 mg/dL   GFR calc non Af Amer 62 >59 mL/min/1.73   GFR calc Af Amer 72 >59 mL/min/1.73   BUN/Creatinine Ratio 18 12 - 28   Sodium 141 134 - 144 mmol/L   Potassium 3.4 (L) 3.5 - 5.2 mmol/L   Chloride 102 96 - 106 mmol/L   CO2 24 20 - 29 mmol/L   Calcium 8.5 (L) 8.7 - 10.3 mg/dL   Total Protein 6.4 6.0 - 8.5 g/dL   Albumin 4.1 3.5 - 4.6 g/dL   Globulin, Total 2.3 1.5 - 4.5 g/dL   Albumin/Globulin Ratio 1.8 1.2 - 2.2   Bilirubin Total 0.3 0.0 - 1.2 mg/dL   Alkaline Phosphatase 83 39 - 117 IU/L   AST 19 0 - 40 IU/L   ALT 10 0 - 32 IU/L  TSH  Result Value Ref Range   TSH 2.200 0.450 - 4.500 uIU/mL  Urinalysis, Complete  Result Value Ref Range   Specific Gravity, UA 1.015 1.005 - 1.030   pH, UA 6.0 5.0 - 7.5   Color, UA Yellow Yellow   Appearance Ur Clear Clear   Leukocytes,UA Negative Negative   Protein,UA Negative Negative/Trace   Glucose, UA Negative Negative   Ketones, UA Negative Negative   RBC, UA Trace (A) Negative   Bilirubin, UA Negative Negative   Urobilinogen, Ur 0.2 0.2 - 1.0 mg/dL   Nitrite, UA Negative Negative   Microscopic Examination See below:   Microalbumin / creatinine urine ratio  Result Value Ref Range   Creatinine, Urine 51.1 Not Estab. mg/dL   Microalbumin, Urine 6.5 Not Estab. ug/mL   Microalb/Creat Ratio 13 0 - 29 mg/g creat      Assessment & Plan:   1. Hypersalivation - Ambulatory referral to Neurology  2. Tremor of both hands - Ambulatory referral to Neurology   Continue all other maintenance medications as listed above.  Follow up plan: No follow-ups on file.  Educational handout given for parkinson's  Terald Sleeper PA-C [facility] [address]  [place], [place] [address] [phone]   10/15/2019, 10:17 PM

## 2019-10-16 ENCOUNTER — Ambulatory Visit (INDEPENDENT_AMBULATORY_CARE_PROVIDER_SITE_OTHER): Payer: Medicare Other | Admitting: *Deleted

## 2019-10-16 ENCOUNTER — Telehealth: Payer: Self-pay | Admitting: Physician Assistant

## 2019-10-16 DIAGNOSIS — M159 Polyosteoarthritis, unspecified: Secondary | ICD-10-CM

## 2019-10-16 DIAGNOSIS — Z9181 History of falling: Secondary | ICD-10-CM | POA: Diagnosis not present

## 2019-10-16 DIAGNOSIS — M6281 Muscle weakness (generalized): Secondary | ICD-10-CM | POA: Diagnosis not present

## 2019-10-16 DIAGNOSIS — Z8679 Personal history of other diseases of the circulatory system: Secondary | ICD-10-CM

## 2019-10-16 NOTE — Chronic Care Management (AMB) (Signed)
  Chronic Care Management   Initial Visit Note  10/16/2019 Name: Elaine Kennedy MRN: [id number] DOB: Jun 06, 1927  Referred by: Terald Sleeper, PA-C Reason for referral : Chronic Care Management (RN Initial Outreach)   Elaine Kennedy is a 86 y.o. year old female who is a primary care patient of Terald Sleeper, PA-C. The CCM team was consulted for assistance with chronic disease management and care coordination needs related to HTN, hypothyroidism, OA, Atrial Fibrillation, GERD, chronic stasis dermatitis, high risk for falls, generalized muscle weakness.  Review of patient status, including review of consultants reports, relevant laboratory and other test results, and collaboration with appropriate care team members and the patient's provider was performed as part of comprehensive patient evaluation and provision of chronic care management services.    SDOH (Social Determinants of Health) assessments performed: No See Care Plan activities for detailed interventions related to [place])    I reached out to Elaine Kennedy by telephone today and was able to briefly speak with her daughter, Elaine Kennedy. I was unable to complete the initial visit because Elaine Kennedy was assisting Elaine Kennedy out of the shower, and seemed to be having a difficult time.    RN Care Plan            This Visit's Progress   . Personal Care Services       CARE PLAN ENTRY (see longtitudinal plan of care for additional care plan information)  Current Barriers:  [name] Knowledge Deficits related to personal care services in a 86 year old patient with arthritis and Afib who has generalized muscle weakness and is a high risk for falls.  . Cognitive Deficits  Nurse Case Manager Clinical Goal(s):  [name] Over the next 30 days, patient will work with CCM Team to address needs related to personal care services  Interventions:  . Chart reviewed, including recent office notes and labs . Collaboration with LCSW . Outreach to patient. Was able to briefly  talk with daughter but was not able to complete call because they were assisting Elaine Kennedy out of the shower at that time . RN will talk with patient/family over the next 14 days . RN will recommend considering PCS that is covered by [facility] Medicaid  Patient Self Care Activities:  . Unable to perform ADLs independently . Unable to perform IADLs independently  Initial goal documentation         Follow-up Plan:   The care management team will reach out to the patient again over the next 14 days.   Chong Sicilian, BSN, RN-BC Embedded Chronic Care Manager [facility] / [place] Management Direct Dial: [phone]

## 2019-10-16 NOTE — Patient Instructions (Signed)
Visit Information  Goals Addressed            This Visit's Progress   . Personal Care Services       CARE PLAN ENTRY (see longtitudinal plan of care for additional care plan information)  Current Barriers:  [name] Knowledge Deficits related to personal care services in a 86 year old patient with arthritis and Afib who has generalized muscle weakness and is a high risk for falls.  . Cognitive Deficits  Nurse Case Manager Clinical Goal(s):  [name] Over the next 30 days, patient will work with CCM Team to address needs related to personal care services  Interventions:  . Chart reviewed, including recent office notes and labs . Collaboration with LCSW . Outreach to patient. Was able to briefly talk with daughter but was not able to complete call because they were assisting Ms Delay out of the shower at that time . RN will talk with patient/family over the next 14 days . RN will recommend considering PCS that is covered by [facility] Medicaid  Patient Self Care Activities:  . Unable to perform ADLs independently . Unable to perform IADLs independently  Initial goal documentation        The patient verbalized understanding of instructions provided today and declined a print copy of patient instruction materials.   Follow-up Plan The care management team will reach out to the patient again over the next 14 days.   Chong Sicilian, BSN, RN-BC Embedded Chronic Care Manager [facility] [place] Management Direct Dial: [phone]

## 2019-10-16 NOTE — Telephone Encounter (Signed)
Thanks for letting us know. 

## 2019-10-16 NOTE — Telephone Encounter (Signed)
FYI- Daughter states that patient had a fall but did NOT hurt herself . States she has a little scratch on her arm. FYI

## 2019-10-18 ENCOUNTER — Other Ambulatory Visit: Payer: Self-pay | Admitting: Physician Assistant

## 2019-10-18 ENCOUNTER — Telehealth: Payer: Self-pay | Admitting: Physician Assistant

## 2019-10-18 DIAGNOSIS — T148XXA Other injury of unspecified body region, initial encounter: Secondary | ICD-10-CM | POA: Diagnosis not present

## 2019-10-18 DIAGNOSIS — W19XXXA Unspecified fall, initial encounter: Secondary | ICD-10-CM | POA: Diagnosis not present

## 2019-10-18 NOTE — Telephone Encounter (Signed)
Short term [facility] may not be. I know that [facility] in [place] is. I was thinking patient was still getting services from [facility]

## 2019-10-18 NOTE — Telephone Encounter (Signed)
Patient fell again this morning. Her caregiver is going to clean and bandage her. Offered appointment daughter declined. Wants placement for short term rehab from all her falls. Schedule 30 min face to face with you. When she was able. Just FYI

## 2019-10-18 NOTE — Telephone Encounter (Signed)
Nursing homes are not accepting admissions from outside.  Transfers from the hospital do happen.  If she is having a lot of issues, maybe go to ED and hopefully she can be admitted and then transferred. IS that right [place]?

## 2019-10-19 ENCOUNTER — Telehealth: Payer: Self-pay | Admitting: Physician Assistant

## 2019-10-19 NOTE — Telephone Encounter (Signed)
Is the family need to reach out to [facility] if they are interested in services?

## 2019-10-19 NOTE — Telephone Encounter (Signed)
TC to [facility] they will do a F2F to have PT [address] started again at the visit that is scheduled for 3/15

## 2019-10-19 NOTE — Telephone Encounter (Signed)
I just saw her in office on2 /26, will that work, I can edit.

## 2019-10-19 NOTE — Telephone Encounter (Signed)
Mother getting is nursing homes and not excepting people from outside.  So it is very unlikely that she will get admitted anyway.  We would have to take a hospital admission and then transferring to rehab.

## 2019-10-20 ENCOUNTER — Other Ambulatory Visit: Payer: Medicare Other

## 2019-10-20 DIAGNOSIS — N390 Urinary tract infection, site not specified: Secondary | ICD-10-CM | POA: Diagnosis not present

## 2019-10-20 LAB — URINALYSIS, COMPLETE
Bilirubin, UA: NEGATIVE
Glucose, UA: NEGATIVE
Ketones, UA: NEGATIVE
Nitrite, UA: NEGATIVE
Protein,UA: NEGATIVE
RBC, UA: NEGATIVE
Specific Gravity, UA: 1.025 (ref 1.005–1.030)
Urobilinogen, Ur: 0.2 mg/dL (ref 0.2–1.0)
pH, UA: 5.5 (ref 5.0–7.5)

## 2019-10-20 LAB — MICROSCOPIC EXAMINATION
Epithelial Cells (non renal): >10 /hpf — AB (ref 0–10)
RBC, Urine: NONE SEEN /hpf (ref 0–2)
Renal Epithel, UA: NONE SEEN /hpf

## 2019-10-21 LAB — URINE CULTURE

## 2019-10-23 DIAGNOSIS — M170 Bilateral primary osteoarthritis of knee: Secondary | ICD-10-CM | POA: Insufficient documentation

## 2019-10-23 DIAGNOSIS — M6281 Muscle weakness (generalized): Secondary | ICD-10-CM | POA: Diagnosis not present

## 2019-10-23 DIAGNOSIS — Z96642 Presence of left artificial hip joint: Secondary | ICD-10-CM | POA: Diagnosis not present

## 2019-10-24 ENCOUNTER — Telehealth: Payer: Self-pay | Admitting: Physician Assistant

## 2019-10-24 NOTE — Telephone Encounter (Signed)
Social worker calls states patient is constantly falling. Has bed alarm. Patient refuses wheelchair, refuses ED visits when falls. Refusing to eat, refusing to bathe. Asking who determines if patient is competent to make own health decisions.

## 2019-10-24 NOTE — Telephone Encounter (Signed)
Spoke with patients pcp nurse and she advised either neurology or contact adult protective services. Social worker agrees.

## 2019-10-26 ENCOUNTER — Ambulatory Visit: Payer: Medicare Other | Admitting: *Deleted

## 2019-10-26 ENCOUNTER — Telehealth: Payer: Self-pay | Admitting: Physician Assistant

## 2019-10-26 DIAGNOSIS — R0689 Other abnormalities of breathing: Secondary | ICD-10-CM | POA: Diagnosis not present

## 2019-10-26 DIAGNOSIS — I5031 Acute diastolic (congestive) heart failure: Secondary | ICD-10-CM | POA: Diagnosis not present

## 2019-10-26 DIAGNOSIS — Z66 Do not resuscitate: Secondary | ICD-10-CM | POA: Diagnosis not present

## 2019-10-26 DIAGNOSIS — E860 Dehydration: Secondary | ICD-10-CM | POA: Diagnosis not present

## 2019-10-26 DIAGNOSIS — R5381 Other malaise: Secondary | ICD-10-CM | POA: Diagnosis not present

## 2019-10-26 DIAGNOSIS — R296 Repeated falls: Secondary | ICD-10-CM | POA: Diagnosis not present

## 2019-10-26 DIAGNOSIS — Z9181 History of falling: Secondary | ICD-10-CM

## 2019-10-26 DIAGNOSIS — R531 Weakness: Secondary | ICD-10-CM | POA: Diagnosis not present

## 2019-10-26 DIAGNOSIS — R000 Tachycardia, unspecified: Secondary | ICD-10-CM | POA: Diagnosis not present

## 2019-10-26 DIAGNOSIS — Z7982 Long term (current) use of aspirin: Secondary | ICD-10-CM | POA: Diagnosis not present

## 2019-10-26 DIAGNOSIS — E876 Hypokalemia: Secondary | ICD-10-CM | POA: Diagnosis not present

## 2019-10-26 DIAGNOSIS — Z20822 Contact with and (suspected) exposure to covid-19: Secondary | ICD-10-CM | POA: Diagnosis not present

## 2019-10-26 DIAGNOSIS — Z96642 Presence of left artificial hip joint: Secondary | ICD-10-CM | POA: Diagnosis not present

## 2019-10-26 DIAGNOSIS — Z85828 Personal history of other malignant neoplasm of skin: Secondary | ICD-10-CM | POA: Diagnosis not present

## 2019-10-26 DIAGNOSIS — R0902 Hypoxemia: Secondary | ICD-10-CM | POA: Diagnosis not present

## 2019-10-26 DIAGNOSIS — E039 Hypothyroidism, unspecified: Secondary | ICD-10-CM | POA: Diagnosis not present

## 2019-10-26 DIAGNOSIS — I4819 Other persistent atrial fibrillation: Secondary | ICD-10-CM | POA: Diagnosis not present

## 2019-10-26 DIAGNOSIS — I5033 Acute on chronic diastolic (congestive) heart failure: Secondary | ICD-10-CM | POA: Diagnosis not present

## 2019-10-26 DIAGNOSIS — Z743 Need for continuous supervision: Secondary | ICD-10-CM | POA: Diagnosis not present

## 2019-10-26 DIAGNOSIS — I509 Heart failure, unspecified: Secondary | ICD-10-CM | POA: Diagnosis not present

## 2019-10-26 DIAGNOSIS — F0391 Unspecified dementia with behavioral disturbance: Secondary | ICD-10-CM

## 2019-10-26 DIAGNOSIS — A09 Infectious gastroenteritis and colitis, unspecified: Secondary | ICD-10-CM | POA: Diagnosis not present

## 2019-10-26 DIAGNOSIS — R197 Diarrhea, unspecified: Secondary | ICD-10-CM | POA: Diagnosis not present

## 2019-10-26 DIAGNOSIS — I639 Cerebral infarction, unspecified: Secondary | ICD-10-CM | POA: Diagnosis not present

## 2019-10-26 DIAGNOSIS — I10 Essential (primary) hypertension: Secondary | ICD-10-CM | POA: Diagnosis not present

## 2019-10-26 DIAGNOSIS — K219 Gastro-esophageal reflux disease without esophagitis: Secondary | ICD-10-CM | POA: Diagnosis not present

## 2019-10-26 DIAGNOSIS — I4821 Permanent atrial fibrillation: Secondary | ICD-10-CM | POA: Diagnosis not present

## 2019-10-26 DIAGNOSIS — S0990XA Unspecified injury of head, initial encounter: Secondary | ICD-10-CM | POA: Diagnosis not present

## 2019-10-26 DIAGNOSIS — I4891 Unspecified atrial fibrillation: Secondary | ICD-10-CM | POA: Diagnosis not present

## 2019-10-26 DIAGNOSIS — I110 Hypertensive heart disease with heart failure: Secondary | ICD-10-CM | POA: Diagnosis not present

## 2019-10-26 NOTE — Telephone Encounter (Signed)
The urine culture came back normal. If there are symptoms, please recollect. Thanks, WS

## 2019-10-26 NOTE — Telephone Encounter (Signed)
Daughter aware.

## 2019-10-26 NOTE — Patient Instructions (Signed)
Visit Information  Goals Addressed            This Visit's Progress   . Fall Prevention       Current Barriers:  [name] Knowledge Deficits related to fall precautions . Decreased adherence to prescribed treatment for fall prevention . Patient is uncooperative with safety measures  Nurse Case Manager Clinical Goal(s):  [name] Over the next 30 days, patient will work with family/SNF to prevent falls . Over the next 15 days, patient will receive assistance at SNF  Interventions:  . Chart reviewed including recent office notes and telephone calls . Talked with family by telephone who reports that Ms Wietecha is uncooperative with fall prevention measures and at times seems to throw herself backwards when they are trying to provide assistance o They have called EMS for assistance today . Discussed that SNF aren't receiving patient's from home but that if she is admitted, she can be transferred from the hospital to a SNF . Encouraged family to reach out to [facility] team and PCP as needed . RN will monitor chart for admission and discharge plans over the next few days . RN will reach back out to patient/family over the next 7 days  Patient Self Care Activities:  . Patient does not perform ADLs or IADls independently     Please see past updates related to this goal by clicking on the "Past Updates" button in the selected goal         The patient verbalized understanding of instructions provided today and declined a print copy of patient instruction materials.   Follow-up Plan The care management team will reach out to the patient again over the next 7 days.   Chong Sicilian, BSN, RN-BC Embedded Chronic Care Manager [facility] [place] Management Direct Dial: [phone]

## 2019-10-26 NOTE — Chronic Care Management (AMB) (Signed)
Chronic Care Management   Follow Up Note   10/26/2019 Name: MICHEALE SCANLIN MRN: [id number] DOB: 1927/07/14  Referred by: Terald Sleeper, PA-C Reason for referral : Chronic Care Management (RN Follow up)   ZANYLA JORDE is a 86 y.o. year old female who is a primary care patient of Terald Sleeper, PA-C. The CCM team was consulted for assistance with chronic disease management and care coordination needs.    Review of patient status, including review of consultants reports, relevant laboratory and other test results, and collaboration with appropriate care team members and the patient's provider was performed as part of comprehensive patient evaluation and provision of chronic care management services.    SDOH (Social Determinants of Health) assessments performed: No See Care Plan activities for detailed interventions related to [place])    I reached out to Ms Leek and her family by telephone today to complete her initial assessment. I was unable to complete the assessment because that time they were waiting for EMS to arrive to transport her to the hospital. She has had multiple falls and is uncooperative and they are in need of SNF placement.   Outpatient Encounter Medications as of 10/26/2019  Medication Sig Note  . acetaminophen (TYLENOL) 650 MG CR tablet Take 650 mg by mouth every 8 (eight) hours as needed for pain.    [name] aspirin EC 81 MG tablet Take 81 mg by mouth daily.   . Baclofen 5 MG TABS Take 5 mg by mouth 3 (three) times daily as needed.   . cholecalciferol (VITAMIN D) 1000 units tablet Take 1,000 Units by mouth daily.   . ferrous sulfate 325 (65 FE) MG tablet Take 325 mg by mouth daily with breakfast.   . hydrochlorothiazide (HYDRODIURIL) 12.5 MG tablet TAKE 1 TABLET (12.5 MG TOTAL) BY MOUTH DAILY AS NEEDED (SWELLING).   [name] levothyroxine (SYNTHROID) 88 MCG tablet TAKE ONE TABLET (88 MCG TOTAL) BY MOUTH DAILY.   . metoprolol succinate (TOPROL-XL) 50 MG 24 hr tablet TAKE 1 TABLET BY  MOUTH EVERY DAY 06/19/2019: Takes one half pill, 25 mg  . Multiple Vitamins-Minerals (MULTIVITAMIN WITH MINERALS) tablet Take 1 tablet by mouth daily. Spectra-vite   . omeprazole (PRILOSEC) 40 MG capsule TAKE 1 CAPSULE BY MOUTH EVERY DAY   . Polyethylene Glycol 400 (BLINK TEARS OP) Place 1 drop into both eyes 3 (three) times daily as needed (dry eyes).    No facility-administered encounter medications on file as of 10/26/2019.     RN Care Plan   . Fall Prevention       Current Barriers:  [name] Knowledge Deficits related to fall precautions . Decreased adherence to prescribed treatment for fall prevention . Patient is uncooperative with safety measures  Nurse Case Manager Clinical Goal(s):  [name] Over the next 30 days, patient will work with family/SNF to prevent falls . Over the next 15 days, patient will receive assistance at SNF  Interventions:  . Chart reviewed including recent office notes and telephone calls . Talked with family by telephone who reports that Ms Reissig is uncooperative with fall prevention measures and at times seems to throw herself backwards when they are trying to provide assistance o They have called EMS for assistance today . Discussed that SNF aren't receiving patient's from home but that if she is admitted, she can be transferred from the hospital to a SNF . Encouraged family to reach out to [facility] team and PCP as needed . RN will monitor chart for admission  and discharge plans over the next few days . RN will reach back out to patient/family over the next 7 days  Patient Self Care Activities:  . Patient does not perform ADLs or IADls independently     Please see past updates related to this goal by clicking on the "Past Updates" button in the selected goal          Plan:   The care management team will reach out to the patient again over the next 7 days.    Chong Sicilian, BSN, RN-BC Embedded Chronic Care Manager [facility] [place] Management Direct Dial: [phone]

## 2019-10-27 ENCOUNTER — Telehealth: Payer: Self-pay | Admitting: *Deleted

## 2019-10-27 ENCOUNTER — Telehealth: Payer: Self-pay | Admitting: Physician Assistant

## 2019-10-27 DIAGNOSIS — I509 Heart failure, unspecified: Secondary | ICD-10-CM | POA: Diagnosis not present

## 2019-10-27 DIAGNOSIS — I4891 Unspecified atrial fibrillation: Secondary | ICD-10-CM | POA: Diagnosis not present

## 2019-10-27 NOTE — Telephone Encounter (Signed)
    Transitional Care Management  Contact Attempt Attempt Date:10/27/2019 Attempted By: Eston Mould, LPN  1st unsuccessful TCM contact attempt.   I reached out to Elaine Kennedy on her preferred telephone number to discuss Transitional Care Management, medication reconciliation, and to schedule a TCM hospital follow-up with her PCP at [facility].  Discharge Date: 10/26/19 Location: [facility] Discharge Dx: unavailable  Recommendations for Outpatient Follow-up:   (insert from discharge summary)-We don't have a discharge summary at this time   Plan I left a HIPPA compliant message for her to return my call.  Will attempt to contact again within the 2 business day post discharge window if she does not return my call.

## 2019-10-27 NOTE — Telephone Encounter (Signed)
Hope that she gets better.  And I am glad they are able to treat her inpatient.

## 2019-10-27 NOTE — Telephone Encounter (Signed)
Pts daughter called to let Elaine Kennedy know that pt is at [facility] currently for UTI and possible congestive heart failure. Says pt had urine test done here recently and results were normal but says pt does have UTI for sure and is not doing good.

## 2019-10-27 NOTE — Telephone Encounter (Signed)
Left provider message on patient's voice mail and ask to call [facility] if anything is needed.

## 2019-10-30 ENCOUNTER — Telehealth: Payer: Self-pay | Admitting: *Deleted

## 2019-10-30 ENCOUNTER — Ambulatory Visit: Payer: Medicare Other | Admitting: Physician Assistant

## 2019-10-30 ENCOUNTER — Ambulatory Visit: Payer: Medicare Other | Admitting: *Deleted

## 2019-10-30 DIAGNOSIS — M6281 Muscle weakness (generalized): Secondary | ICD-10-CM

## 2019-10-30 DIAGNOSIS — M159 Polyosteoarthritis, unspecified: Secondary | ICD-10-CM

## 2019-10-30 DIAGNOSIS — Z7409 Other reduced mobility: Secondary | ICD-10-CM

## 2019-10-30 DIAGNOSIS — R296 Repeated falls: Secondary | ICD-10-CM

## 2019-10-30 NOTE — Telephone Encounter (Signed)
Called and spoke to pt's daughter and she states the pt is currently still admitted at the hospital and they will call when pt is discharged to schedule [facility] hospital follow up.

## 2019-10-30 NOTE — Chronic Care Management (AMB) (Signed)
  Chronic Care Management   Follow Up Note   10/30/2019 Name: Elaine Kennedy MRN: [id number] DOB: 29-Oct-1926  Referred by: Terald Sleeper, PA-C Reason for referral : Chronic Care Management (RN follow up)   [name] is a 86 y.o. year old female who is a primary care patient of Terald Sleeper, PA-C. The CCM team was consulted for assistance with chronic disease management and care coordination needs.    Review of patient status, including review of consultants reports, relevant laboratory and other test results, and collaboration with appropriate care team members and the patient's provider was performed as part of comprehensive patient evaluation and provision of chronic care management services.    RN Care Plan           This Visit's Progress   . Personal Care Services       CARE PLAN ENTRY (see longtitudinal plan of care for additional care plan information)  Current Barriers:  [name] Knowledge Deficits related to personal care services in a 86 year old patient with arthritis and Afib who has generalized muscle weakness and is a high risk for falls.  . Cognitive Deficits . Hospitalization  Nurse Case Manager Clinical Goal(s):  [name] Over the next 30 days, patient will work with CCM Team to address needs related to personal care services  Interventions:  . Reviewed Patient Pearletha [place]. Patient listed as admitted and discharged from [facility] on 10/26/2019.  [name] Chart reviewed including recent office notes, telephone notes, and lab results o Family member incoming call stated she was inpatient at [place] [phone] and verified that patient is still inpatient . RN will f/u with patient/ family over the next 7 days to assess status and need for in-home care at that time. Per previous conversations, patient's family would like to have her placed in a SNF. Hopefully that will be done at hosp discharge.   Patient Self Care Activities:  . Unable to perform  ADLs independently . Unable to perform IADLs independently  Please see past updates related to this goal by clicking on the "Past Updates" button in the selected goal          Plan:   The care management team will reach out to the patient again over the next 7  days.   Chong Sicilian, BSN, RN-BC Embedded Chronic Care Manager [facility] [place] Management Direct Dial: [phone]

## 2019-10-30 NOTE — Patient Instructions (Signed)
Visit Information  Goals Addressed            This Visit's Progress   . Personal Care Services       CARE PLAN ENTRY (see longtitudinal plan of care for additional care plan information)  Current Barriers:  [name] Knowledge Deficits related to personal care services in a 86 year old patient with arthritis and Afib who has generalized muscle weakness and is a high risk for falls.  . Cognitive Deficits . Hospitalization  Nurse Case Manager Clinical Goal(s):  [name] Over the next 30 days, patient will work with CCM Team to address needs related to personal care services  Interventions:  . Reviewed Patient Elaine Kennedy. Patient listed as admitted and discharged from [facility] on 10/26/2019.  [name] Chart reviewed including recent office notes, telephone notes, and lab results o Family member incoming call stated she was inpatient at [place] [phone] and verified that patient is still inpatient . RN will f/u with patient/ family over the next 7 days to assess status and need for in-home care at that time. Per previous conversations, patient's family would like to have her placed in a SNF. Hopefully that will be done at hosp discharge.   Patient Self Care Activities:  . Unable to perform ADLs independently . Unable to perform IADLs independently  Please see past updates related to this goal by clicking on the "Past Updates" button in the selected goal         The patient verbalized understanding of instructions provided today and declined a print copy of patient instruction materials.   Follow-up Plan The care management team will reach out to the patient again over the next 7 days.   Chong Sicilian, BSN, RN-BC Embedded Chronic Care Manager [facility] / [place] Management Direct Dial: [phone]

## 2019-11-01 ENCOUNTER — Encounter: Payer: Self-pay | Admitting: Family Medicine

## 2019-11-01 ENCOUNTER — Telehealth: Payer: Self-pay | Admitting: Physician Assistant

## 2019-11-01 ENCOUNTER — Ambulatory Visit (INDEPENDENT_AMBULATORY_CARE_PROVIDER_SITE_OTHER): Payer: Medicare Other | Admitting: Family Medicine

## 2019-11-01 DIAGNOSIS — I959 Hypotension, unspecified: Secondary | ICD-10-CM

## 2019-11-01 NOTE — Progress Notes (Signed)
Virtual Visit via Telephone Note  I connected with [place] daughter, Elaine Kennedy, on 11/01/19 at 2:38 PM by telephone and verified that I am speaking with the correct person using two identifiers. Elaine Kennedy is currently located at home in the bed and her daughter is currently with her during this visit. The provider, Loman Brooklyn, FNP is located in their home at time of visit.  I discussed the limitations, risks, security and privacy concerns of performing an evaluation and management service by telephone and the availability of in person appointments. I also discussed with the patient that there may be a patient responsible charge related to this service. The patient expressed understanding and agreed to proceed.  Subjective: PCP: Terald Sleeper, PA-C  Chief Complaint  Patient presents with  . Hypotension   Patient's daughter, Elaine Kennedy, reports patient was discharged from the hospital yesterday afternoon.  She was admitted at [facility].  I do not have the records.  Elaine Kennedy states the doctor told her that Ms. Longtin was to be on metoprolol 25 mg twice daily which they have been doing.  She also states this medication is not on the med list of the discharge papers they gave her.  Patient's blood pressure this morning was 80/71 at which point they gave her something to eat and encourage fluids.  Afterwards it came up to 124/66.  Elaine Kennedy does state that when the patient was up walking to the bathroom this morning she leaned over on her and said that she just did not feel like she could keep going.  Once her blood pressure came up she was feeling better.  HR: 92 O2: 99%   ROS: Per HPI  Current Outpatient Medications:  .  acetaminophen (TYLENOL) 650 MG CR tablet, Take 650 mg by mouth every 8 (eight) hours as needed for pain. , Disp: , Rfl:  .  aspirin EC 81 MG tablet, Take 81 mg by mouth daily., Disp: , Rfl:  .  Baclofen 5 MG TABS, Take 5 mg by mouth 3 (three) times daily as needed., Disp:  30 tablet, Rfl: 0 .  cholecalciferol (VITAMIN D) 1000 units tablet, Take 1,000 Units by mouth daily., Disp: , Rfl:  .  ferrous sulfate 325 (65 FE) MG tablet, Take 325 mg by mouth daily with breakfast., Disp: , Rfl:  .  hydrochlorothiazide (HYDRODIURIL) 12.5 MG tablet, TAKE 1 TABLET (12.5 MG TOTAL) BY MOUTH DAILY AS NEEDED (SWELLING)., Disp: 90 tablet, Rfl: 1 .  levothyroxine (SYNTHROID) 88 MCG tablet, TAKE ONE TABLET (88 MCG TOTAL) BY MOUTH DAILY., Disp: 90 tablet, Rfl: 0 .  metoprolol succinate (TOPROL-XL) 50 MG 24 hr tablet, TAKE 1 TABLET BY MOUTH EVERY DAY, Disp: 90 tablet, Rfl: 0 .  Multiple Vitamins-Minerals (MULTIVITAMIN WITH MINERALS) tablet, Take 1 tablet by mouth daily. Spectra-vite, Disp: , Rfl:  .  omeprazole (PRILOSEC) 40 MG capsule, TAKE 1 CAPSULE BY MOUTH EVERY DAY, Disp: 90 capsule, Rfl: 1 .  Polyethylene Glycol 400 (BLINK TEARS OP), Place 1 drop into both eyes 3 (three) times daily as needed (dry eyes)., Disp: , Rfl:   Allergies  Allergen Reactions  . Sulfa Antibiotics Diarrhea   Past Medical History:  Diagnosis Date  . Arthritis   . Cancer ([place])    skin cancer on head.  [name] Dysrhythmia    AFib  . GERD (gastroesophageal reflux disease)   . Hypothyroidism     Observations/Objective: Unable to assess patient as I was speaking with her daughter.  Assessment and Plan: 1. Hypotension, unspecified hypotension type - Advised daughter to push oral fluids with patient, schedule a hospital follow-up with PCP, and keep a close eye on her blood pressure and heart rate.  I asked that she keep a log so that it could be brought to the up appointment with her PCP.  We will request hospital records from [facility].   Follow Up Instructions:  I discussed the assessment and treatment plan with the patient. The patient was provided an opportunity to ask questions and all were answered. The patient agreed with the plan and demonstrated an understanding of the instructions.   The  patient was advised to call back or seek an in-person evaluation if the symptoms worsen or if the condition fails to improve as anticipated.  The above assessment and management plan was discussed with the patient. The patient verbalized understanding of and has agreed to the management plan. Patient is aware to call the clinic if symptoms persist or worsen. Patient is aware when to return to the clinic for a follow-up visit. Patient educated on when it is appropriate to go to the emergency department.   Time call ended: 2:49 PM  I provided 13 minutes of non-face-to-face time during this encounter.  Hendricks Limes, MSN, APRN, FNP-C [facility] 11/01/19

## 2019-11-01 NOTE — Telephone Encounter (Signed)
Just released from hospital. Daughter states patient is taking metoprolol 25mg  twice a day. Daughter is concerned do you think this is wise. Also she was seen at [facility] have you seen records or do we need to request them?

## 2019-11-01 NOTE — Telephone Encounter (Signed)
At this point I would go by what the hospital sent her home on.  They saw her most recent vital signs etc. and you should continue with the recommendations that they have made.

## 2019-11-01 NOTE — Telephone Encounter (Signed)
Hospital follow up appointment was scheduled with Elaine Kennedy on 11/08/2019 at 11:25 am.  Records were requested from [facility].

## 2019-11-01 NOTE — Telephone Encounter (Signed)
Patients daughter aware and verbalized understanding. °

## 2019-11-02 ENCOUNTER — Telehealth: Payer: Self-pay | Admitting: Physician Assistant

## 2019-11-02 ENCOUNTER — Ambulatory Visit (INDEPENDENT_AMBULATORY_CARE_PROVIDER_SITE_OTHER): Payer: Medicare Other | Admitting: *Deleted

## 2019-11-02 ENCOUNTER — Ambulatory Visit: Payer: Self-pay | Admitting: Licensed Clinical Social Worker

## 2019-11-02 DIAGNOSIS — Z8679 Personal history of other diseases of the circulatory system: Secondary | ICD-10-CM

## 2019-11-02 DIAGNOSIS — F0391 Unspecified dementia with behavioral disturbance: Secondary | ICD-10-CM | POA: Diagnosis not present

## 2019-11-02 DIAGNOSIS — Z0000 Encounter for general adult medical examination without abnormal findings: Secondary | ICD-10-CM

## 2019-11-02 DIAGNOSIS — I10 Essential (primary) hypertension: Secondary | ICD-10-CM

## 2019-11-02 DIAGNOSIS — M159 Polyosteoarthritis, unspecified: Secondary | ICD-10-CM

## 2019-11-02 DIAGNOSIS — M8949 Other hypertrophic osteoarthropathy, multiple sites: Secondary | ICD-10-CM

## 2019-11-02 DIAGNOSIS — K219 Gastro-esophageal reflux disease without esophagitis: Secondary | ICD-10-CM

## 2019-11-02 NOTE — Chronic Care Management (AMB) (Signed)
  Care Management Note   Elaine Kennedy is a 86 y.o. year old female who is a primary care patient of Terald Sleeper, PA-C. The CM team was consulted for assistance with chronic disease management and care coordination.   I reached out to [employer], daughter, by phone today.    Review of patient status, including review of consultants reports, relevant laboratory and other test results, and collaboration with appropriate care team members and the patient's provider was performed as part of comprehensive patient evaluation and provision of chronic care management services.   Social determinants of health: risk of social isolation; risk of depression; risk of physical inactivity    Chronic Care Management from 09/08/2019 in [place] [facility]  PHQ-9 Total Score  7     GAD 7 : Generalized Anxiety Score 09/08/2019  Nervous, Anxious, on Edge 1  Control/stop worrying 1  Worry too much - different things 1  Trouble relaxing 1  Restless 0  Easily annoyed or irritable 0  Afraid - awful might happen 1  Total GAD 7 Score 5  Anxiety Difficulty Somewhat difficult   Medications   (very important)  New medications from outside sources are available for reconciliation  acetaminophen (TYLENOL) 650 MG CR tablet aspirin EC 81 MG tablet Baclofen 5 MG TABS cholecalciferol (VITAMIN D) 1000 units tablet ferrous sulfate 325 (65 FE) MG tablet hydrochlorothiazide (HYDRODIURIL) 12.5 MG tablet levothyroxine (SYNTHROID) 88 MCG tablet metoprolol succinate (TOPROL-XL) 50 MG 24 hr tablet Multiple Vitamins-Minerals (MULTIVITAMIN WITH MINERALS) tablet omeprazole (PRILOSEC) 40 MG capsule Polyethylene Glycol 400 (BLINK TEARS   Goals        . Client wants to talk with LCSW about her anxiety/stress related to fall potential (pt-stated)     Current Barriers:  . At risk for falls . Decreased appetite . Pain issues faced in client with Chronic Diagnoses of  HTN, GERD, OA, and  Atrial Fibrillation . Tremor in her hands  Clinical Social Work Clinical Goal(s):  [name] LCSW will talk with clinet and with [name] in next 4 week to discuss client anxiety or stress related to fall potential  Interventions: . Talked with August Saucer about CCM program services . Encouraged August Saucer or client to call RNCM to discuss nurisng support for client . Talked with August Saucer about upcoming client appointments . Talked with August Saucer about pain issues of client . Talked with August Saucer about appetite of client . Talked with August Saucer about ambulation issues of client (client uses a walker as needed)  Talked with August Saucer about physical therapy sessions received by client  LCSW talked with August Saucer about blood pressure issues of client  Patient Self Care Activities:  . Attends all scheduled provider appointments  Patient Self Care Deficits  . Unable to self administer medications as prescribed . Needs help with ADLs . Need transport help  Initial Care Plan documentation    Follow Up Plan:LCSW to call client or granddaughter of client in next 4 weeks to talk about anxiety/stress issues of client related to fall potential  [name].Ikesha Siller MSW, LCSW Licensed Clinical Social Worker [facility] Care Management [phone]

## 2019-11-02 NOTE — Patient Instructions (Addendum)
Licensed Clinical Social Worker Visit Information  Goals we discussed today:  Goals        . Client wants to talk with LCSW about her anxiety/stress related to fall potential (pt-stated)     Current Barriers:  . At risk for falls . Decreased appetite . Pain issues faced in client with Chronic Diagnoses of  HTN, GERD, OA, and Atrial Fibrillation . Tremor in her hands  Clinical Social Work Clinical Goal(s):  [name] LCSW will talk with clinet and with [name] in next 4 week to discuss client anxiety or stress related to fall potential  Interventions: Talked with August Saucer about CCM program services  Encouraged August Saucer or client to call RNCM to discuss nurisng support for client  Talked with August Saucer about upcoming client appointments  Talked with August Saucer about pain issues of client  Talked with August Saucer about appetite of client  Talked with August Saucer about ambulation issues of client (client uses a walker as needed) Talked with August Saucer about physical therapy sessions received by client  LCSW talked with August Saucer about blood pressure issues of client  Patient Self Care Activities:  . Attends all scheduled provider appointments  Patient Self Care Deficits . Unable to self administer medications as prescribed . Needs help with ADLs . Need transport help  Initial Care Plan documentation       Materials Provided: No  Follow Up Plan:LCSW to call client or daughter of client in next 4 weeks to talk about anxiety/stress issues of client related to fall potential  The patient /Elaine Kennedy, daughter, verbalized understanding of instructions provided today and declined a print copy of patient instruction materials.   [name].Donovin Kraemer MSW, LCSW Licensed Clinical Social Worker [facility] Care Management [phone]

## 2019-11-02 NOTE — Patient Instructions (Signed)
Preventive Care 38 Years and Older, Female Preventive care refers to lifestyle choices and visits with your health care provider that can promote health and wellness. This includes:  A yearly physical exam. This is also called an annual well check.  Regular dental and eye exams.  Immunizations.  Screening for certain conditions.  Healthy lifestyle choices, such as diet and exercise. What can I expect for my preventive care visit? Physical exam Your health care provider will check:  Height and weight. These may be used to calculate body mass index (BMI), which is a measurement that tells if you are at a healthy weight.  Heart rate and blood pressure.  Your skin for abnormal spots. Counseling Your health care provider may ask you questions about:  Alcohol, tobacco, and drug use.  Emotional well-being.  Home and relationship well-being.  Sexual activity.  Eating habits.  History of falls.  Memory and ability to understand (cognition).  Work and work Statistician.  Pregnancy and menstrual history. What immunizations do I need?  Influenza (flu) vaccine  This is recommended every year. Tetanus, diphtheria, and pertussis (Tdap) vaccine  You may need a Td booster every 10 years. Varicella (chickenpox) vaccine  You may need this vaccine if you have not already been vaccinated. Zoster (shingles) vaccine  You may need this after age 33. Pneumococcal conjugate (PCV13) vaccine  One dose is recommended after age 33. Pneumococcal polysaccharide (PPSV23) vaccine  One dose is recommended after age 72. Measles, mumps, and rubella (MMR) vaccine  You may need at least one dose of MMR if you were born in 1957 or later. You may also need a second dose. Meningococcal conjugate (MenACWY) vaccine  You may need this if you have certain conditions. Hepatitis A vaccine  You may need this if you have certain conditions or if you travel or work in places where you may be exposed  to hepatitis A. Hepatitis B vaccine  You may need this if you have certain conditions or if you travel or work in places where you may be exposed to hepatitis B. Haemophilus influenzae type b (Hib) vaccine  You may need this if you have certain conditions. You may receive vaccines as individual doses or as more than one vaccine together in one shot (combination vaccines). Talk with your health care provider about the risks and benefits of combination vaccines. What tests do I need? Blood tests  Lipid and cholesterol levels. These may be checked every 5 years, or more frequently depending on your overall health.  Hepatitis C test.  Hepatitis B test. Screening  Lung cancer screening. You may have this screening every year starting at age 39 if you have a 30-pack-year history of smoking and currently smoke or have quit within the past 15 years.  Colorectal cancer screening. All adults should have this screening starting at age 36 and continuing until age 15. Your health care provider may recommend screening at age 23 if you are at increased risk. You will have tests every 1-10 years, depending on your results and the type of screening test.  Diabetes screening. This is done by checking your blood sugar (glucose) after you have not eaten for a while (fasting). You may have this done every 1-3 years.  Mammogram. This may be done every 1-2 years. Talk with your health care provider about how often you should have regular mammograms.  BRCA-related cancer screening. This may be done if you have a family history of breast, ovarian, tubal, or peritoneal cancers.  Other tests  Sexually transmitted disease (STD) testing.  Bone density scan. This is done to screen for osteoporosis. You may have this done starting at age 44. Follow these instructions at home: Eating and drinking  Eat a diet that includes fresh fruits and vegetables, whole grains, lean protein, and low-fat dairy products. Limit  your intake of foods with high amounts of sugar, saturated fats, and salt.  Take vitamin and mineral supplements as recommended by your health care provider.  Do not drink alcohol if your health care provider tells you not to drink.  If you drink alcohol: ? Limit how much you have to 0-1 drink a day. ? Be aware of how much alcohol is in your drink. In the [place] one drink equals one 12 oz bottle of beer (355 mL), one 5 oz glass of wine (148 mL), or one 1 oz glass of hard liquor (44 mL). Lifestyle  Take daily care of your teeth and gums.  Stay active. Exercise for at least 30 minutes on 5 or more days each week.  Do not use any products that contain nicotine or tobacco, such as cigarettes, e-cigarettes, and chewing tobacco. If you need help quitting, ask your health care provider.  If you are sexually active, practice safe sex. Use a condom or other form of protection in order to prevent STIs (sexually transmitted infections).  Talk with your health care provider about taking a low-dose aspirin or statin. What's next?  Go to your health care provider once a year for a well check visit.  Ask your health care provider how often you should have your eyes and teeth checked.  Stay up to date on all vaccines. This information is not intended to replace advice given to you by your health care provider. Make sure you discuss any questions you have with your health care provider. Document Revised: 07/28/2018 Document Reviewed: 07/28/2018 Elsevier Patient Education  2020 [employer].

## 2019-11-02 NOTE — Telephone Encounter (Signed)
[facility]  - there over the weekend - came home TUES.   Gave Cipro for UTI - can we change to something else Aware to stop Cipro in the mean time   [facility]

## 2019-11-02 NOTE — Telephone Encounter (Signed)
If she has increased confusion, she needs to be reevaluate as she could have developed urosepsis. If she can not be seen in office she should go back to ED if she has AMS.

## 2019-11-02 NOTE — Progress Notes (Signed)
MEDICARE ANNUAL WELLNESS VISIT  11/02/2019  Telephone Visit Disclaimer This Medicare AWV was conducted by telephone due to national recommendations for restrictions regarding the COVID-19 Pandemic (e.g. social distancing).  I verified, using two identifiers, that I am speaking with Elaine Kennedy or their authorized healthcare agent. I discussed the limitations, risks, security, and privacy concerns of performing an evaluation and management service by telephone and the potential availability of an in-person appointment in the future. The patient expressed understanding and agreed to proceed.   Subjective:  Elaine Kennedy is a 86 y.o. female patient of Terald Sleeper, PA-C who had a Medicare Annual Wellness Visit today via telephone. Elaine Kennedy is Retired and lives with Systems analyst. she has 2 children. she reports that she is socially active and does interact with friends/family regularly. she is minimally physically active and enjoys watching Mathis Bud on TV.  Patient Care Team: Theodoro Clock as PCP - General (Physician Assistant) Shea Evans, [name], LCSW as Social Worker (Licensed Clinical Social Worker) Ilean China, RN as Case Manager  Advanced Directives 11/02/2019 02/27/2019 06/28/2017 06/23/2017  Does Patient Have a Medical Advance Directive? No No No No  Would patient like information on creating a medical advance directive? No - Patient declined Yes (Inpatient - patient defers creating a medical advance directive at this time - Information given) No - Patient declined No - Patient declined    Hospital Utilization Over the Past 12 Months: # of hospitalizations or ER visits: 2 # of surgeries: 0  Review of Systems    Patient reports that her overall health is unchanged compared to last year.  History obtained from chart review  Patient Reported Readings (BP, Pulse, CBG, Weight, etc) none  Pain Assessment Pain : No/denies pain     Current Medications &  Allergies (verified) Allergies as of 11/02/2019      Reactions   Sulfa Antibiotics Diarrhea      Medication List       Accurate as of November 02, 2019 11:27 AM. If you have any questions, ask your nurse or doctor.        STOP taking these medications   BLINK TEARS OP   hydrochlorothiazide 12.5 MG tablet Commonly known as: HYDRODIURIL     TAKE these medications   acetaminophen 650 MG CR tablet Commonly known as: TYLENOL Take 650 mg by mouth every 8 (eight) hours as needed for pain.   aspirin EC 81 MG tablet Take 81 mg by mouth daily.   Baclofen 5 MG Tabs Take 5 mg by mouth 3 (three) times daily as needed.   cholecalciferol 1000 units tablet Commonly known as: VITAMIN D Take 1,000 Units by mouth daily.   ciprofloxacin 500 MG tablet Commonly known as: CIPRO Take 500 mg by mouth 2 (two) times daily.   ferrous sulfate 325 (65 FE) MG tablet Take 325 mg by mouth daily with breakfast.   levothyroxine 88 MCG tablet Commonly known as: SYNTHROID TAKE ONE TABLET (88 MCG TOTAL) BY MOUTH DAILY.   metoprolol succinate 50 MG 24 hr tablet Commonly known as: TOPROL-XL TAKE 1 TABLET BY MOUTH EVERY DAY   multivitamin with minerals tablet Take 1 tablet by mouth daily. Spectra-vite   omeprazole 40 MG capsule Commonly known as: PRILOSEC TAKE 1 CAPSULE BY MOUTH EVERY DAY       History (reviewed): Past Medical History:  Diagnosis Date  . Arthritis   . Cancer ([place])    skin cancer on  head.  . Dysrhythmia    AFib  . GERD (gastroesophageal reflux disease)   . Hypothyroidism    Past Surgical History:  Procedure Laterality Date  . CATARACT EXTRACTION W/PHACO Left 06/28/2017   Procedure: CATARACT EXTRACTION PHACO AND INTRAOCULAR LENS PLACEMENT (IOC);  Surgeon: Tonny Branch, MD;  Location: [facility];  Service: Ophthalmology;  Laterality: Left;  CDE: 8.46  . CATARACT EXTRACTION W/PHACO Right 02/27/2019   Procedure: CATARACT EXTRACTION PHACO AND INTRAOCULAR LENS PLACEMENT (IOC);   Surgeon: Baruch Goldmann, MD;  Location: [facility];  Service: Ophthalmology;  Laterality: Right;  CDE: 10.07  . TOTAL HIP ARTHROPLASTY Left    Family History  Problem Relation Age of Onset  . Cancer Daughter        breast  . Heart murmur Daughter    Social History   Socioeconomic History  . Marital status: Widowed    Spouse name: Not on file  . Number of children: 2  . Years of education: 8  . Highest education level: 8th grade  Occupational History  . Occupation: retired  Tobacco Use  . Smoking status: Never Smoker  . Smokeless tobacco: Never Used  Substance and Sexual Activity  . Alcohol use: No  . Drug use: No  . Sexual activity: Not Currently    Birth control/protection: Post-menopausal  Other Topics Concern  . Not on file  Social History Narrative  . Not on file   Social Determinants of Health   Financial Resource Strain: Low Risk   . Difficulty of Paying Living Expenses: Not hard at all  Food Insecurity: No Food Insecurity  . Worried About Charity fundraiser in the Last Year: Never true  . Ran Out of Food in the Last Year: Never true  Transportation Needs: No Transportation Needs  . Lack of Transportation (Medical): No  . Lack of Transportation (Non-Medical): No  Physical Activity: Inactive  . Days of Exercise per Week: 0 days  . Minutes of Exercise per Session: 0 min  Stress: No Stress Concern Present  . Feeling of Stress : Only a little  Social Connections: Slightly Isolated  . Frequency of Communication with Friends and Family: More than three times a week  . Frequency of Social Gatherings with Friends and Family: More than three times a week  . Attends Religious Services: More than 4 times per year  . Active Member of Clubs or Organizations: Yes  . Attends Archivist Meetings: More than 4 times per year  . Marital Status: Widowed    Activities of Daily Living In your present state of health, do you have any difficulty performing the following  activities: 11/02/2019 02/27/2019  Hearing? N -  Vision? N -  Comment wears glasses-gets yearly eye exam -  Difficulty concentrating or making decisions? Y -  Comment family lives with her to help care for her and her memory isn't as good as it used to be -  Walking or climbing stairs? Y -  Comment due to general weakness and uses a walker at all times when ambulating -  Dressing or bathing? Y -  Comment her family has to help her with dressing and bathing -  Doing errands, shopping? Tempie Donning  Comment her family takes her to all appointments and to do all errands -  Conservation officer, nature and eating ? Y -  Comment her family prepares all of her meals for her -  Using the Toilet? Y -  Comment her family helps her with toileting -  In the past six months, have you accidently leaked urine? Y -  Comment wears depends at all times -  Do you have problems with loss of bowel control? N -  Managing your Medications? Y -  Comment family manages and gives her medications to her -  Managing your Finances? Y -  Comment family manages all the finances -  Housekeeping or managing your Housekeeping? Y -  Comment family does all the housekeeping -  Some recent data might be hidden    Patient Education/ Literacy How often do you need to have someone help you when you read instructions, pamphlets, or other written materials from your doctor or pharmacy?: 4 - Often What is the last grade level you completed in school?: 8th grade  Exercise Current Exercise Habits: The patient does not participate in regular exercise at present, Exercise limited by: orthopedic condition(s)  Diet Patient reports consuming 3 meals a day and 2 snack(s) a day Patient reports that her primary diet is: Regular Patient reports that she does have regular access to food.   Depression Screen PHQ 2/9 Scores 11/02/2019 10/12/2019 09/19/2019 09/08/2019 09/04/2019 06/19/2019 06/16/2019  PHQ - 2 Score 3 - - 2 0 0 0  PHQ- 9 Score 7 - - 7 - - -    Exception Documentation - Other- indicate reason in comment box Other- indicate reason in comment box - - - -  Not completed - Patient is unable - - - - -     Fall Risk Fall Risk  11/02/2019 10/12/2019 09/19/2019 09/04/2019 06/19/2019  Falls in the past year? 1 1 1 1 1   Number falls in past yr: 1 1 1 1 1   Injury with Fall? 1 1 1 1 1   Comment - - Skin lacerations, contusion to head Skin tears Lump on head  Risk for fall due to : Impaired balance/gait Impaired mobility;Impaired balance/gait - - Impaired balance/gait;Mental status change  Follow up - Falls prevention discussed - - Falls prevention discussed     Objective:  Elaine Kennedy seemed alert and oriented and she participated appropriately during our telephone visit.  Blood Pressure Weight BMI  BP Readings from Last 3 Encounters:  10/12/19 (!) 152/96  09/19/19 (!) 166/81  09/04/19 128/75   Wt Readings from Last 3 Encounters:  07/26/19 138 lb (62.6 kg)  06/19/19 142 lb (64.4 kg)  05/16/19 146 lb 6.4 oz (66.4 kg)   BMI Readings from Last 1 Encounters:  10/12/19 24.45 kg/m    *Unable to obtain current vital signs, weight, and BMI due to telephone visit type  Hearing/Vision  . Bronwyn did not seem to have difficulty with hearing/understanding during the telephone conversation . Reports that she has had a formal eye exam by an eye care professional within the past year . Reports that she has not had a formal hearing evaluation within the past year *Unable to fully assess hearing and vision during telephone visit type  Cognitive Function: No flowsheet data found. (Normal:0-7, Significant for Dysfunction: >8)  Normal Cognitive Function Screening: N/a-pt refused to do Cognitive screening   Immunization & Health Maintenance Record Immunization History  Administered Date(s) Administered  . Fluad Quad(high Dose 65+) 06/16/2019  . Influenza-Unspecified 06/14/2014, 05/27/2015, 06/17/2016, 05/27/2017  . Pneumococcal Conjugate-13  04/08/2015  . Pneumococcal Polysaccharide-23 09/14/2016  . Tdap 09/10/2014    Health Maintenance  Topic Date Due  . DEXA SCAN  Never done  . URINE MICROALBUMIN  09/18/2020  . TETANUS/TDAP  09/10/2024  .  INFLUENZA VACCINE  Completed  . PNA vac Low Risk Adult  Completed       Assessment  This is a routine wellness examination for NAKEA KROLICK.  Health Maintenance: Due or Overdue Health Maintenance Due  Topic Date Due  . DEXA SCAN  Never done    Elaine Kennedy does not need a referral for Community Assistance: Care Management:   Enrolled currently-Kristen Hudy Social Work:    Enrolled currently-Michael Forrest Prescription Assistance:  no Nutrition/Diabetes Education:  no   Plan:  Personalized Goals Goals Addressed            This Visit's Progress   . DIET - INCREASE WATER INTAKE       Try to drink 6-8 glasses of water daily      Personalized Health Maintenance & Screening Recommendations  Shingrix vaccine  Lung Cancer Screening Recommended: no (Low Dose CT Chest recommended if Age 34-80 years, 30 pack-year currently smoking OR have quit w/in past 15 years) Hepatitis C Screening recommended: no HIV Screening recommended: no  Advanced Directives: Written information was not prepared per patient's request.  Referrals & Orders No orders of the defined types were placed in this encounter.   Follow-up Plan . Follow-up with Terald Sleeper, PA-C as planned   I have personally reviewed and noted the following in the patient's chart:   . Medical and social history . Use of alcohol, tobacco or illicit drugs  . Current medications and supplements . Functional ability and status . Nutritional status . Physical activity . Advanced directives . List of other physicians . Hospitalizations, surgeries, and ER visits in previous 12 months . Vitals . Screenings to include cognitive, depression, and falls . Referrals and appointments  In addition, I have reviewed  and discussed with Elaine Kennedy certain preventive protocols, quality metrics, and best practice recommendations. A written personalized care plan for preventive services as well as general preventive health recommendations is available and can be mailed to the patient at her request.      Milas Hock, LPN  [phone]

## 2019-11-02 NOTE — Telephone Encounter (Signed)
I explained to Ms. Cox that patient needs to either be seen here in our office or taken to the emergency room to be evaluated.  I offered her an appointment in the morning with Hendricks Limes but she said she cannot bring her in she is too weak.  I advised her to call EMS and have her transported to the emergency room.  She was very hesitant to do anything.  I explained to her how dangerous it is and how important it is to have her mother evaluated as quickly as possible and again I advised her to contact EMS and have her mother transported to the hospital.

## 2019-11-02 NOTE — Telephone Encounter (Signed)
Pts daughter called to let Elaine Kennedy know that the doctor at the hospital prescribed pt Ciprofloxacin for UTI and says since pt has been taking it, she has been complaining about hip pain and stiffness, and says pt acts like she is out of it.

## 2019-11-03 ENCOUNTER — Other Ambulatory Visit: Payer: Self-pay

## 2019-11-03 ENCOUNTER — Ambulatory Visit (INDEPENDENT_AMBULATORY_CARE_PROVIDER_SITE_OTHER): Payer: Medicare Other | Admitting: Family Medicine

## 2019-11-03 ENCOUNTER — Encounter: Payer: Self-pay | Admitting: Family Medicine

## 2019-11-03 VITALS — BP 104/61 | HR 85 | Temp 97.5°F

## 2019-11-03 DIAGNOSIS — N390 Urinary tract infection, site not specified: Secondary | ICD-10-CM | POA: Diagnosis not present

## 2019-11-03 DIAGNOSIS — I959 Hypotension, unspecified: Secondary | ICD-10-CM

## 2019-11-03 LAB — URINALYSIS, COMPLETE
Bilirubin, UA: NEGATIVE
Glucose, UA: NEGATIVE
Ketones, UA: NEGATIVE
Leukocytes,UA: NEGATIVE
Nitrite, UA: NEGATIVE
RBC, UA: NEGATIVE
Specific Gravity, UA: 1.025 (ref 1.005–1.030)
Urobilinogen, Ur: 0.2 mg/dL (ref 0.2–1.0)
pH, UA: 5.5 (ref 5.0–7.5)

## 2019-11-03 LAB — MICROSCOPIC EXAMINATION
RBC, Urine: NONE SEEN /hpf (ref 0–2)
Renal Epithel, UA: NONE SEEN /hpf

## 2019-11-03 NOTE — Progress Notes (Signed)
Assessment & Plan:  1. Hypotension, unspecified hypotension type - Continue to push fluids. Advised patient to stop Cipro. I am requesting hospital records from [facility] for more information.   2. Recurrent UTI - urinalysis- dip and micro: Urine dipstick shows positive for protein.  Micro exam: 0-5 WBC's per HPF and few bacteria. - Urine culture   Follow up plan: Return if symptoms worsen or fail to improve.  Hendricks Limes, MSN, APRN, FNP-C [facility]  Subjective:   Patient ID: Elaine Kennedy, female    DOB: [birth year], 86 y.o.   MRN: [id number]  HPI: Elaine Kennedy is a 86 y.o. female presenting on 11/03/2019 for Medication Dose Change (States that the Cipro for UTI is making her bp drop. Requesting a change in medication.  Patient is not able to leave a UA and can bring one back this afternoon.)  Patient is accompanied by her daughter who she is okay with being present.  Her daughter is her primary caregiver as well.  The daughter reports patient had a blood pressure of 70/55 this morning.  She believes the Cipro is what is causing the drop in blood pressure for the patient.  The daughter reports the patient is taking Cipro for a UTI that was diagnosed in the hospital.  Daughter reports patient is drinking throughout the day as they are pushing fluids with her.  She is averaging 5 to 6 cups of either water or Gatorade per day.  She also mentions that the patient is supposed to be on metoprolol twice daily but was only given it once yesterday morning due to her low BP readings.   ROS: Negative unless specifically indicated above in HPI.   Relevant past medical history reviewed and updated as indicated.   Allergies and medications reviewed and updated.   Current Outpatient Medications:  .  acetaminophen (TYLENOL) 650 MG CR tablet, Take 650 mg by mouth every 8 (eight) hours as needed for pain. , Disp: , Rfl:  .  aspirin EC 81 MG tablet, Take 81 mg by  mouth daily., Disp: , Rfl:  .  Baclofen 5 MG TABS, Take 5 mg by mouth 3 (three) times daily as needed., Disp: 30 tablet, Rfl: 0 .  cholecalciferol (VITAMIN D) 1000 units tablet, Take 1,000 Units by mouth daily., Disp: , Rfl:  .  ferrous sulfate 325 (65 FE) MG tablet, Take 325 mg by mouth daily with breakfast., Disp: , Rfl:  .  levothyroxine (SYNTHROID) 88 MCG tablet, TAKE ONE TABLET (88 MCG TOTAL) BY MOUTH DAILY., Disp: 90 tablet, Rfl: 0 .  metoprolol succinate (TOPROL-XL) 50 MG 24 hr tablet, TAKE 1 TABLET BY MOUTH EVERY DAY, Disp: 90 tablet, Rfl: 0 .  Multiple Vitamins-Minerals (MULTIVITAMIN WITH MINERALS) tablet, Take 1 tablet by mouth daily. Spectra-vite, Disp: , Rfl:  .  omeprazole (PRILOSEC) 40 MG capsule, TAKE 1 CAPSULE BY MOUTH EVERY DAY, Disp: 90 capsule, Rfl: 1  Allergies  Allergen Reactions  . Sulfa Antibiotics Diarrhea    Objective:   BP 104/61   Pulse 85   Temp (!) 97.5 F (36.4 C) (Temporal)   SpO2 91%    Physical Exam Vitals reviewed.  Constitutional:      General: She is not in acute distress.    Appearance: Normal appearance. She is not ill-appearing, toxic-appearing or diaphoretic.  HENT:     Head: Normocephalic and atraumatic.  Eyes:     General: No scleral icterus.  Right eye: No discharge.        Left eye: No discharge.     Conjunctiva/sclera: Conjunctivae normal.  Cardiovascular:     Rate and Rhythm: Normal rate and regular rhythm.     Heart sounds: Normal heart sounds. No murmur. No friction rub. No gallop.   Pulmonary:     Effort: Pulmonary effort is normal. No respiratory distress.     Breath sounds: Normal breath sounds. No stridor. No wheezing, rhonchi or rales.  Musculoskeletal:        General: Normal range of motion.     Cervical back: Normal range of motion.  Skin:    General: Skin is warm and dry.     Capillary Refill: Capillary refill takes less than 2 seconds.  Neurological:     General: No focal deficit present.     Mental Status:  She is alert and oriented to person, place, and time. Mental status is at baseline.  Psychiatric:        Mood and Affect: Mood normal.        Behavior: Behavior normal.        Thought Content: Thought content normal.        Judgment: Judgment normal.     Comments: Patient is very quiet - daughter states this is her norm.

## 2019-11-04 ENCOUNTER — Telehealth: Payer: Self-pay | Admitting: Nurse Practitioner

## 2019-11-04 DIAGNOSIS — I4821 Permanent atrial fibrillation: Secondary | ICD-10-CM | POA: Diagnosis not present

## 2019-11-04 DIAGNOSIS — M1990 Unspecified osteoarthritis, unspecified site: Secondary | ICD-10-CM | POA: Diagnosis not present

## 2019-11-04 DIAGNOSIS — E876 Hypokalemia: Secondary | ICD-10-CM | POA: Diagnosis not present

## 2019-11-04 DIAGNOSIS — K219 Gastro-esophageal reflux disease without esophagitis: Secondary | ICD-10-CM | POA: Diagnosis not present

## 2019-11-04 DIAGNOSIS — Z85828 Personal history of other malignant neoplasm of skin: Secondary | ICD-10-CM | POA: Diagnosis not present

## 2019-11-04 DIAGNOSIS — I110 Hypertensive heart disease with heart failure: Secondary | ICD-10-CM | POA: Diagnosis not present

## 2019-11-04 DIAGNOSIS — I509 Heart failure, unspecified: Secondary | ICD-10-CM | POA: Diagnosis not present

## 2019-11-04 DIAGNOSIS — E039 Hypothyroidism, unspecified: Secondary | ICD-10-CM | POA: Diagnosis not present

## 2019-11-04 NOTE — Telephone Encounter (Signed)
Patients daughter called late Friday night to ask about her cipro antibiotic. Elaine Kennedy was in the hospital the first of last this week with what she was told was a bowel infection and UTI. She was given IV cipro and discharged with cipro 500mg  BID for 5 days. Her daughter thinks that cipro was making her feel bad so she brought her in to see B. Joyce,NP Friday. She was told to stop cipro and she would get records from [facility]) and see what er cultures showed. Daughter siad they did not get a call back. I reached out to B. Blanch Media , NP to see if she had received records from [facility]. SHe had them and looked and Bowel culture grew scant amount of staph aeurus  and urine culture really did not grow anything. We decided that she had probably received enough cipro with what she was given IV in hospital and the last 3 days of taking BID, that another antibiotic was not needed.  Daughter called back this morning as asked to. I discussed with her what B. Blanch Media and I discussed. Her daughter said she was actually doing quite well this morning. She had no fever, was alert and oriented and her gait was even better. Her daughter agreed with holding off on another antibiotic. SH was told to contact me if things changed throughout the weekend.

## 2019-11-05 ENCOUNTER — Encounter: Payer: Self-pay | Admitting: Family Medicine

## 2019-11-05 LAB — URINE CULTURE

## 2019-11-08 ENCOUNTER — Ambulatory Visit: Payer: Medicare Other | Admitting: Physician Assistant

## 2019-11-08 DIAGNOSIS — I509 Heart failure, unspecified: Secondary | ICD-10-CM | POA: Diagnosis not present

## 2019-11-08 DIAGNOSIS — I110 Hypertensive heart disease with heart failure: Secondary | ICD-10-CM | POA: Diagnosis not present

## 2019-11-08 DIAGNOSIS — I4821 Permanent atrial fibrillation: Secondary | ICD-10-CM | POA: Diagnosis not present

## 2019-11-08 DIAGNOSIS — K219 Gastro-esophageal reflux disease without esophagitis: Secondary | ICD-10-CM | POA: Diagnosis not present

## 2019-11-08 DIAGNOSIS — Z85828 Personal history of other malignant neoplasm of skin: Secondary | ICD-10-CM | POA: Diagnosis not present

## 2019-11-08 DIAGNOSIS — M1990 Unspecified osteoarthritis, unspecified site: Secondary | ICD-10-CM | POA: Diagnosis not present

## 2019-11-08 DIAGNOSIS — E876 Hypokalemia: Secondary | ICD-10-CM | POA: Diagnosis not present

## 2019-11-08 DIAGNOSIS — E039 Hypothyroidism, unspecified: Secondary | ICD-10-CM | POA: Diagnosis not present

## 2019-11-09 ENCOUNTER — Encounter: Payer: Self-pay | Admitting: Family Medicine

## 2019-11-09 ENCOUNTER — Ambulatory Visit (INDEPENDENT_AMBULATORY_CARE_PROVIDER_SITE_OTHER): Payer: Medicare Other | Admitting: Family Medicine

## 2019-11-09 DIAGNOSIS — I509 Heart failure, unspecified: Secondary | ICD-10-CM | POA: Diagnosis not present

## 2019-11-09 DIAGNOSIS — Z09 Encounter for follow-up examination after completed treatment for conditions other than malignant neoplasm: Secondary | ICD-10-CM | POA: Diagnosis not present

## 2019-11-09 NOTE — Progress Notes (Signed)
Virtual Visit via Telephone Note  I connected with Elaine Kennedy's daughter Elaine Kennedy on 11/09/19 at 8:24 AM by telephone and verified that I am speaking with the correct person using two identifiers. Elaine Kennedy is currently located at home and her daughter is currently with her during this visit. The provider, Loman Brooklyn, FNP is located in their home at time of visit.  I discussed the limitations, risks, security and privacy concerns of performing an evaluation and management service by telephone and the availability of in person appointments. I also discussed with the patient that there may be a patient responsible charge related to this service. The patient expressed understanding and agreed to proceed.  Subjective: PCP: Terald Sleeper, PA-C  Chief Complaint  Patient presents with  . Hospitalization Follow-up   Daughter is wanting to know if patient needs a cardiologist since they mentioned she had acute heart failure in the hospital.   They are monitoring her weight daily and she has been either 126 or 127 lbs over the past four days.   She is eating and drinking well since they are no longer giving her Ensure.   PT is coming out to the home.    ROS: Per HPI  Current Outpatient Medications:  .  acetaminophen (TYLENOL) 650 MG CR tablet, Take 650 mg by mouth every 8 (eight) hours as needed for pain. , Disp: , Rfl:  .  aspirin EC 81 MG tablet, Take 81 mg by mouth daily., Disp: , Rfl:  .  Baclofen 5 MG TABS, Take 5 mg by mouth 3 (three) times daily as needed., Disp: 30 tablet, Rfl: 0 .  cholecalciferol (VITAMIN D) 1000 units tablet, Take 1,000 Units by mouth daily., Disp: , Rfl:  .  ferrous sulfate 325 (65 FE) MG tablet, Take 325 mg by mouth daily with breakfast., Disp: , Rfl:  .  levothyroxine (SYNTHROID) 88 MCG tablet, TAKE ONE TABLET (88 MCG TOTAL) BY MOUTH DAILY., Disp: 90 tablet, Rfl: 0 .  metoprolol succinate (TOPROL-XL) 50 MG 24 hr tablet, TAKE 1 TABLET BY MOUTH EVERY  DAY, Disp: 90 tablet, Rfl: 0 .  Multiple Vitamins-Minerals (MULTIVITAMIN WITH MINERALS) tablet, Take 1 tablet by mouth daily. Spectra-vite, Disp: , Rfl:  .  omeprazole (PRILOSEC) 40 MG capsule, TAKE 1 CAPSULE BY MOUTH EVERY DAY, Disp: 90 capsule, Rfl: 1  Allergies  Allergen Reactions  . Sulfa Antibiotics Diarrhea   Past Medical History:  Diagnosis Date  . Arthritis   . Cancer ([place])    skin cancer on head.  [name] Dysrhythmia    AFib  . GERD (gastroesophageal reflux disease)   . Hypothyroidism     Observations/Objective: Unable to assess patient as I was speaking with daughter.   Assessment and Plan: 1. Hospital discharge follow-up - Reassurance provided to daughter. Discussed heart failure symptoms can be managed as well by PCP. Continue metoprolol. Encouraged patient to eat, drink, and keep moving. Keep working with PT.    Follow Up Instructions: I discussed the assessment and treatment plan with the patient. The patient was provided an opportunity to ask questions and all were answered. The patient agreed with the plan and demonstrated an understanding of the instructions.   The patient was advised to call back or seek an in-person evaluation if the symptoms worsen or if the condition fails to improve as anticipated.  The above assessment and management plan was discussed with the patient. The patient verbalized understanding of and has agreed to the  management plan. Patient is aware to call the clinic if symptoms persist or worsen. Patient is aware when to return to the clinic for a follow-up visit. Patient educated on when it is appropriate to go to the emergency department.   Time call ended: 8:39 AM  I provided 17 minutes of non-face-to-face time during this encounter.  Hendricks Limes, MSN, APRN, FNP-C [facility] 11/09/19

## 2019-11-11 DIAGNOSIS — I509 Heart failure, unspecified: Secondary | ICD-10-CM | POA: Diagnosis not present

## 2019-11-11 DIAGNOSIS — Z85828 Personal history of other malignant neoplasm of skin: Secondary | ICD-10-CM | POA: Diagnosis not present

## 2019-11-11 DIAGNOSIS — I4821 Permanent atrial fibrillation: Secondary | ICD-10-CM | POA: Diagnosis not present

## 2019-11-11 DIAGNOSIS — I110 Hypertensive heart disease with heart failure: Secondary | ICD-10-CM | POA: Diagnosis not present

## 2019-11-11 DIAGNOSIS — M1990 Unspecified osteoarthritis, unspecified site: Secondary | ICD-10-CM | POA: Diagnosis not present

## 2019-11-11 DIAGNOSIS — E876 Hypokalemia: Secondary | ICD-10-CM | POA: Diagnosis not present

## 2019-11-11 DIAGNOSIS — K219 Gastro-esophageal reflux disease without esophagitis: Secondary | ICD-10-CM | POA: Diagnosis not present

## 2019-11-11 DIAGNOSIS — E039 Hypothyroidism, unspecified: Secondary | ICD-10-CM | POA: Diagnosis not present

## 2019-11-13 ENCOUNTER — Telehealth: Payer: Self-pay | Admitting: Physician Assistant

## 2019-11-13 DIAGNOSIS — R2689 Other abnormalities of gait and mobility: Secondary | ICD-10-CM | POA: Diagnosis not present

## 2019-11-13 DIAGNOSIS — K219 Gastro-esophageal reflux disease without esophagitis: Secondary | ICD-10-CM | POA: Diagnosis not present

## 2019-11-13 DIAGNOSIS — E039 Hypothyroidism, unspecified: Secondary | ICD-10-CM | POA: Diagnosis not present

## 2019-11-13 DIAGNOSIS — M1990 Unspecified osteoarthritis, unspecified site: Secondary | ICD-10-CM | POA: Diagnosis not present

## 2019-11-13 DIAGNOSIS — E876 Hypokalemia: Secondary | ICD-10-CM | POA: Diagnosis not present

## 2019-11-13 DIAGNOSIS — Z85828 Personal history of other malignant neoplasm of skin: Secondary | ICD-10-CM | POA: Diagnosis not present

## 2019-11-13 DIAGNOSIS — G20 Parkinson's disease: Secondary | ICD-10-CM | POA: Diagnosis not present

## 2019-11-13 DIAGNOSIS — I4821 Permanent atrial fibrillation: Secondary | ICD-10-CM | POA: Diagnosis not present

## 2019-11-13 DIAGNOSIS — I110 Hypertensive heart disease with heart failure: Secondary | ICD-10-CM | POA: Diagnosis not present

## 2019-11-13 DIAGNOSIS — M130 Polyarthritis, unspecified: Secondary | ICD-10-CM | POA: Diagnosis not present

## 2019-11-13 DIAGNOSIS — I509 Heart failure, unspecified: Secondary | ICD-10-CM | POA: Diagnosis not present

## 2019-11-13 NOTE — Telephone Encounter (Signed)
Not knowing anything else about her except for what I see in her chart, I do see that she has a history of A. fib which puts her at increased risk for stroke and is not on any other kind of blood thinner, so in this case I would say to continue to take it because she is not on any other blood thinner for stroke prevention.  I think she should definitely follow-up on this with whoever her primary care provider ends up being in our office. Caryl Pina, MD [facility] 11/13/2019, 12:57 PM

## 2019-11-14 DIAGNOSIS — E039 Hypothyroidism, unspecified: Secondary | ICD-10-CM | POA: Diagnosis not present

## 2019-11-14 DIAGNOSIS — I4821 Permanent atrial fibrillation: Secondary | ICD-10-CM | POA: Diagnosis not present

## 2019-11-14 DIAGNOSIS — E876 Hypokalemia: Secondary | ICD-10-CM | POA: Diagnosis not present

## 2019-11-14 DIAGNOSIS — I110 Hypertensive heart disease with heart failure: Secondary | ICD-10-CM | POA: Diagnosis not present

## 2019-11-14 DIAGNOSIS — K219 Gastro-esophageal reflux disease without esophagitis: Secondary | ICD-10-CM | POA: Diagnosis not present

## 2019-11-14 DIAGNOSIS — M1990 Unspecified osteoarthritis, unspecified site: Secondary | ICD-10-CM | POA: Diagnosis not present

## 2019-11-14 DIAGNOSIS — I509 Heart failure, unspecified: Secondary | ICD-10-CM | POA: Diagnosis not present

## 2019-11-14 DIAGNOSIS — Z85828 Personal history of other malignant neoplasm of skin: Secondary | ICD-10-CM | POA: Diagnosis not present

## 2019-11-15 ENCOUNTER — Telehealth: Payer: Self-pay | Admitting: Family Medicine

## 2019-11-15 NOTE — Telephone Encounter (Signed)
Daughter aware and verbalized understanding

## 2019-11-15 NOTE — Telephone Encounter (Signed)
Yes. I wasn't aware she wasn't taking it.

## 2019-11-16 DIAGNOSIS — M79676 Pain in unspecified toe(s): Secondary | ICD-10-CM | POA: Diagnosis not present

## 2019-11-16 DIAGNOSIS — B351 Tinea unguium: Secondary | ICD-10-CM | POA: Diagnosis not present

## 2019-11-16 DIAGNOSIS — I70203 Unspecified atherosclerosis of native arteries of extremities, bilateral legs: Secondary | ICD-10-CM | POA: Diagnosis not present

## 2019-11-16 DIAGNOSIS — L84 Corns and callosities: Secondary | ICD-10-CM | POA: Diagnosis not present

## 2019-11-16 DIAGNOSIS — M6281 Muscle weakness (generalized): Secondary | ICD-10-CM | POA: Diagnosis not present

## 2019-11-16 DIAGNOSIS — Z9181 History of falling: Secondary | ICD-10-CM | POA: Diagnosis not present

## 2019-11-16 NOTE — Telephone Encounter (Signed)
Aware of provider advice.

## 2019-11-17 DIAGNOSIS — I4821 Permanent atrial fibrillation: Secondary | ICD-10-CM | POA: Diagnosis not present

## 2019-11-17 DIAGNOSIS — Z85828 Personal history of other malignant neoplasm of skin: Secondary | ICD-10-CM | POA: Diagnosis not present

## 2019-11-17 DIAGNOSIS — E039 Hypothyroidism, unspecified: Secondary | ICD-10-CM | POA: Diagnosis not present

## 2019-11-17 DIAGNOSIS — E876 Hypokalemia: Secondary | ICD-10-CM | POA: Diagnosis not present

## 2019-11-17 DIAGNOSIS — I110 Hypertensive heart disease with heart failure: Secondary | ICD-10-CM | POA: Diagnosis not present

## 2019-11-17 DIAGNOSIS — M1990 Unspecified osteoarthritis, unspecified site: Secondary | ICD-10-CM | POA: Diagnosis not present

## 2019-11-17 DIAGNOSIS — K219 Gastro-esophageal reflux disease without esophagitis: Secondary | ICD-10-CM | POA: Diagnosis not present

## 2019-11-17 DIAGNOSIS — I509 Heart failure, unspecified: Secondary | ICD-10-CM | POA: Diagnosis not present

## 2019-11-21 ENCOUNTER — Other Ambulatory Visit: Payer: Medicare Other

## 2019-11-21 DIAGNOSIS — N390 Urinary tract infection, site not specified: Secondary | ICD-10-CM | POA: Diagnosis not present

## 2019-11-21 LAB — URINALYSIS, COMPLETE
Bilirubin, UA: NEGATIVE
Glucose, UA: NEGATIVE
Ketones, UA: NEGATIVE
Leukocytes,UA: NEGATIVE
Nitrite, UA: NEGATIVE
Protein,UA: NEGATIVE
RBC, UA: NEGATIVE
Specific Gravity, UA: 1.025 (ref 1.005–1.030)
Urobilinogen, Ur: 0.2 mg/dL (ref 0.2–1.0)
pH, UA: 6 (ref 5.0–7.5)

## 2019-11-21 LAB — MICROSCOPIC EXAMINATION
Bacteria, UA: NONE SEEN
RBC, Urine: NONE SEEN /hpf (ref 0–2)
Renal Epithel, UA: NONE SEEN /hpf

## 2019-11-22 DIAGNOSIS — E876 Hypokalemia: Secondary | ICD-10-CM | POA: Diagnosis not present

## 2019-11-22 DIAGNOSIS — K219 Gastro-esophageal reflux disease without esophagitis: Secondary | ICD-10-CM | POA: Diagnosis not present

## 2019-11-22 DIAGNOSIS — M1990 Unspecified osteoarthritis, unspecified site: Secondary | ICD-10-CM | POA: Diagnosis not present

## 2019-11-22 DIAGNOSIS — E039 Hypothyroidism, unspecified: Secondary | ICD-10-CM | POA: Diagnosis not present

## 2019-11-22 DIAGNOSIS — I4821 Permanent atrial fibrillation: Secondary | ICD-10-CM | POA: Diagnosis not present

## 2019-11-22 DIAGNOSIS — I509 Heart failure, unspecified: Secondary | ICD-10-CM | POA: Diagnosis not present

## 2019-11-22 DIAGNOSIS — I110 Hypertensive heart disease with heart failure: Secondary | ICD-10-CM | POA: Diagnosis not present

## 2019-11-22 DIAGNOSIS — Z85828 Personal history of other malignant neoplasm of skin: Secondary | ICD-10-CM | POA: Diagnosis not present

## 2019-11-23 LAB — URINE CULTURE

## 2019-11-28 ENCOUNTER — Ambulatory Visit (INDEPENDENT_AMBULATORY_CARE_PROVIDER_SITE_OTHER): Payer: Medicare Other | Admitting: Licensed Clinical Social Worker

## 2019-11-28 DIAGNOSIS — I10 Essential (primary) hypertension: Secondary | ICD-10-CM

## 2019-11-28 DIAGNOSIS — Z8679 Personal history of other diseases of the circulatory system: Secondary | ICD-10-CM

## 2019-11-28 DIAGNOSIS — M159 Polyosteoarthritis, unspecified: Secondary | ICD-10-CM

## 2019-11-28 DIAGNOSIS — K219 Gastro-esophageal reflux disease without esophagitis: Secondary | ICD-10-CM

## 2019-11-28 NOTE — Chronic Care Management (AMB) (Signed)
Chronic Care Management    Clinical Social Work Follow Up Note  11/28/2019 Name: Elaine Kennedy MRN: [id number] DOB: 25-Mar-1927  Elaine Kennedy is a 86 y.o. year old female who is a primary care patient of Elaine Norlander, DO. The CCM team was consulted for assistance with Level of Care Concerns.   Review of patient status, including review of consultants reports, other relevant assessments, and collaboration with appropriate care team members and the patient's provider was performed as part of comprehensive patient evaluation and provision of chronic care management services.    SDOH (Social Determinants of Health) assessments performed: Yes; risk of social isolation; risk of physical inactivity    Clinical Support from 11/02/2019 in [place]  PHQ-9 Total Score  7     GAD 7 : Generalized Anxiety Score 09/08/2019  Nervous, Anxious, on Edge 1  Control/stop worrying 1  Worry too much - different things 1  Trouble relaxing 1  Restless 0  Easily annoyed or irritable 0  Afraid - awful might happen 1  Total GAD 7 Score 5  Anxiety Difficulty Somewhat difficult    Outpatient Encounter Medications as of 11/28/2019  Medication Sig Note  . acetaminophen (TYLENOL) 650 MG CR tablet Take 650 mg by mouth every 8 (eight) hours as needed for pain.    Elaine Kennedy Kitchen aspirin EC 81 MG tablet Take 81 mg by mouth daily.   . Baclofen 5 MG TABS Take 5 mg by mouth 3 (three) times daily as needed.   . cholecalciferol (VITAMIN D) 1000 units tablet Take 1,000 Units by mouth daily.   . ferrous sulfate 325 (65 FE) MG tablet Take 325 mg by mouth daily with breakfast.   . levothyroxine (SYNTHROID) 88 MCG tablet TAKE ONE TABLET (88 MCG TOTAL) BY MOUTH DAILY.   . metoprolol succinate (TOPROL-XL) 50 MG 24 hr tablet TAKE 1 TABLET BY MOUTH EVERY DAY 11/02/2019: Takes 25 mg bid  . Multiple Vitamins-Minerals (MULTIVITAMIN WITH MINERALS) tablet Take 1 tablet by mouth daily. Spectra-vite   . omeprazole  (PRILOSEC) 40 MG capsule TAKE 1 CAPSULE BY MOUTH EVERY DAY    No facility-administered encounter medications on file as of 11/28/2019.    Goals    . Client wants to talk with LCSW about her anxiety/stress related to fall potential (pt-stated)     Current Barriers:  . At risk for falls . Decreased appetite . Pain issues faced in client with Chronic Diagnoses of  HTN, GERD, OA, and Atrial Fibrillation . Tremor in her hands  Clinical Social Work Clinical Goal(s):  Elaine Kennedy Kitchen LCSW will talk with clinet and with Elaine Kennedy in next 4 week to discuss client anxiety or stress related to fall potential  Interventions:  Talked with Elaine Kennedy, granddaughter of client about CCM program services  Encouraged  that Elaine Kennedy or Elaine Kennedy call RNCM to discuss nurisng support for client  Talked with Elaine Kennedy about upcoming client appointments  Talked with Elaine Kennedy about pain issues of client  Talked with Elaine Kennedy about appetite of client  Talked with Elaine Kennedy about ambulation issues of client (client uses a walker as needed)  Talked with Elaine Kennedy about physical therapy sessions received by client  LCSW talked with Elaine Kennedy about blood pressure issues of client   Talked with Elaine Kennedy about sleeping challenges of client  Talked with Elaine Kennedy about client's upcoming appointment in May of 2021 with Elaine Kennedy, neurologist.  Patient Self Care Activities:  . Attends all scheduled provider appointments  Patient Self Care  Deficits . Unable to self administer medications as prescribed . Needs help with ADLs . Need transport help  Initial Care Plan documentation     Follow Up Plan:LCSW to call client or granddaughter of client in next 4 weeks to talk about anxiety/stress issues of client related to fall potential  [name].Yoon Barca MSW, LCSW Licensed Clinical Social Worker [facility] [phone]

## 2019-11-28 NOTE — Patient Instructions (Addendum)
Licensed Clinical Social Worker Visit Information  Goals we discussed today:  Goals        . Client wants to talk with LCSW about her anxiety/stress related to fall potential (pt-stated)     Current Barriers:  . At risk for falls . Decreased appetite . Pain issues faced in client with Chronic Diagnoses of  HTN, GERD, OA, and Atrial Fibrillation . Tremor in her hands  Clinical Social Work Clinical Goal(s):  [name] LCSW will talk with clinet and with Elaine Kennedy in next 4 week to discuss client anxiety or stress related to fall potential  Interventions: Talked with Elaine Kennedy, granddaughter of client about CCM program services  Encouraged that Elaine Kennedy or Elaine Kennedy call RNCM to discuss nurisng support for client  Talked with Elaine Kennedy about upcoming client appointments  Talked with Elaine Kennedy about pain issues of client  Talked with Elaine Kennedy about appetite of client  Talked with Elaine Kennedy about ambulation issues of client (client uses a walker as needed) Talked with Elaine Kennedy about physical therapy sessions received by client  LCSW talked with Elaine Kennedy about blood pressure issues of client  Talked with Elaine/client about sleeping challenges of client  Talked with Elaine Kennedy about client's upcoming appointment in May of 2021 with Dr. Merlene Laughter, neurologist.  Patient Self Care Activities:  . Attends all scheduled provider appointments  Patient Self Care Deficits . Unable to self administer medications as prescribed . Needs help with ADLs . Need transport help  Initial Care Plan documentation      Materials Provided: No  Follow Up Plan:LCSW to call client or granddaughter of client in next 4 weeks to talk about anxiety/stress issues of client related to fall potential  The patient /Elaine Kennedy , granddaughter,verbalized understanding of instructions provided today and declined a print copy of patient instruction materials.   [name].Quiara Killian MSW, LCSW Licensed Clinical Social Worker [facility] [phone]

## 2019-11-29 ENCOUNTER — Telehealth: Payer: Self-pay | Admitting: *Deleted

## 2019-11-29 DIAGNOSIS — M1990 Unspecified osteoarthritis, unspecified site: Secondary | ICD-10-CM | POA: Diagnosis not present

## 2019-11-29 DIAGNOSIS — I509 Heart failure, unspecified: Secondary | ICD-10-CM | POA: Diagnosis not present

## 2019-11-29 DIAGNOSIS — K219 Gastro-esophageal reflux disease without esophagitis: Secondary | ICD-10-CM | POA: Diagnosis not present

## 2019-11-29 DIAGNOSIS — E039 Hypothyroidism, unspecified: Secondary | ICD-10-CM | POA: Diagnosis not present

## 2019-11-29 DIAGNOSIS — I4821 Permanent atrial fibrillation: Secondary | ICD-10-CM | POA: Diagnosis not present

## 2019-11-29 DIAGNOSIS — I110 Hypertensive heart disease with heart failure: Secondary | ICD-10-CM | POA: Diagnosis not present

## 2019-11-29 DIAGNOSIS — Z85828 Personal history of other malignant neoplasm of skin: Secondary | ICD-10-CM | POA: Diagnosis not present

## 2019-11-29 DIAGNOSIS — E876 Hypokalemia: Secondary | ICD-10-CM | POA: Diagnosis not present
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
## 2019-11-29 NOTE — Telephone Encounter (Signed)
Thanks for weighing in on this situation.  I don't know this patient well so I am unable to comment on her concerns.

## 2019-11-30 ENCOUNTER — Other Ambulatory Visit: Payer: Self-pay | Admitting: Family Medicine

## 2019-11-30 ENCOUNTER — Telehealth: Payer: Self-pay

## 2019-11-30 MED ORDER — AMOXICILLIN 500 MG PO CAPS: 500.0000 mg | ORAL_CAPSULE | Freq: Three times a day (TID) | ORAL | 0 refills | Status: DC

## 2019-11-30 NOTE — Telephone Encounter (Signed)
Please contact the patient: The infection should respond to amoxicilllin. I sent that to her pharmacy. Thanks, WS

## 2019-11-30 NOTE — Telephone Encounter (Signed)
Pt's daughter called requesting urine culture results.  Urine culture positive for Enterococcus Faecalis.  Advised daughter that culture did show infection and that Dr. Lajuana Ripple is out of the office today and that another provider would need to evaluate the results for treatment.

## 2019-11-30 NOTE — Telephone Encounter (Signed)
Patient aware and verbalized understanding. °

## 2019-12-01 ENCOUNTER — Other Ambulatory Visit: Payer: Self-pay

## 2019-12-01 ENCOUNTER — Ambulatory Visit (INDEPENDENT_AMBULATORY_CARE_PROVIDER_SITE_OTHER): Payer: Medicare Other | Admitting: Family Medicine

## 2019-12-01 ENCOUNTER — Encounter: Payer: Self-pay | Admitting: Family Medicine

## 2019-12-01 VITALS — BP 128/76 | HR 95

## 2019-12-01 DIAGNOSIS — M25473 Effusion, unspecified ankle: Secondary | ICD-10-CM

## 2019-12-01 DIAGNOSIS — Z7409 Other reduced mobility: Secondary | ICD-10-CM

## 2019-12-01 NOTE — Progress Notes (Signed)
Telephone visit  Subjective: CC: ankle edema PCP: [name], DO [id number] L Graul is a 86 y.o. female calls for telephone consult today. Patient provides verbal consent for consult held via phone.  Due to COVID-19 pandemic this visit was conducted virtually. This visit type was conducted due to national recommendations for restrictions regarding the COVID-19 Pandemic (e.g. social distancing, sheltering in place) in an effort to limit this patient's exposure and mitigate transmission in our community. All issues noted in this document were discussed and addressed.  A physical exam was not performed with this format.   Location of patient: home Location of provider: [facility] Others present for call: Bethena Roys, daughter  1. Ankle edema Her daughter reports about a 4 day history of ankle swelling.  No change in diet.  No excessive salt intake.  She has been sitting more with her legs down. She is not comfortable keeping her legs up.  She has a lift chair.  She uses compression hose some times.  She has a good appetite.  No falls.  ROS: Per HPI  Allergies  Allergen Reactions  . Sulfa Antibiotics Diarrhea   Past Medical History:  Diagnosis Date  . Arthritis   . Cancer ([place])    skin cancer on head.  [name] Dysrhythmia    AFib  . GERD (gastroesophageal reflux disease)   . Hypothyroidism     Current Outpatient Medications:  .  acetaminophen (TYLENOL) 650 MG CR tablet, Take 650 mg by mouth every 8 (eight) hours as needed for pain. , Disp: , Rfl:  .  amoxicillin (AMOXIL) 500 MG capsule, Take 1 capsule (500 mg total) by mouth 3 (three) times daily., Disp: 21 capsule, Rfl: 0 .  aspirin EC 81 MG tablet, Take 81 mg by mouth daily., Disp: , Rfl:  .  Baclofen 5 MG TABS, Take 5 mg by mouth 3 (three) times daily as needed., Disp: 30 tablet, Rfl: 0 .  cholecalciferol (VITAMIN D) 1000 units tablet, Take 1,000 Units by mouth daily., Disp: , Rfl:  .  ferrous sulfate 325 (65 FE) MG tablet, Take 325 mg by  mouth daily with breakfast., Disp: , Rfl:  .  levothyroxine (SYNTHROID) 88 MCG tablet, TAKE ONE TABLET (88 MCG TOTAL) BY MOUTH DAILY., Disp: 90 tablet, Rfl: 0 .  metoprolol succinate (TOPROL-XL) 50 MG 24 hr tablet, TAKE 1 TABLET BY MOUTH EVERY DAY, Disp: 90 tablet, Rfl: 0 .  Multiple Vitamins-Minerals (MULTIVITAMIN WITH MINERALS) tablet, Take 1 tablet by mouth daily. Spectra-vite, Disp: , Rfl:  .  omeprazole (PRILOSEC) 40 MG capsule, TAKE 1 CAPSULE BY MOUTH EVERY DAY, Disp: 90 capsule, Rfl: 1  Blood pressure 128/76, pulse 95, SpO2 100 %.  Assessment/ Plan: 86 y.o. female   1. Ankle edema Recommended nonpharmacologic methods of helping with edema including elevation of legs, salt restriction, compression hose.  I hesitate to add a diuretic given her mild hypokalemia and frailty.  We discussed red flag signs and symptoms warranting further evaluation.  Her daughter voiced good understanding will follow as needed  2. Limited mobility   Start time: 12:49pm End time: 1:01pm  Total time spent on patient care (including telephone call/ virtual visit): 20 minutes  [place], [place] [phone]

## 2019-12-03 ENCOUNTER — Other Ambulatory Visit: Payer: Self-pay | Admitting: Family Medicine

## 2019-12-05 ENCOUNTER — Telehealth: Payer: Self-pay | Admitting: Family Medicine

## 2019-12-06 ENCOUNTER — Telehealth: Payer: Self-pay | Admitting: *Deleted

## 2019-12-06 DIAGNOSIS — I110 Hypertensive heart disease with heart failure: Secondary | ICD-10-CM | POA: Diagnosis not present

## 2019-12-06 DIAGNOSIS — M1990 Unspecified osteoarthritis, unspecified site: Secondary | ICD-10-CM | POA: Diagnosis not present

## 2019-12-06 DIAGNOSIS — Z85828 Personal history of other malignant neoplasm of skin: Secondary | ICD-10-CM | POA: Diagnosis not present

## 2019-12-06 DIAGNOSIS — I4821 Permanent atrial fibrillation: Secondary | ICD-10-CM | POA: Diagnosis not present

## 2019-12-06 DIAGNOSIS — E876 Hypokalemia: Secondary | ICD-10-CM | POA: Diagnosis not present

## 2019-12-06 DIAGNOSIS — K219 Gastro-esophageal reflux disease without esophagitis: Secondary | ICD-10-CM | POA: Diagnosis not present

## 2019-12-06 DIAGNOSIS — E039 Hypothyroidism, unspecified: Secondary | ICD-10-CM | POA: Diagnosis not present

## 2019-12-06 DIAGNOSIS — I509 Heart failure, unspecified: Secondary | ICD-10-CM | POA: Diagnosis not present

## 2019-12-06 NOTE — Telephone Encounter (Signed)
VM from [place] case manager Needs in writing if patient is needing 24 hour supervising

## 2019-12-06 NOTE — Telephone Encounter (Signed)
I have not had the opportunity to meet this patient face-to-face.  From what I discussed with the daughter she sounds like she is requiring quite a bit of assistance and is totally dependent on IADLs and ADLs.  I do think that she likely needs 24-hour care and assistance based on this information but again have not performed a formal physical exam/assessment.

## 2019-12-12 ENCOUNTER — Other Ambulatory Visit: Payer: Medicare Other

## 2019-12-12 ENCOUNTER — Other Ambulatory Visit: Payer: Self-pay

## 2019-12-12 ENCOUNTER — Ambulatory Visit (INDEPENDENT_AMBULATORY_CARE_PROVIDER_SITE_OTHER): Payer: Medicare Other

## 2019-12-12 ENCOUNTER — Other Ambulatory Visit: Payer: Self-pay | Admitting: Family Medicine

## 2019-12-12 DIAGNOSIS — E039 Hypothyroidism, unspecified: Secondary | ICD-10-CM

## 2019-12-12 DIAGNOSIS — N390 Urinary tract infection, site not specified: Secondary | ICD-10-CM | POA: Diagnosis not present

## 2019-12-12 DIAGNOSIS — K219 Gastro-esophageal reflux disease without esophagitis: Secondary | ICD-10-CM | POA: Diagnosis not present

## 2019-12-12 DIAGNOSIS — I110 Hypertensive heart disease with heart failure: Secondary | ICD-10-CM

## 2019-12-12 DIAGNOSIS — R399 Unspecified symptoms and signs involving the genitourinary system: Secondary | ICD-10-CM

## 2019-12-12 DIAGNOSIS — Z85828 Personal history of other malignant neoplasm of skin: Secondary | ICD-10-CM

## 2019-12-12 DIAGNOSIS — F0390 Unspecified dementia without behavioral disturbance: Secondary | ICD-10-CM | POA: Diagnosis not present

## 2019-12-12 DIAGNOSIS — I509 Heart failure, unspecified: Secondary | ICD-10-CM

## 2019-12-12 DIAGNOSIS — I4821 Permanent atrial fibrillation: Secondary | ICD-10-CM | POA: Diagnosis not present

## 2019-12-12 DIAGNOSIS — M1990 Unspecified osteoarthritis, unspecified site: Secondary | ICD-10-CM

## 2019-12-12 DIAGNOSIS — E876 Hypokalemia: Secondary | ICD-10-CM

## 2019-12-12 LAB — URINALYSIS, COMPLETE
Bilirubin, UA: NEGATIVE
Glucose, UA: NEGATIVE
Ketones, UA: NEGATIVE
Nitrite, UA: NEGATIVE
Protein,UA: NEGATIVE
RBC, UA: NEGATIVE
Specific Gravity, UA: 1.02 (ref 1.005–1.030)
Urobilinogen, Ur: 0.2 mg/dL (ref 0.2–1.0)
pH, UA: 6 (ref 5.0–7.5)

## 2019-12-12 LAB — MICROSCOPIC EXAMINATION
RBC, Urine: NONE SEEN /hpf (ref 0–2)
Renal Epithel, UA: NONE SEEN /hpf

## 2019-12-13 ENCOUNTER — Ambulatory Visit: Payer: Medicare Other | Admitting: *Deleted

## 2019-12-13 ENCOUNTER — Telehealth: Payer: Self-pay | Admitting: *Deleted

## 2019-12-13 DIAGNOSIS — I4821 Permanent atrial fibrillation: Secondary | ICD-10-CM | POA: Diagnosis not present

## 2019-12-13 DIAGNOSIS — M8949 Other hypertrophic osteoarthropathy, multiple sites: Secondary | ICD-10-CM

## 2019-12-13 DIAGNOSIS — F03918 Unspecified dementia, unspecified severity, with other behavioral disturbance: Secondary | ICD-10-CM | POA: Insufficient documentation

## 2019-12-13 DIAGNOSIS — K219 Gastro-esophageal reflux disease without esophagitis: Secondary | ICD-10-CM | POA: Diagnosis not present

## 2019-12-13 DIAGNOSIS — E876 Hypokalemia: Secondary | ICD-10-CM | POA: Diagnosis not present

## 2019-12-13 DIAGNOSIS — I110 Hypertensive heart disease with heart failure: Secondary | ICD-10-CM | POA: Diagnosis not present

## 2019-12-13 DIAGNOSIS — I10 Essential (primary) hypertension: Secondary | ICD-10-CM

## 2019-12-13 DIAGNOSIS — F0391 Unspecified dementia with behavioral disturbance: Secondary | ICD-10-CM

## 2019-12-13 DIAGNOSIS — M1990 Unspecified osteoarthritis, unspecified site: Secondary | ICD-10-CM | POA: Diagnosis not present

## 2019-12-13 DIAGNOSIS — Z85828 Personal history of other malignant neoplasm of skin: Secondary | ICD-10-CM | POA: Diagnosis not present

## 2019-12-13 DIAGNOSIS — I509 Heart failure, unspecified: Secondary | ICD-10-CM | POA: Diagnosis not present

## 2019-12-13 DIAGNOSIS — E039 Hypothyroidism, unspecified: Secondary | ICD-10-CM | POA: Diagnosis not present

## 2019-12-13 DIAGNOSIS — R296 Repeated falls: Secondary | ICD-10-CM
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
## 2019-12-13 NOTE — Telephone Encounter (Signed)
Yes ok to put on remote.  Please avoid placing in a 4p or 415p slot though.  Thanks so much

## 2019-12-13 NOTE — Telephone Encounter (Signed)
Appt made/daughter aware 

## 2019-12-13 NOTE — Telephone Encounter (Signed)
Will this be okay to put on remote day?
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
## 2019-12-13 NOTE — Telephone Encounter (Signed)
12/13/2019  See telephone note from 4/21 for reference. Patient needs an in person visit with Dr Lajuana Ripple so that she can assess her and document whether she needs 24 hour supervision. I spoke with Katrina, Case Manager with [employer], and also with the patient's daughter Bethena Roys. Bethena Roys is willing to bring her in for a visit.  Please call Bethena Roys to schedule an in-person visit ASAP. The letter needs to be faxed to [phone] attn: Drusilla Kanner, BSN, RN-BC [place] [facility] [place] [facility] Direct Dial: [phone]

## 2019-12-15 ENCOUNTER — Telehealth: Payer: Self-pay | Admitting: Family Medicine

## 2019-12-15 LAB — URINE CULTURE

## 2019-12-15 NOTE — Telephone Encounter (Signed)
Elaine Kennedy patient Please review urine culture results and advise.

## 2019-12-16 DIAGNOSIS — M6281 Muscle weakness (generalized): Secondary | ICD-10-CM | POA: Diagnosis not present

## 2019-12-16 DIAGNOSIS — Z9181 History of falling: Secondary | ICD-10-CM | POA: Diagnosis not present

## 2019-12-18 ENCOUNTER — Ambulatory Visit: Payer: Medicare Other | Admitting: Physician Assistant

## 2019-12-18 ENCOUNTER — Other Ambulatory Visit: Payer: Self-pay | Admitting: Family Medicine

## 2019-12-18 DIAGNOSIS — N3000 Acute cystitis without hematuria: Secondary | ICD-10-CM

## 2019-12-18 MED ORDER — AMOXICILLIN 500 MG PO CAPS: 500.0000 mg | ORAL_CAPSULE | Freq: Three times a day (TID) | ORAL | 0 refills | Status: DC

## 2019-12-18 NOTE — Telephone Encounter (Signed)
PCP will  Review and route to pools

## 2019-12-22 ENCOUNTER — Other Ambulatory Visit: Payer: Self-pay

## 2019-12-22 ENCOUNTER — Ambulatory Visit (INDEPENDENT_AMBULATORY_CARE_PROVIDER_SITE_OTHER): Payer: Medicare Other | Admitting: Family Medicine

## 2019-12-22 VITALS — BP 156/83 | HR 80 | Temp 98.2°F | Ht 62.0 in

## 2019-12-22 DIAGNOSIS — Z9189 Other specified personal risk factors, not elsewhere classified: Secondary | ICD-10-CM

## 2019-12-22 DIAGNOSIS — E876 Hypokalemia: Secondary | ICD-10-CM | POA: Diagnosis not present

## 2019-12-22 DIAGNOSIS — Z87440 Personal history of urinary (tract) infections: Secondary | ICD-10-CM

## 2019-12-22 DIAGNOSIS — Z7409 Other reduced mobility: Secondary | ICD-10-CM | POA: Diagnosis not present

## 2019-12-22 DIAGNOSIS — Z789 Other specified health status: Secondary | ICD-10-CM

## 2019-12-22 DIAGNOSIS — F0391 Unspecified dementia with behavioral disturbance: Secondary | ICD-10-CM

## 2019-12-22 LAB — URINALYSIS, COMPLETE
Bilirubin, UA: NEGATIVE
Glucose, UA: NEGATIVE
Ketones, UA: NEGATIVE
Leukocytes,UA: NEGATIVE
Nitrite, UA: NEGATIVE
Protein,UA: NEGATIVE
RBC, UA: NEGATIVE
Specific Gravity, UA: 1.015 (ref 1.005–1.030)
Urobilinogen, Ur: 0.2 mg/dL (ref 0.2–1.0)
pH, UA: 6.5 (ref 5.0–7.5)

## 2019-12-22 LAB — MICROSCOPIC EXAMINATION
RBC, Urine: NONE SEEN /hpf (ref 0–2)
Renal Epithel, UA: NONE SEEN /hpf
WBC, UA: NONE SEEN /hpf (ref 0–5)

## 2019-12-22 NOTE — Patient Instructions (Signed)
You had labs performed today.  You will be contacted with the results of the labs once they are available, usually in the next 3 business days for routine lab work.  If you have an active my chart account, they will be released to your MyChart.  If you prefer to have these labs released to you via telephone, please let us know.  If you had a pap smear or biopsy performed, expect to be contacted in about 7-10 days.  I will coordinate a note for Elaine Kennedy.  Make sure Ms Cailani is getting enough fluids to prevent recurrent urinary tract infections.  Also add Breeze/ protein drink to help maintain health/ weight
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
## 2019-12-23 LAB — MAGNESIUM: Magnesium: 2.4 mg/dL — ABNORMAL HIGH (ref 1.6–2.3)

## 2019-12-23 LAB — BASIC METABOLIC PANEL
BUN/Creatinine Ratio: 15 (ref 12–28)
BUN: 14 mg/dL (ref 10–36)
CO2: 24 mmol/L (ref 20–29)
Calcium: 8.7 mg/dL (ref 8.7–10.3)
Chloride: 104 mmol/L (ref 96–106)
Creatinine, Ser: 0.94 mg/dL (ref 0.57–1.00)
GFR calc Af Amer: 61 mL/min/{1.73_m2} (ref >59)
GFR calc non Af Amer: 53 mL/min/{1.73_m2} — ABNORMAL LOW (ref >59)
Glucose: 92 mg/dL (ref 65–99)
Potassium: 3.5 mmol/L (ref 3.5–5.2)
Sodium: 142 mmol/L (ref 134–144)

## 2019-12-27 ENCOUNTER — Telehealth: Payer: Self-pay | Admitting: Family Medicine

## 2019-12-27 NOTE — Telephone Encounter (Signed)
Please review urine for patient do not see culture was done. Covering PCP.

## 2019-12-28 ENCOUNTER — Telehealth: Payer: Self-pay

## 2019-12-28 DIAGNOSIS — I509 Heart failure, unspecified: Secondary | ICD-10-CM | POA: Diagnosis not present

## 2019-12-28 DIAGNOSIS — E039 Hypothyroidism, unspecified: Secondary | ICD-10-CM | POA: Diagnosis not present

## 2019-12-28 DIAGNOSIS — K219 Gastro-esophageal reflux disease without esophagitis: Secondary | ICD-10-CM | POA: Diagnosis not present

## 2019-12-28 DIAGNOSIS — Z85828 Personal history of other malignant neoplasm of skin: Secondary | ICD-10-CM | POA: Diagnosis not present

## 2019-12-28 DIAGNOSIS — E876 Hypokalemia: Secondary | ICD-10-CM | POA: Diagnosis not present

## 2019-12-28 DIAGNOSIS — I4821 Permanent atrial fibrillation: Secondary | ICD-10-CM | POA: Diagnosis not present

## 2019-12-28 DIAGNOSIS — M1990 Unspecified osteoarthritis, unspecified site: Secondary | ICD-10-CM | POA: Diagnosis not present

## 2019-12-28 DIAGNOSIS — I110 Hypertensive heart disease with heart failure: Secondary | ICD-10-CM | POA: Diagnosis not present

## 2019-12-28 NOTE — Telephone Encounter (Signed)
A culture was not completed as her UA was completely normal.

## 2019-12-28 NOTE — Telephone Encounter (Signed)
Slight decline in kidney function, otherwise, no significant abnormality in lab work.

## 2019-12-28 NOTE — Telephone Encounter (Signed)
Daughter aware of urine. Daughter wants lab results. States mother is really confused and disoriented. Patient has been dx with Dementia.

## 2019-12-28 NOTE — Telephone Encounter (Signed)
Daughter aware of results.

## 2019-12-29 ENCOUNTER — Telehealth: Payer: Self-pay | Admitting: Family Medicine

## 2019-12-29 ENCOUNTER — Other Ambulatory Visit: Payer: Self-pay

## 2019-12-29 DIAGNOSIS — Z87440 Personal history of urinary (tract) infections: Secondary | ICD-10-CM

## 2019-12-29 NOTE — Telephone Encounter (Signed)
Spoke with caregiver and she states that she thinks she stilll has the UTI. Advised that we should recheck urine. She will bring urine sample by Monday and we will go from there. If patient worsens over the weekend then they will take her to urgent care.

## 2019-12-29 NOTE — Telephone Encounter (Signed)
FYI for provider and back up provider.

## 2019-12-29 NOTE — Telephone Encounter (Signed)
Is patient normally disoriented, I do not know her case, she could be having some kind of infection like a UTI, she may need to come in and be seen or be seen in an urgent care or emergency department if she continues to be so confused.

## 2019-12-31 DIAGNOSIS — E039 Hypothyroidism, unspecified: Secondary | ICD-10-CM | POA: Diagnosis not present

## 2019-12-31 DIAGNOSIS — R918 Other nonspecific abnormal finding of lung field: Secondary | ICD-10-CM | POA: Diagnosis not present

## 2019-12-31 DIAGNOSIS — I10 Essential (primary) hypertension: Secondary | ICD-10-CM | POA: Diagnosis not present

## 2019-12-31 DIAGNOSIS — S2020XA Contusion of thorax, unspecified, initial encounter: Secondary | ICD-10-CM | POA: Diagnosis not present

## 2019-12-31 DIAGNOSIS — M1990 Unspecified osteoarthritis, unspecified site: Secondary | ICD-10-CM | POA: Diagnosis not present

## 2019-12-31 DIAGNOSIS — S62604A Fracture of unspecified phalanx of right ring finger, initial encounter for closed fracture: Secondary | ICD-10-CM | POA: Diagnosis not present

## 2019-12-31 DIAGNOSIS — R609 Edema, unspecified: Secondary | ICD-10-CM | POA: Diagnosis not present

## 2019-12-31 DIAGNOSIS — S0081XA Abrasion of other part of head, initial encounter: Secondary | ICD-10-CM | POA: Diagnosis not present

## 2019-12-31 DIAGNOSIS — S3993XA Unspecified injury of pelvis, initial encounter: Secondary | ICD-10-CM | POA: Diagnosis not present

## 2019-12-31 DIAGNOSIS — R58 Hemorrhage, not elsewhere classified: Secondary | ICD-10-CM | POA: Diagnosis not present

## 2019-12-31 DIAGNOSIS — S0003XA Contusion of scalp, initial encounter: Secondary | ICD-10-CM | POA: Diagnosis not present

## 2019-12-31 DIAGNOSIS — Z743 Need for continuous supervision: Secondary | ICD-10-CM | POA: Diagnosis not present

## 2019-12-31 DIAGNOSIS — S098XXA Other specified injuries of head, initial encounter: Secondary | ICD-10-CM | POA: Diagnosis not present

## 2019-12-31 DIAGNOSIS — M79643 Pain in unspecified hand: Secondary | ICD-10-CM | POA: Diagnosis not present

## 2019-12-31 DIAGNOSIS — S0990XA Unspecified injury of head, initial encounter: Secondary | ICD-10-CM | POA: Diagnosis not present

## 2019-12-31 DIAGNOSIS — W010XXA Fall on same level from slipping, tripping and stumbling without subsequent striking against object, initial encounter: Secondary | ICD-10-CM | POA: Diagnosis not present

## 2019-12-31 DIAGNOSIS — S62616A Displaced fracture of proximal phalanx of right little finger, initial encounter for closed fracture: Secondary | ICD-10-CM | POA: Diagnosis not present

## 2019-12-31 DIAGNOSIS — T07XXXA Unspecified multiple injuries, initial encounter: Secondary | ICD-10-CM | POA: Diagnosis not present

## 2019-12-31 DIAGNOSIS — E785 Hyperlipidemia, unspecified: Secondary | ICD-10-CM | POA: Diagnosis not present

## 2019-12-31 DIAGNOSIS — S199XXA Unspecified injury of neck, initial encounter: Secondary | ICD-10-CM | POA: Diagnosis not present

## 2020-01-01 ENCOUNTER — Telehealth: Payer: Self-pay | Admitting: Family Medicine

## 2020-01-01 ENCOUNTER — Other Ambulatory Visit: Payer: Self-pay

## 2020-01-01 ENCOUNTER — Other Ambulatory Visit: Payer: Medicare Other

## 2020-01-01 DIAGNOSIS — Z87440 Personal history of urinary (tract) infections: Secondary | ICD-10-CM | POA: Diagnosis not present

## 2020-01-01 LAB — URINALYSIS, COMPLETE
Bilirubin, UA: NEGATIVE
Glucose, UA: NEGATIVE
Ketones, UA: NEGATIVE
Nitrite, UA: NEGATIVE
Protein,UA: NEGATIVE
RBC, UA: NEGATIVE
Specific Gravity, UA: 1.01 (ref 1.005–1.030)
Urobilinogen, Ur: 0.2 mg/dL (ref 0.2–1.0)
pH, UA: 6.5 (ref 5.0–7.5)

## 2020-01-01 LAB — MICROSCOPIC EXAMINATION
Epithelial Cells (non renal): NONE SEEN /hpf (ref 0–10)
RBC, Urine: NONE SEEN /hpf (ref 0–2)
Renal Epithel, UA: NONE SEEN /hpf

## 2020-01-01 NOTE — Telephone Encounter (Signed)
Daughter called requesting hospital follow up. Appt made per patients request

## 2020-01-03 ENCOUNTER — Other Ambulatory Visit: Payer: Self-pay

## 2020-01-03 ENCOUNTER — Ambulatory Visit: Payer: Medicare Other | Admitting: Family Medicine

## 2020-01-03 ENCOUNTER — Ambulatory Visit (INDEPENDENT_AMBULATORY_CARE_PROVIDER_SITE_OTHER): Payer: Medicare Other | Admitting: Family Medicine

## 2020-01-03 ENCOUNTER — Encounter: Payer: Self-pay | Admitting: Family Medicine

## 2020-01-03 VITALS — BP 151/70 | HR 77 | Temp 97.7°F

## 2020-01-03 DIAGNOSIS — S62616G Displaced fracture of proximal phalanx of right little finger, subsequent encounter for fracture with delayed healing: Secondary | ICD-10-CM

## 2020-01-03 DIAGNOSIS — S022XXD Fracture of nasal bones, subsequent encounter for fracture with routine healing: Secondary | ICD-10-CM | POA: Diagnosis not present

## 2020-01-03 DIAGNOSIS — T148XXA Other injury of unspecified body region, initial encounter: Secondary | ICD-10-CM

## 2020-01-03 LAB — URINE CULTURE

## 2020-01-03 NOTE — Patient Instructions (Signed)
Arnicare Gel for bruising.  Tylenol 650 mg four times daily.   Elevate right hand as much as possible.

## 2020-01-03 NOTE — Progress Notes (Signed)
Assessment & Plan:  1. Closed displaced fracture of proximal phalanx of right little finger with delayed healing, subsequent encounter - Splint applied in office as patient is moving her fingers all around even with the buddy tape on them.  Encouraged to elevate her hand on a pillow when she is sitting or lying.  Advised she may take Tylenol 650 mg 4 times daily. - Ambulatory referral to Orthopedic Surgery  2. Closed fracture of nasal bone with routine healing, subsequent encounter - Ambulatory referral to ENT  3.  Bruising - Arnicare gel.    Follow up plan: Return if symptoms worsen or fail to improve.  Hendricks Limes, MSN, APRN, FNP-C [facility]  Subjective:   Patient ID: CHANTIL STABLES, female    DOB: 1927/05/14, 86 y.o.   MRN: [id number]  HPI: Elaine Kennedy is a 86 y.o. female presenting on 01/03/2020 for Hospitalization Follow-up (fall- 12/31/19 [facility])  Patient was seen at [facility] on 12/[facility]/2019 after a fall walking across some stepping stones.  Her daughter that is with her reports she thinks the wheels got caught in a crack.  Patient sustained bilateral nasal bone is well as an angulated segmental fractures of the anterior bony nasal septum.  She also has a displaced fracture of the fifth proximal phalanx which was buddy taped in the ER.  Patient denies any pain just states that she is a little sore in her hand.  She is taking Tylenol 650 mg twice daily.   ROS: Negative unless specifically indicated above in HPI.   Relevant past medical history reviewed and updated as indicated.   Allergies and medications reviewed and updated.   Current Outpatient Medications:  .  acetaminophen (TYLENOL) 650 MG CR tablet, Take 650 mg by mouth every 8 (eight) hours as needed for pain. , Disp: , Rfl:  .  aspirin EC 81 MG tablet, Take 81 mg by mouth daily., Disp: , Rfl:  .  Baclofen 5 MG TABS, Take 5 mg by mouth 3 (three) times daily as needed., Disp: 30  tablet, Rfl: 0 .  carbidopa-levodopa (SINEMET IR) 25-100 MG tablet, Take 1 tablet by mouth in the morning, at noon, and at bedtime., Disp: , Rfl:  .  cholecalciferol (VITAMIN D) 1000 units tablet, Take 1,000 Units by mouth daily., Disp: , Rfl:  .  ferrous sulfate 325 (65 FE) MG tablet, Take 325 mg by mouth daily with breakfast., Disp: , Rfl:  .  levothyroxine (SYNTHROID) 88 MCG tablet, TAKE ONE TABLET (88 MCG TOTAL) BY MOUTH DAILY., Disp: 90 tablet, Rfl: 0 .  metoprolol succinate (TOPROL-XL) 50 MG 24 hr tablet, TAKE 1 TABLET BY MOUTH EVERY DAY, Disp: 90 tablet, Rfl: 0 .  Multiple Vitamins-Minerals (MULTIVITAMIN WITH MINERALS) tablet, Take 1 tablet by mouth daily. Spectra-vite, Disp: , Rfl:  .  omeprazole (PRILOSEC) 40 MG capsule, TAKE 1 CAPSULE BY MOUTH EVERY DAY, Disp: 90 capsule, Rfl: 1  Allergies  Allergen Reactions  . Sulfa Antibiotics Diarrhea    Objective:   BP (!) 151/70   Pulse 77   Temp 97.7 F (36.5 C) (Temporal)   SpO2 100%    Physical Exam Vitals reviewed.  Constitutional:      General: She is not in acute distress.    Appearance: Normal appearance. She is not ill-appearing, toxic-appearing or diaphoretic.  HENT:     Head: Normocephalic and atraumatic.  Eyes:     General: No scleral icterus.  Right eye: No discharge.        Left eye: No discharge.     Conjunctiva/sclera: Conjunctivae normal.  Cardiovascular:     Rate and Rhythm: Normal rate and regular rhythm.     Heart sounds: Normal heart sounds. No murmur. No friction rub. No gallop.   Pulmonary:     Effort: Pulmonary effort is normal. No respiratory distress.     Breath sounds: Normal breath sounds. No stridor. No wheezing, rhonchi or rales.  Musculoskeletal:        General: Normal range of motion.     Right hand: Swelling present.     Cervical back: Normal range of motion.  Skin:    General: Skin is warm and dry.     Capillary Refill: Capillary refill takes less than 2 seconds.     Findings:  Bruising (face and right hand) present.  Neurological:     General: No focal deficit present.     Mental Status: She is alert and oriented to person, place, and time. Mental status is at baseline.  Psychiatric:        Mood and Affect: Mood normal.        Behavior: Behavior normal.        Thought Content: Thought content normal.        Judgment: Judgment normal.

## 2020-01-06 DIAGNOSIS — Z7982 Long term (current) use of aspirin: Secondary | ICD-10-CM | POA: Diagnosis not present

## 2020-01-06 DIAGNOSIS — S81811D Laceration without foreign body, right lower leg, subsequent encounter: Secondary | ICD-10-CM | POA: Diagnosis not present

## 2020-01-06 DIAGNOSIS — I5033 Acute on chronic diastolic (congestive) heart failure: Secondary | ICD-10-CM | POA: Diagnosis not present

## 2020-01-06 DIAGNOSIS — Z9181 History of falling: Secondary | ICD-10-CM | POA: Diagnosis not present

## 2020-01-06 DIAGNOSIS — S022XXD Fracture of nasal bones, subsequent encounter for fracture with routine healing: Secondary | ICD-10-CM | POA: Diagnosis not present

## 2020-01-06 DIAGNOSIS — I872 Venous insufficiency (chronic) (peripheral): Secondary | ICD-10-CM | POA: Diagnosis not present

## 2020-01-06 DIAGNOSIS — I110 Hypertensive heart disease with heart failure: Secondary | ICD-10-CM | POA: Diagnosis not present

## 2020-01-06 DIAGNOSIS — I4821 Permanent atrial fibrillation: Secondary | ICD-10-CM | POA: Diagnosis not present

## 2020-01-06 DIAGNOSIS — S0083XD Contusion of other part of head, subsequent encounter: Secondary | ICD-10-CM | POA: Diagnosis not present

## 2020-01-06 DIAGNOSIS — M1990 Unspecified osteoarthritis, unspecified site: Secondary | ICD-10-CM | POA: Diagnosis not present

## 2020-01-06 DIAGNOSIS — S62606D Fracture of unspecified phalanx of right little finger, subsequent encounter for fracture with routine healing: Secondary | ICD-10-CM | POA: Diagnosis not present

## 2020-01-06 DIAGNOSIS — Z8673 Personal history of transient ischemic attack (TIA), and cerebral infarction without residual deficits: Secondary | ICD-10-CM | POA: Diagnosis not present

## 2020-01-06 DIAGNOSIS — E039 Hypothyroidism, unspecified: Secondary | ICD-10-CM | POA: Diagnosis not present

## 2020-01-06 DIAGNOSIS — S81812D Laceration without foreign body, left lower leg, subsequent encounter: Secondary | ICD-10-CM | POA: Diagnosis not present

## 2020-01-06 DIAGNOSIS — Z85828 Personal history of other malignant neoplasm of skin: Secondary | ICD-10-CM | POA: Diagnosis not present

## 2020-01-06 DIAGNOSIS — K219 Gastro-esophageal reflux disease without esophagitis: Secondary | ICD-10-CM | POA: Diagnosis not present

## 2020-01-08 DIAGNOSIS — S022XXA Fracture of nasal bones, initial encounter for closed fracture: Secondary | ICD-10-CM | POA: Diagnosis not present

## 2020-01-09 DIAGNOSIS — G20 Parkinson's disease: Secondary | ICD-10-CM | POA: Diagnosis not present

## 2020-01-09 DIAGNOSIS — M130 Polyarthritis, unspecified: Secondary | ICD-10-CM | POA: Diagnosis not present

## 2020-01-09 DIAGNOSIS — Z79899 Other long term (current) drug therapy: Secondary | ICD-10-CM | POA: Diagnosis not present

## 2020-01-09 DIAGNOSIS — R2689 Other abnormalities of gait and mobility: Secondary | ICD-10-CM | POA: Diagnosis not present

## 2020-01-11 DIAGNOSIS — Z7982 Long term (current) use of aspirin: Secondary | ICD-10-CM | POA: Diagnosis not present

## 2020-01-11 DIAGNOSIS — I4821 Permanent atrial fibrillation: Secondary | ICD-10-CM | POA: Diagnosis not present

## 2020-01-11 DIAGNOSIS — Z9181 History of falling: Secondary | ICD-10-CM | POA: Diagnosis not present

## 2020-01-11 DIAGNOSIS — S62606D Fracture of unspecified phalanx of right little finger, subsequent encounter for fracture with routine healing: Secondary | ICD-10-CM | POA: Diagnosis not present

## 2020-01-11 DIAGNOSIS — Z8673 Personal history of transient ischemic attack (TIA), and cerebral infarction without residual deficits: Secondary | ICD-10-CM | POA: Diagnosis not present

## 2020-01-11 DIAGNOSIS — K219 Gastro-esophageal reflux disease without esophagitis: Secondary | ICD-10-CM | POA: Diagnosis not present

## 2020-01-11 DIAGNOSIS — Z85828 Personal history of other malignant neoplasm of skin: Secondary | ICD-10-CM | POA: Diagnosis not present

## 2020-01-11 DIAGNOSIS — I5033 Acute on chronic diastolic (congestive) heart failure: Secondary | ICD-10-CM | POA: Diagnosis not present

## 2020-01-11 DIAGNOSIS — S81811D Laceration without foreign body, right lower leg, subsequent encounter: Secondary | ICD-10-CM | POA: Diagnosis not present

## 2020-01-11 DIAGNOSIS — I110 Hypertensive heart disease with heart failure: Secondary | ICD-10-CM | POA: Diagnosis not present

## 2020-01-11 DIAGNOSIS — S62616A Displaced fracture of proximal phalanx of right little finger, initial encounter for closed fracture: Secondary | ICD-10-CM | POA: Diagnosis not present

## 2020-01-11 DIAGNOSIS — S022XXD Fracture of nasal bones, subsequent encounter for fracture with routine healing: Secondary | ICD-10-CM | POA: Diagnosis not present

## 2020-01-11 DIAGNOSIS — M1990 Unspecified osteoarthritis, unspecified site: Secondary | ICD-10-CM | POA: Diagnosis not present

## 2020-01-11 DIAGNOSIS — E039 Hypothyroidism, unspecified: Secondary | ICD-10-CM | POA: Diagnosis not present

## 2020-01-11 DIAGNOSIS — S81812D Laceration without foreign body, left lower leg, subsequent encounter: Secondary | ICD-10-CM | POA: Diagnosis not present

## 2020-01-11 DIAGNOSIS — S0083XD Contusion of other part of head, subsequent encounter: Secondary | ICD-10-CM | POA: Diagnosis not present

## 2020-01-11 DIAGNOSIS — I872 Venous insufficiency (chronic) (peripheral): Secondary | ICD-10-CM | POA: Diagnosis not present

## 2020-01-16 DIAGNOSIS — Z9181 History of falling: Secondary | ICD-10-CM | POA: Diagnosis not present

## 2020-01-16 DIAGNOSIS — M6281 Muscle weakness (generalized): Secondary | ICD-10-CM | POA: Diagnosis not present

## 2020-01-17 DIAGNOSIS — Z85828 Personal history of other malignant neoplasm of skin: Secondary | ICD-10-CM | POA: Diagnosis not present

## 2020-01-17 DIAGNOSIS — S62606D Fracture of unspecified phalanx of right little finger, subsequent encounter for fracture with routine healing: Secondary | ICD-10-CM | POA: Diagnosis not present

## 2020-01-17 DIAGNOSIS — S81812D Laceration without foreign body, left lower leg, subsequent encounter: Secondary | ICD-10-CM | POA: Diagnosis not present

## 2020-01-17 DIAGNOSIS — Z7982 Long term (current) use of aspirin: Secondary | ICD-10-CM | POA: Diagnosis not present

## 2020-01-17 DIAGNOSIS — I4821 Permanent atrial fibrillation: Secondary | ICD-10-CM | POA: Diagnosis not present

## 2020-01-17 DIAGNOSIS — Z8673 Personal history of transient ischemic attack (TIA), and cerebral infarction without residual deficits: Secondary | ICD-10-CM | POA: Diagnosis not present

## 2020-01-17 DIAGNOSIS — I5033 Acute on chronic diastolic (congestive) heart failure: Secondary | ICD-10-CM | POA: Diagnosis not present

## 2020-01-17 DIAGNOSIS — S81811D Laceration without foreign body, right lower leg, subsequent encounter: Secondary | ICD-10-CM | POA: Diagnosis not present

## 2020-01-17 DIAGNOSIS — I110 Hypertensive heart disease with heart failure: Secondary | ICD-10-CM | POA: Diagnosis not present

## 2020-01-17 DIAGNOSIS — K219 Gastro-esophageal reflux disease without esophagitis: Secondary | ICD-10-CM | POA: Diagnosis not present

## 2020-01-17 DIAGNOSIS — S0083XD Contusion of other part of head, subsequent encounter: Secondary | ICD-10-CM | POA: Diagnosis not present

## 2020-01-17 DIAGNOSIS — M1990 Unspecified osteoarthritis, unspecified site: Secondary | ICD-10-CM | POA: Diagnosis not present

## 2020-01-17 DIAGNOSIS — Z9181 History of falling: Secondary | ICD-10-CM | POA: Diagnosis not present

## 2020-01-17 DIAGNOSIS — I872 Venous insufficiency (chronic) (peripheral): Secondary | ICD-10-CM | POA: Diagnosis not present

## 2020-01-17 DIAGNOSIS — E039 Hypothyroidism, unspecified: Secondary | ICD-10-CM | POA: Diagnosis not present

## 2020-01-17 DIAGNOSIS — S022XXD Fracture of nasal bones, subsequent encounter for fracture with routine healing: Secondary | ICD-10-CM | POA: Diagnosis not present

## 2020-01-18 DIAGNOSIS — I5033 Acute on chronic diastolic (congestive) heart failure: Secondary | ICD-10-CM | POA: Diagnosis not present

## 2020-01-18 DIAGNOSIS — S81812D Laceration without foreign body, left lower leg, subsequent encounter: Secondary | ICD-10-CM | POA: Diagnosis not present

## 2020-01-18 DIAGNOSIS — K219 Gastro-esophageal reflux disease without esophagitis: Secondary | ICD-10-CM | POA: Diagnosis not present

## 2020-01-18 DIAGNOSIS — S0083XD Contusion of other part of head, subsequent encounter: Secondary | ICD-10-CM | POA: Diagnosis not present

## 2020-01-18 DIAGNOSIS — I872 Venous insufficiency (chronic) (peripheral): Secondary | ICD-10-CM | POA: Diagnosis not present

## 2020-01-18 DIAGNOSIS — Z9181 History of falling: Secondary | ICD-10-CM | POA: Diagnosis not present

## 2020-01-18 DIAGNOSIS — M1990 Unspecified osteoarthritis, unspecified site: Secondary | ICD-10-CM | POA: Diagnosis not present

## 2020-01-18 DIAGNOSIS — S81811D Laceration without foreign body, right lower leg, subsequent encounter: Secondary | ICD-10-CM | POA: Diagnosis not present

## 2020-01-18 DIAGNOSIS — Z7982 Long term (current) use of aspirin: Secondary | ICD-10-CM | POA: Diagnosis not present

## 2020-01-18 DIAGNOSIS — S62606D Fracture of unspecified phalanx of right little finger, subsequent encounter for fracture with routine healing: Secondary | ICD-10-CM | POA: Diagnosis not present

## 2020-01-18 DIAGNOSIS — I110 Hypertensive heart disease with heart failure: Secondary | ICD-10-CM | POA: Diagnosis not present

## 2020-01-18 DIAGNOSIS — S022XXD Fracture of nasal bones, subsequent encounter for fracture with routine healing: Secondary | ICD-10-CM | POA: Diagnosis not present

## 2020-01-18 DIAGNOSIS — Z85828 Personal history of other malignant neoplasm of skin: Secondary | ICD-10-CM | POA: Diagnosis not present

## 2020-01-18 DIAGNOSIS — I4821 Permanent atrial fibrillation: Secondary | ICD-10-CM | POA: Diagnosis not present

## 2020-01-18 DIAGNOSIS — E039 Hypothyroidism, unspecified: Secondary | ICD-10-CM | POA: Diagnosis not present

## 2020-01-18 DIAGNOSIS — Z8673 Personal history of transient ischemic attack (TIA), and cerebral infarction without residual deficits: Secondary | ICD-10-CM | POA: Diagnosis not present

## 2020-01-19 ENCOUNTER — Other Ambulatory Visit: Payer: Medicare Other

## 2020-01-19 ENCOUNTER — Other Ambulatory Visit: Payer: Self-pay

## 2020-01-19 ENCOUNTER — Telehealth (INDEPENDENT_AMBULATORY_CARE_PROVIDER_SITE_OTHER): Payer: Medicare Other | Admitting: Family Medicine

## 2020-01-19 DIAGNOSIS — R410 Disorientation, unspecified: Secondary | ICD-10-CM | POA: Diagnosis not present

## 2020-01-19 LAB — MICROSCOPIC EXAMINATION
RBC, Urine: NONE SEEN /hpf (ref 0–2)
Renal Epithel, UA: NONE SEEN /hpf

## 2020-01-19 LAB — URINALYSIS, COMPLETE
Bilirubin, UA: NEGATIVE
Glucose, UA: NEGATIVE
Ketones, UA: NEGATIVE
Leukocytes,UA: NEGATIVE
Nitrite, UA: NEGATIVE
Protein,UA: NEGATIVE
RBC, UA: NEGATIVE
Specific Gravity, UA: 1.01 (ref 1.005–1.030)
Urobilinogen, Ur: 0.2 mg/dL (ref 0.2–1.0)
pH, UA: 6.5 (ref 5.0–7.5)

## 2020-01-19 NOTE — Telephone Encounter (Signed)
Telephone visit  Subjective: CC: disorientation PCP: [name], DO WOE:HOZYY L Bachmann is a 86 y.o. female calls for telephone consult today. Patient provides verbal consent for consult held via phone.  Due to COVID-19 pandemic this visit was conducted virtually. This visit type was conducted due to national recommendations for restrictions regarding the COVID-19 Pandemic (e.g. social distancing, sheltering in place) in an effort to limit this patient's exposure and mitigate transmission in our community. All issues noted in this document were discussed and addressed.  A physical exam was not performed with this format.   Location of patient: home Location of provider: [facility] Others present for call: family member  87. Disorientation She reports disorientation for the past week. Of note she did fall and fracture her finger.  Her caregiver reports swelling on the knee where her sores are.  These are bandaged.  She has appointment with orthopedics on 6/9.  Apparently, her previous PCP collected a UA monthly for recurrent UTI.  No hematuria, fevers reported.  ROS: Per HPI  Allergies  Allergen Reactions  . Sulfa Antibiotics Diarrhea   Past Medical History:  Diagnosis Date  . Arthritis   . Cancer ([place])    skin cancer on head.  [name] Dysrhythmia    AFib  . GERD (gastroesophageal reflux disease)   . Hypothyroidism     Current Outpatient Medications:  .  acetaminophen (TYLENOL) 650 MG CR tablet, Take 650 mg by mouth every 8 (eight) hours as needed for pain. , Disp: , Rfl:  .  aspirin EC 81 MG tablet, Take 81 mg by mouth daily., Disp: , Rfl:  .  Baclofen 5 MG TABS, Take 5 mg by mouth 3 (three) times daily as needed., Disp: 30 tablet, Rfl: 0 .  carbidopa-levodopa (SINEMET IR) 25-100 MG tablet, Take 1 tablet by mouth in the morning, at noon, and at bedtime., Disp: , Rfl:  .  cholecalciferol (VITAMIN D) 1000 units tablet, Take 1,000 Units by mouth daily., Disp: , Rfl:  .  ferrous sulfate  325 (65 FE) MG tablet, Take 325 mg by mouth daily with breakfast., Disp: , Rfl:  .  levothyroxine (SYNTHROID) 88 MCG tablet, TAKE ONE TABLET (88 MCG TOTAL) BY MOUTH DAILY., Disp: 90 tablet, Rfl: 0 .  metoprolol succinate (TOPROL-XL) 50 MG 24 hr tablet, TAKE 1 TABLET BY MOUTH EVERY DAY, Disp: 90 tablet, Rfl: 0 .  Multiple Vitamins-Minerals (MULTIVITAMIN WITH MINERALS) tablet, Take 1 tablet by mouth daily. Spectra-vite, Disp: , Rfl:  .  omeprazole (PRILOSEC) 40 MG capsule, TAKE 1 CAPSULE BY MOUTH EVERY DAY, Disp: 90 capsule, Rfl: 1  Assessment/ Plan: 86 y.o. female   1. Disorientation  ? Recent fracture/ pain, vs UTI, vs exacerbation of dementia.  Home care instructions reviewed.  Red flags discussed. - Urinalysis, Complete - Urine Culture   Start time: 4:13pm End time: 4:17pm  Total time spent on patient care (including telephone call/ virtual visit): 10 minutes  [place], [place] [phone]

## 2020-01-22 ENCOUNTER — Encounter: Payer: Medicare Other | Admitting: Nurse Practitioner

## 2020-01-22 NOTE — Progress Notes (Signed)
Wrong patient cheduled

## 2020-01-23 ENCOUNTER — Telehealth: Payer: Self-pay | Admitting: Family Medicine

## 2020-01-23 ENCOUNTER — Other Ambulatory Visit: Payer: Self-pay | Admitting: Family Medicine

## 2020-01-23 NOTE — Telephone Encounter (Signed)
Preliminary report shows e.coli. Waiting on final report.

## 2020-01-24 ENCOUNTER — Ambulatory Visit (INDEPENDENT_AMBULATORY_CARE_PROVIDER_SITE_OTHER): Payer: Medicare Other | Admitting: Family Medicine

## 2020-01-24 ENCOUNTER — Other Ambulatory Visit: Payer: Self-pay

## 2020-01-24 ENCOUNTER — Other Ambulatory Visit: Payer: Self-pay | Admitting: Family Medicine

## 2020-01-24 ENCOUNTER — Encounter: Payer: Self-pay | Admitting: Family Medicine

## 2020-01-24 VITALS — BP 136/84 | HR 67 | Temp 96.8°F | Resp 20

## 2020-01-24 DIAGNOSIS — N3090 Cystitis, unspecified without hematuria: Secondary | ICD-10-CM

## 2020-01-24 LAB — URINE CULTURE

## 2020-01-24 MED ORDER — NITROFURANTOIN MONOHYD MACRO 100 MG PO CAPS
100.0000 mg | ORAL_CAPSULE | Freq: Two times a day (BID) | ORAL | 0 refills | Status: AC
Start: 2020-01-24 — End: 2020-01-29

## 2020-01-24 MED ORDER — AMOXICILLIN 500 MG PO CAPS: 500.0000 mg | ORAL_CAPSULE | Freq: Three times a day (TID) | ORAL | 0 refills | Status: DC

## 2020-01-24 NOTE — Progress Notes (Signed)
Subjective:  Patient ID: Elaine Kennedy, female    DOB: Sep 07, 1926  Age: 86 y.o. MRN: [id number]  CC: No chief complaint on file.   HPI Elaine Kennedy presents for urinary tract symptoms.  Culture was performed a few days ago.  She is noted to have Enterobacter as well as E. coli.  She was started on diet Macrodantin by Dr. Lajuana Ripple.  She is only taken 1 dose so far.  Of note is that she has been noted to have increased confusion for about a week by observation of her daughter.  She is dragging her feet and not ambulating very well at this time.  She has not had fever chills or sweats.  No flank pain.  Depression screen [facility] 2/9 01/24/2020 01/03/2020 12/01/2019  Decreased Interest 0 0 0  Down, Depressed, Hopeless 0 0 0  PHQ - 2 Score 0 0 0  Altered sleeping - - -  Tired, decreased energy - - -  Change in appetite - - -  Feeling bad or failure about yourself  - - -  Trouble concentrating - - -  Moving slowly or fidgety/restless - - -  Suicidal thoughts - - -  PHQ-9 Score - - -  Difficult doing work/chores - - -    History Elaine Kennedy has a past medical history of Arthritis, Cancer ([place]), Dysrhythmia, GERD (gastroesophageal reflux disease), and Hypothyroidism.   She has a past surgical history that includes Total hip arthroplasty (Left); Cataract extraction w/PHACO (Left, 06/28/2017); and Cataract extraction w/PHACO (Right, 02/27/2019).   Her family history includes Cancer in her daughter; Heart murmur in her daughter.She reports that she has never smoked. She has never used smokeless tobacco. She reports that she does not drink alcohol and does not use drugs.    ROS Review of Systems  Constitutional: Negative for chills, diaphoresis and fever.  HENT: Negative for congestion.   Eyes: Negative for visual disturbance.  Respiratory: Negative for cough and shortness of breath.   Cardiovascular: Negative for chest pain and palpitations.  Gastrointestinal: Negative for constipation, diarrhea  and nausea.  Genitourinary: Positive for dysuria, frequency and urgency. Negative for decreased urine volume, flank pain, hematuria, menstrual problem and pelvic pain.  Musculoskeletal: Negative for arthralgias and joint swelling.  Skin: Negative for rash.  Neurological: Negative for dizziness and numbness.    Objective:  BP 136/84   Pulse 67   Temp (!) 96.8 F (36 C) (Temporal)   Resp 20   SpO2 97%   BP Readings from Last 3 Encounters:  01/24/20 136/84  01/03/20 (!) 151/70  12/22/19 (!) 156/83    Wt Readings from Last 3 Encounters:  07/26/19 138 lb (62.6 kg)  06/19/19 142 lb (64.4 kg)  05/16/19 146 lb 6.4 oz (66.4 kg)     Physical Exam Constitutional:      Appearance: She is well-developed.  HENT:     Head: Normocephalic and atraumatic.  Cardiovascular:     Rate and Rhythm: Normal rate and regular rhythm.     Heart sounds: No murmur heard.   Pulmonary:     Effort: Pulmonary effort is normal.     Breath sounds: Normal breath sounds.  Abdominal:     General: Bowel sounds are normal.     Palpations: Abdomen is soft. There is no mass.     Tenderness: There is no abdominal tenderness. There is no guarding or rebound.  Musculoskeletal:        General: No tenderness.  Skin:  General: Skin is warm and dry.  Neurological:     Mental Status: She is alert and oriented to person, place, and time.  Psychiatric:        Behavior: Behavior normal.       Assessment & Plan:   Diagnoses and all orders for this visit:  Cystitis  Other orders -     amoxicillin (AMOXIL) 500 MG capsule; Take 1 capsule (500 mg total) by mouth 3 (three) times daily.    She should also continue the nitrofurantoin as ordered.  I am having Elaine Kennedy start on amoxicillin. I am also having her maintain her cholecalciferol, multivitamin with minerals, ferrous sulfate, acetaminophen, aspirin EC, metoprolol succinate, Baclofen, omeprazole, carbidopa-levodopa, levothyroxine, and  nitrofurantoin (macrocrystal-monohydrate).  Allergies as of 01/24/2020      Reactions   Sulfa Antibiotics Diarrhea      Medication List       Accurate as of January 24, 2020 11:59 PM. If you have any questions, ask your nurse or doctor.        acetaminophen 650 MG CR tablet Commonly known as: TYLENOL Take 650 mg by mouth every 8 (eight) hours as needed for pain.   amoxicillin 500 MG capsule Commonly known as: AMOXIL Take 1 capsule (500 mg total) by mouth 3 (three) times daily. Started by: Claretta Fraise, MD   aspirin EC 81 MG tablet Take 81 mg by mouth daily.   Baclofen 5 MG Tabs Take 5 mg by mouth 3 (three) times daily as needed.   carbidopa-levodopa 25-100 MG tablet Commonly known as: SINEMET IR Take 1 tablet by mouth in the morning, at noon, and at bedtime.   cholecalciferol 1000 units tablet Commonly known as: VITAMIN D Take 1,000 Units by mouth daily.   ferrous sulfate 325 (65 FE) MG tablet Take 325 mg by mouth daily with breakfast.   levothyroxine 88 MCG tablet Commonly known as: SYNTHROID TAKE ONE TABLET (88 MCG TOTAL) BY MOUTH DAILY.   metoprolol succinate 50 MG 24 hr tablet Commonly known as: TOPROL-XL TAKE 1 TABLET BY MOUTH EVERY DAY   multivitamin with minerals tablet Take 1 tablet by mouth daily. Spectra-vite   nitrofurantoin (macrocrystal-monohydrate) 100 MG capsule Commonly known as: Macrobid Take 1 capsule (100 mg total) by mouth 2 (two) times daily for 5 days. Started by: Ronnie Doss, DO   omeprazole 40 MG capsule Commonly known as: PRILOSEC TAKE 1 CAPSULE BY MOUTH EVERY DAY        Follow-up: Return if symptoms worsen or fail to improve.  Claretta Fraise, M.D.

## 2020-01-24 NOTE — Telephone Encounter (Signed)
Aware of infection.

## 2020-01-25 ENCOUNTER — Encounter: Payer: Self-pay | Admitting: Family Medicine

## 2020-01-26 DIAGNOSIS — Z7982 Long term (current) use of aspirin: Secondary | ICD-10-CM | POA: Diagnosis not present

## 2020-01-26 DIAGNOSIS — S81812D Laceration without foreign body, left lower leg, subsequent encounter: Secondary | ICD-10-CM | POA: Diagnosis not present

## 2020-01-26 DIAGNOSIS — I5033 Acute on chronic diastolic (congestive) heart failure: Secondary | ICD-10-CM | POA: Diagnosis not present

## 2020-01-26 DIAGNOSIS — S0083XD Contusion of other part of head, subsequent encounter: Secondary | ICD-10-CM | POA: Diagnosis not present

## 2020-01-26 DIAGNOSIS — E039 Hypothyroidism, unspecified: Secondary | ICD-10-CM | POA: Diagnosis not present

## 2020-01-26 DIAGNOSIS — K219 Gastro-esophageal reflux disease without esophagitis: Secondary | ICD-10-CM | POA: Diagnosis not present

## 2020-01-26 DIAGNOSIS — I110 Hypertensive heart disease with heart failure: Secondary | ICD-10-CM | POA: Diagnosis not present

## 2020-01-26 DIAGNOSIS — Z9181 History of falling: Secondary | ICD-10-CM | POA: Diagnosis not present

## 2020-01-26 DIAGNOSIS — S81811D Laceration without foreign body, right lower leg, subsequent encounter: Secondary | ICD-10-CM | POA: Diagnosis not present

## 2020-01-26 DIAGNOSIS — M1990 Unspecified osteoarthritis, unspecified site: Secondary | ICD-10-CM | POA: Diagnosis not present

## 2020-01-26 DIAGNOSIS — Z8673 Personal history of transient ischemic attack (TIA), and cerebral infarction without residual deficits: Secondary | ICD-10-CM | POA: Diagnosis not present

## 2020-01-26 DIAGNOSIS — Z85828 Personal history of other malignant neoplasm of skin: Secondary | ICD-10-CM | POA: Diagnosis not present

## 2020-01-26 DIAGNOSIS — I872 Venous insufficiency (chronic) (peripheral): Secondary | ICD-10-CM | POA: Diagnosis not present

## 2020-01-26 DIAGNOSIS — I4821 Permanent atrial fibrillation: Secondary | ICD-10-CM | POA: Diagnosis not present

## 2020-01-26 DIAGNOSIS — S62606D Fracture of unspecified phalanx of right little finger, subsequent encounter for fracture with routine healing: Secondary | ICD-10-CM | POA: Diagnosis not present

## 2020-01-26 DIAGNOSIS — S022XXD Fracture of nasal bones, subsequent encounter for fracture with routine healing: Secondary | ICD-10-CM | POA: Diagnosis not present

## 2020-01-30 DIAGNOSIS — K219 Gastro-esophageal reflux disease without esophagitis: Secondary | ICD-10-CM | POA: Diagnosis not present

## 2020-01-30 DIAGNOSIS — S81812D Laceration without foreign body, left lower leg, subsequent encounter: Secondary | ICD-10-CM | POA: Diagnosis not present

## 2020-01-30 DIAGNOSIS — S022XXD Fracture of nasal bones, subsequent encounter for fracture with routine healing: Secondary | ICD-10-CM | POA: Diagnosis not present

## 2020-01-30 DIAGNOSIS — I5033 Acute on chronic diastolic (congestive) heart failure: Secondary | ICD-10-CM | POA: Diagnosis not present

## 2020-01-30 DIAGNOSIS — Z8673 Personal history of transient ischemic attack (TIA), and cerebral infarction without residual deficits: Secondary | ICD-10-CM | POA: Diagnosis not present

## 2020-01-30 DIAGNOSIS — Z85828 Personal history of other malignant neoplasm of skin: Secondary | ICD-10-CM | POA: Diagnosis not present

## 2020-01-30 DIAGNOSIS — I110 Hypertensive heart disease with heart failure: Secondary | ICD-10-CM | POA: Diagnosis not present

## 2020-01-30 DIAGNOSIS — Z7982 Long term (current) use of aspirin: Secondary | ICD-10-CM | POA: Diagnosis not present

## 2020-01-30 DIAGNOSIS — M1990 Unspecified osteoarthritis, unspecified site: Secondary | ICD-10-CM | POA: Diagnosis not present

## 2020-01-30 DIAGNOSIS — Z9181 History of falling: Secondary | ICD-10-CM | POA: Diagnosis not present

## 2020-01-30 DIAGNOSIS — I4821 Permanent atrial fibrillation: Secondary | ICD-10-CM | POA: Diagnosis not present

## 2020-01-30 DIAGNOSIS — S0083XD Contusion of other part of head, subsequent encounter: Secondary | ICD-10-CM | POA: Diagnosis not present

## 2020-01-30 DIAGNOSIS — I872 Venous insufficiency (chronic) (peripheral): Secondary | ICD-10-CM | POA: Diagnosis not present

## 2020-01-30 DIAGNOSIS — S81811D Laceration without foreign body, right lower leg, subsequent encounter: Secondary | ICD-10-CM | POA: Diagnosis not present

## 2020-01-30 DIAGNOSIS — S62606D Fracture of unspecified phalanx of right little finger, subsequent encounter for fracture with routine healing: Secondary | ICD-10-CM | POA: Diagnosis not present

## 2020-01-30 DIAGNOSIS — E039 Hypothyroidism, unspecified: Secondary | ICD-10-CM | POA: Diagnosis not present

## 2020-01-31 ENCOUNTER — Other Ambulatory Visit: Payer: Self-pay | Admitting: *Deleted

## 2020-01-31 DIAGNOSIS — Z8679 Personal history of other diseases of the circulatory system: Secondary | ICD-10-CM

## 2020-01-31 MED ORDER — METOPROLOL SUCCINATE ER 50 MG PO TB24: 50.0000 mg | ORAL_TABLET | Freq: Every day | ORAL | 0 refills | Status: DC

## 2020-02-02 ENCOUNTER — Encounter: Payer: Self-pay | Admitting: Family

## 2020-02-02 ENCOUNTER — Ambulatory Visit (INDEPENDENT_AMBULATORY_CARE_PROVIDER_SITE_OTHER): Payer: Medicare Other | Admitting: Family

## 2020-02-02 DIAGNOSIS — R399 Unspecified symptoms and signs involving the genitourinary system: Secondary | ICD-10-CM

## 2020-02-02 DIAGNOSIS — R4182 Altered mental status, unspecified: Secondary | ICD-10-CM

## 2020-02-02 MED ORDER — CEFTRIAXONE SODIUM 1 G IJ SOLR: 1.0000 g | Freq: Once | INTRAMUSCULAR | Status: DC

## 2020-02-02 MED ORDER — CEFTRIAXONE SODIUM 1 G IJ SOLR
1.0000 g | Freq: Once | INTRAMUSCULAR | Status: AC
  Administered 2020-02-02: 1 g via INTRAMUSCULAR

## 2020-02-02 MED ORDER — CIPROFLOXACIN HCL 250 MG PO TABS: 250.0000 mg | ORAL_TABLET | Freq: Two times a day (BID) | ORAL | 0 refills | Status: DC

## 2020-02-02 NOTE — Progress Notes (Signed)
   Virtual Visit via telephone Note Due to COVID-19 pandemic this visit was conducted virtually. This visit type was conducted due to national recommendations for restrictions regarding the COVID-19 Pandemic (e.g. social distancing, sheltering in place) in an effort to limit this patient's exposure and mitigate transmission in our community. All issues noted in this document were discussed and addressed.  A physical exam was not performed with this format.  I connected with Cellie Dardis Flynn's daughter  on 02/02/20 at 1:14 pm by telephone and verified that I am speaking with the correct person using two identifiers. FATMATA LEGERE is currently located at home and daughter  is currently with her during visit. The provider, Evelina Dun, FNP is located in their office at time of visit.  I discussed the limitations, risks, security and privacy concerns of performing an evaluation and management service by telephone and the availability of in person appointments. I also discussed with the patient that there may be a patient responsible charge related to this service. The patient expressed understanding and agreed to proceed.  History and Present Illness:  HPI  Daughter calls the office with complaints of confusion and alter mental status. She had a visit on 01/24/20 and was started on Amoxicillin 500 mg for a UTI. She has completed this medication, but continues to have UTI symptoms. She is having urinary frequency.   Denies any fever, dysuria.   Review of Systems  Unable to perform ROS: Mental status change     Observations/Objective: PT in the background arguing with family, she sounds confused  Assessment and Plan: 1. UTI symptoms - Urine Culture - cefTRIAXone (ROCEPHIN) injection 1 g - ciprofloxacin (CIPRO) 250 MG tablet; Take 1 tablet (250 mg total) by mouth 2 (two) times daily.  Dispense: 10 tablet; Refill: 0  2. Altered mental status, unspecified altered mental status type - Urine  Culture - cefTRIAXone (ROCEPHIN) injection 1 g - ciprofloxacin (CIPRO) 250 MG tablet; Take 1 tablet (250 mg total) by mouth 2 (two) times daily.  Dispense: 10 tablet; Refill: 0  Pt will come in and leave urine We will give her Rocephin injection today. If confusion worsens or she becomes worse go to ED [facility] fluids   I discussed the assessment and treatment plan with the patient. The patient was provided an opportunity to ask questions and all were answered. The patient agreed with the plan and demonstrated an understanding of the instructions.   The patient was advised to call back or seek an in-person evaluation if the symptoms worsen or if the condition fails to improve as anticipated.  The above assessment and management plan was discussed with the patient. The patient verbalized understanding of and has agreed to the management plan. Patient is aware to call the clinic if symptoms persist or worsen. Patient is aware when to return to the clinic for a follow-up visit. Patient educated on when it is appropriate to go to the emergency department.   Time call ended:  1:32 pm  I provided 16 minutes of non-face-to-face time during this encounter.    Evelina Dun, FNP

## 2020-02-02 NOTE — Addendum Note (Signed)
Addended by: Baldomero Lamy B on: 02/02/2020 03:16 PM   Modules accepted: Orders

## 2020-02-05 ENCOUNTER — Telehealth: Payer: Self-pay | Admitting: Family Medicine

## 2020-02-05 ENCOUNTER — Ambulatory Visit (INDEPENDENT_AMBULATORY_CARE_PROVIDER_SITE_OTHER): Payer: Medicare Other | Admitting: Licensed Clinical Social Worker

## 2020-02-05 DIAGNOSIS — I10 Essential (primary) hypertension: Secondary | ICD-10-CM

## 2020-02-05 DIAGNOSIS — K219 Gastro-esophageal reflux disease without esophagitis: Secondary | ICD-10-CM

## 2020-02-05 DIAGNOSIS — M8949 Other hypertrophic osteoarthropathy, multiple sites: Secondary | ICD-10-CM

## 2020-02-05 DIAGNOSIS — Z8679 Personal history of other diseases of the circulatory system: Secondary | ICD-10-CM

## 2020-02-05 DIAGNOSIS — M159 Polyosteoarthritis, unspecified: Secondary | ICD-10-CM

## 2020-02-05 LAB — URINE CULTURE

## 2020-02-05 NOTE — Telephone Encounter (Signed)
Patient can have confusion with an infection and should improve as antibiotic works.

## 2020-02-05 NOTE — Patient Instructions (Addendum)
Licensed Clinical Social Worker Visit Information  Goals we discussed today:  Goals      Client wants to talk with LCSW about her anxiety/stress related to fall potential (pt-stated)      Current Barriers:   At risk for falls  Decreased appetite  Pain issues faced in client with Chronic Diagnoses of  HTN, GERD, OA, and Atrial Fibrillation  Tremor in her hands  Clinical Social Work Clinical Goal(s):   LCSW will talk with clinet and with [name] in next 4 week to discuss client anxiety or stress related to fall potential  Interventions:  Talked withJudy Cox,daughter, about CCM program services  Talked with Bethena Roys about current client needs  Talked withJudy aboutupcoming client appointments  Talked withJudy about pain issues of client  Talked withJudy about appetite of client  Talked withJudy about ambulation issues of client (client uses a walker as needed)  Talked withJudy about physical therapy sessions received by client  LCSW talked withJudy about blood pressure issues of client  Talked with Bethena Roys about sleeping challenges of client  Encouraged Bethena Roys or client to call RNCM to discuss nursing needs of client  Talked with Bethena Roys about client confusion (she said client is confused over some of the rooms in her home; Bethena Roys reported that client may have periodic hallucinations)  Patient Self Care Activities:   Attends all scheduled provider appointments  Patient Self Care Deficits  Unable to self administer medications as prescribed  Needs help with ADLs  Need transport help  Initial Care Plan documentation    Materials Provided: No  Follow Up Plan: LCSW to call client or August Saucer, daughter, in next 4 weeks to talk about anxiety/stress issues of client related to fall potential  The patient /Judy Cox, daughter,verbalized understanding of instructions provided today and declined a print copy of patient instruction materials.   [name].Ginni Eichler MSW,  LCSW Licensed Clinical Social Worker [facility] [phone]

## 2020-02-05 NOTE — Chronic Care Management (AMB) (Signed)
Chronic Care Management    Clinical Social Work Follow Up Note  02/05/2020 Name: Elaine Kennedy MRN: [id number] DOB: 05-Feb-1927  Elaine Kennedy is a 86 y.o. year old female who is a primary care patient of [name], DO. The CCM team was consulted for assistance with [name] .   Review of patient status, including review of consultants reports, other relevant assessments, and collaboration with appropriate care team members and the patient's provider was performed as part of comprehensive patient evaluation and provision of chronic care management services.    SDOH (Social Determinants of Health) assessments performed: No;risk for social isolation; risk for financial strain; risk for depression; risk for physical inactivity    Clinical Support from 11/02/2019 in [place]  PHQ-9 Total Score 7       GAD 7 : Generalized Anxiety Score 09/08/2019  Nervous, Anxious, on Edge 1  Control/stop worrying 1  Worry too much - different things 1  Trouble relaxing 1  Restless 0  Easily annoyed or irritable 0  Afraid - awful might happen 1  Total GAD 7 Score 5  Anxiety Difficulty Somewhat difficult    Outpatient Encounter Medications as of 02/05/2020  Medication Sig  . acetaminophen (TYLENOL) 650 MG CR tablet Take 650 mg by mouth every 8 (eight) hours as needed for pain.   [name] amoxicillin (AMOXIL) 500 MG capsule Take 1 capsule (500 mg total) by mouth 3 (three) times daily.  [name] aspirin EC 81 MG tablet Take 81 mg by mouth daily.  . Baclofen 5 MG TABS Take 5 mg by mouth 3 (three) times daily as needed.  . carbidopa-levodopa (SINEMET IR) 25-100 MG tablet Take 1 tablet by mouth in the morning, at noon, and at bedtime.  . cholecalciferol (VITAMIN D) 1000 units tablet Take 1,000 Units by mouth daily.  . ciprofloxacin (CIPRO) 250 MG tablet Take 1 tablet (250 mg total) by mouth 2 (two) times daily.  . ferrous sulfate 325 (65 FE) MG tablet Take 325 mg by mouth  daily with breakfast.  . levothyroxine (SYNTHROID) 88 MCG tablet TAKE ONE TABLET (88 MCG TOTAL) BY MOUTH DAILY.  . metoprolol succinate (TOPROL-XL) 50 MG 24 hr tablet Take 1 tablet (50 mg total) by mouth daily. Take with or immediately following a meal.  . Multiple Vitamins-Minerals (MULTIVITAMIN WITH MINERALS) tablet Take 1 tablet by mouth daily. Spectra-vite  . omeprazole (PRILOSEC) 40 MG capsule TAKE 1 CAPSULE BY MOUTH EVERY DAY   No facility-administered encounter medications on file as of 02/05/2020.    Goals    .  Client wants to talk with LCSW about her anxiety/stress related to fall potential (pt-stated)      Current Barriers:  . At risk for falls . Decreased appetite . Pain issues faced in client with Chronic Diagnoses of  HTN, GERD, OA, and Atrial Fibrillation . Tremor in her hands  Clinical Social Work Clinical Goal(s):  [name] LCSW will talk with clinet and with Elaine Kennedy in next 4 week to discuss client anxiety or stress related to fall potential  Interventions:  Talked withJudy Kennedy,daughter, about CCM program services  Talked with Elaine Kennedy about current client needs  Talked withJudy aboutupcoming client appointments  Talked withJudy about pain issues of client  Talked withJudy about appetite of client  Talked withJudy about ambulation issues of client (client uses a walker as needed)  Talked withJudy about physical therapy sessions received by client  LCSW talked with Elaine Kennedy about blood pressure issues of  client   Talked with Elaine Kennedy about sleeping challenges of client  Encouraged Elaine Kennedy or client to call RNCM to discuss nursing needs of client  Talked with Elaine Kennedy about client confusion (she said client is confused over some of the rooms in her home; Elaine Kennedy reported that client may have periodic hallucinations)  Patient Self Care Activities:  . Attends all scheduled provider appointments  Patient Self Care Deficits . Unable to self administer medications as  prescribed . Needs help with ADLs . Need transport help  Initial Care Plan documentation    Follow Up Plan: LCSW to call client or Elaine Kennedy, daughter, in next 4 weeks to talk about anxiety/stress issues of client related to fall potential  Elaine Kennedy.Elaine Kennedy MSW, LCSW Licensed Clinical Social Worker [facility] [phone]

## 2020-02-06 ENCOUNTER — Other Ambulatory Visit: Payer: Self-pay

## 2020-02-06 DIAGNOSIS — R399 Unspecified symptoms and signs involving the genitourinary system: Secondary | ICD-10-CM

## 2020-02-06 DIAGNOSIS — I4821 Permanent atrial fibrillation: Secondary | ICD-10-CM | POA: Diagnosis not present

## 2020-02-06 DIAGNOSIS — S62606D Fracture of unspecified phalanx of right little finger, subsequent encounter for fracture with routine healing: Secondary | ICD-10-CM | POA: Diagnosis not present

## 2020-02-06 DIAGNOSIS — I5033 Acute on chronic diastolic (congestive) heart failure: Secondary | ICD-10-CM | POA: Diagnosis not present

## 2020-02-06 DIAGNOSIS — S81811D Laceration without foreign body, right lower leg, subsequent encounter: Secondary | ICD-10-CM | POA: Diagnosis not present

## 2020-02-06 DIAGNOSIS — I110 Hypertensive heart disease with heart failure: Secondary | ICD-10-CM | POA: Diagnosis not present

## 2020-02-06 DIAGNOSIS — E039 Hypothyroidism, unspecified: Secondary | ICD-10-CM | POA: Diagnosis not present

## 2020-02-06 DIAGNOSIS — Z85828 Personal history of other malignant neoplasm of skin: Secondary | ICD-10-CM | POA: Diagnosis not present

## 2020-02-06 DIAGNOSIS — S022XXD Fracture of nasal bones, subsequent encounter for fracture with routine healing: Secondary | ICD-10-CM | POA: Diagnosis not present

## 2020-02-06 DIAGNOSIS — Z9181 History of falling: Secondary | ICD-10-CM | POA: Diagnosis not present

## 2020-02-06 DIAGNOSIS — I872 Venous insufficiency (chronic) (peripheral): Secondary | ICD-10-CM | POA: Diagnosis not present

## 2020-02-06 DIAGNOSIS — Z7982 Long term (current) use of aspirin: Secondary | ICD-10-CM | POA: Diagnosis not present

## 2020-02-06 DIAGNOSIS — M1990 Unspecified osteoarthritis, unspecified site: Secondary | ICD-10-CM | POA: Diagnosis not present

## 2020-02-06 DIAGNOSIS — Z8673 Personal history of transient ischemic attack (TIA), and cerebral infarction without residual deficits: Secondary | ICD-10-CM | POA: Diagnosis not present

## 2020-02-06 DIAGNOSIS — S81812D Laceration without foreign body, left lower leg, subsequent encounter: Secondary | ICD-10-CM | POA: Diagnosis not present

## 2020-02-06 DIAGNOSIS — K219 Gastro-esophageal reflux disease without esophagitis: Secondary | ICD-10-CM | POA: Diagnosis not present

## 2020-02-06 DIAGNOSIS — S0083XD Contusion of other part of head, subsequent encounter: Secondary | ICD-10-CM | POA: Diagnosis not present

## 2020-02-07 DIAGNOSIS — S81811D Laceration without foreign body, right lower leg, subsequent encounter: Secondary | ICD-10-CM | POA: Diagnosis not present

## 2020-02-07 DIAGNOSIS — K219 Gastro-esophageal reflux disease without esophagitis: Secondary | ICD-10-CM | POA: Diagnosis not present

## 2020-02-07 DIAGNOSIS — E039 Hypothyroidism, unspecified: Secondary | ICD-10-CM | POA: Diagnosis not present

## 2020-02-07 DIAGNOSIS — I872 Venous insufficiency (chronic) (peripheral): Secondary | ICD-10-CM | POA: Diagnosis not present

## 2020-02-07 DIAGNOSIS — S62606D Fracture of unspecified phalanx of right little finger, subsequent encounter for fracture with routine healing: Secondary | ICD-10-CM | POA: Diagnosis not present

## 2020-02-07 DIAGNOSIS — I4821 Permanent atrial fibrillation: Secondary | ICD-10-CM | POA: Diagnosis not present

## 2020-02-07 DIAGNOSIS — Z7982 Long term (current) use of aspirin: Secondary | ICD-10-CM | POA: Diagnosis not present

## 2020-02-07 DIAGNOSIS — S0083XD Contusion of other part of head, subsequent encounter: Secondary | ICD-10-CM | POA: Diagnosis not present

## 2020-02-07 DIAGNOSIS — Z85828 Personal history of other malignant neoplasm of skin: Secondary | ICD-10-CM | POA: Diagnosis not present

## 2020-02-07 DIAGNOSIS — S022XXD Fracture of nasal bones, subsequent encounter for fracture with routine healing: Secondary | ICD-10-CM | POA: Diagnosis not present

## 2020-02-07 DIAGNOSIS — S81812D Laceration without foreign body, left lower leg, subsequent encounter: Secondary | ICD-10-CM | POA: Diagnosis not present

## 2020-02-07 DIAGNOSIS — Z8673 Personal history of transient ischemic attack (TIA), and cerebral infarction without residual deficits: Secondary | ICD-10-CM | POA: Diagnosis not present

## 2020-02-07 DIAGNOSIS — M1990 Unspecified osteoarthritis, unspecified site: Secondary | ICD-10-CM | POA: Diagnosis not present

## 2020-02-07 DIAGNOSIS — I110 Hypertensive heart disease with heart failure: Secondary | ICD-10-CM | POA: Diagnosis not present

## 2020-02-07 DIAGNOSIS — I5033 Acute on chronic diastolic (congestive) heart failure: Secondary | ICD-10-CM | POA: Diagnosis not present

## 2020-02-07 DIAGNOSIS — Z9181 History of falling: Secondary | ICD-10-CM | POA: Diagnosis not present

## 2020-02-08 DIAGNOSIS — S62616D Displaced fracture of proximal phalanx of right little finger, subsequent encounter for fracture with routine healing: Secondary | ICD-10-CM | POA: Diagnosis not present

## 2020-02-12 DIAGNOSIS — M170 Bilateral primary osteoarthritis of knee: Secondary | ICD-10-CM | POA: Diagnosis not present

## 2020-02-14 ENCOUNTER — Other Ambulatory Visit: Payer: Medicare Other

## 2020-02-14 DIAGNOSIS — Z7982 Long term (current) use of aspirin: Secondary | ICD-10-CM | POA: Diagnosis not present

## 2020-02-14 DIAGNOSIS — I5033 Acute on chronic diastolic (congestive) heart failure: Secondary | ICD-10-CM | POA: Diagnosis not present

## 2020-02-14 DIAGNOSIS — Z85828 Personal history of other malignant neoplasm of skin: Secondary | ICD-10-CM | POA: Diagnosis not present

## 2020-02-14 DIAGNOSIS — S0083XD Contusion of other part of head, subsequent encounter: Secondary | ICD-10-CM | POA: Diagnosis not present

## 2020-02-14 DIAGNOSIS — S022XXD Fracture of nasal bones, subsequent encounter for fracture with routine healing: Secondary | ICD-10-CM | POA: Diagnosis not present

## 2020-02-14 DIAGNOSIS — S81811D Laceration without foreign body, right lower leg, subsequent encounter: Secondary | ICD-10-CM | POA: Diagnosis not present

## 2020-02-14 DIAGNOSIS — S62606D Fracture of unspecified phalanx of right little finger, subsequent encounter for fracture with routine healing: Secondary | ICD-10-CM | POA: Diagnosis not present

## 2020-02-14 DIAGNOSIS — S81812D Laceration without foreign body, left lower leg, subsequent encounter: Secondary | ICD-10-CM | POA: Diagnosis not present

## 2020-02-14 DIAGNOSIS — I872 Venous insufficiency (chronic) (peripheral): Secondary | ICD-10-CM | POA: Diagnosis not present

## 2020-02-14 DIAGNOSIS — Z9181 History of falling: Secondary | ICD-10-CM | POA: Diagnosis not present

## 2020-02-14 DIAGNOSIS — I110 Hypertensive heart disease with heart failure: Secondary | ICD-10-CM | POA: Diagnosis not present

## 2020-02-14 DIAGNOSIS — M1990 Unspecified osteoarthritis, unspecified site: Secondary | ICD-10-CM | POA: Diagnosis not present

## 2020-02-14 DIAGNOSIS — E039 Hypothyroidism, unspecified: Secondary | ICD-10-CM | POA: Diagnosis not present

## 2020-02-14 DIAGNOSIS — K219 Gastro-esophageal reflux disease without esophagitis: Secondary | ICD-10-CM | POA: Diagnosis not present

## 2020-02-14 DIAGNOSIS — I4821 Permanent atrial fibrillation: Secondary | ICD-10-CM | POA: Diagnosis not present

## 2020-02-14 DIAGNOSIS — R399 Unspecified symptoms and signs involving the genitourinary system: Secondary | ICD-10-CM

## 2020-02-14 DIAGNOSIS — Z8673 Personal history of transient ischemic attack (TIA), and cerebral infarction without residual deficits: Secondary | ICD-10-CM | POA: Diagnosis not present

## 2020-02-14 LAB — URINALYSIS, COMPLETE
Bilirubin, UA: NEGATIVE
Glucose, UA: NEGATIVE
Ketones, UA: NEGATIVE
Nitrite, UA: NEGATIVE
Protein,UA: NEGATIVE
RBC, UA: NEGATIVE
Specific Gravity, UA: 1.01 (ref 1.005–1.030)
Urobilinogen, Ur: 0.2 mg/dL (ref 0.2–1.0)
pH, UA: 6 (ref 5.0–7.5)

## 2020-02-14 LAB — MICROSCOPIC EXAMINATION
RBC, Urine: NONE SEEN /hpf (ref 0–2)
Renal Epithel, UA: NONE SEEN /hpf

## 2020-02-15 ENCOUNTER — Ambulatory Visit: Payer: Medicare Other | Admitting: Physical Therapy

## 2020-02-15 DIAGNOSIS — M6281 Muscle weakness (generalized): Secondary | ICD-10-CM | POA: Diagnosis not present

## 2020-02-15 DIAGNOSIS — Z9181 History of falling: Secondary | ICD-10-CM | POA: Diagnosis not present

## 2020-02-15 LAB — URINE CULTURE

## 2020-02-20 ENCOUNTER — Telehealth: Payer: Self-pay | Admitting: Family Medicine

## 2020-02-20 NOTE — Telephone Encounter (Signed)
Aware. 

## 2020-02-21 DIAGNOSIS — I110 Hypertensive heart disease with heart failure: Secondary | ICD-10-CM | POA: Diagnosis not present

## 2020-02-21 DIAGNOSIS — Z85828 Personal history of other malignant neoplasm of skin: Secondary | ICD-10-CM | POA: Diagnosis not present

## 2020-02-21 DIAGNOSIS — M1990 Unspecified osteoarthritis, unspecified site: Secondary | ICD-10-CM | POA: Diagnosis not present

## 2020-02-21 DIAGNOSIS — I4821 Permanent atrial fibrillation: Secondary | ICD-10-CM | POA: Diagnosis not present

## 2020-02-21 DIAGNOSIS — K219 Gastro-esophageal reflux disease without esophagitis: Secondary | ICD-10-CM | POA: Diagnosis not present

## 2020-02-21 DIAGNOSIS — Z8673 Personal history of transient ischemic attack (TIA), and cerebral infarction without residual deficits: Secondary | ICD-10-CM | POA: Diagnosis not present

## 2020-02-21 DIAGNOSIS — S022XXD Fracture of nasal bones, subsequent encounter for fracture with routine healing: Secondary | ICD-10-CM | POA: Diagnosis not present

## 2020-02-21 DIAGNOSIS — S0083XD Contusion of other part of head, subsequent encounter: Secondary | ICD-10-CM | POA: Diagnosis not present

## 2020-02-21 DIAGNOSIS — Z7982 Long term (current) use of aspirin: Secondary | ICD-10-CM | POA: Diagnosis not present

## 2020-02-21 DIAGNOSIS — I872 Venous insufficiency (chronic) (peripheral): Secondary | ICD-10-CM | POA: Diagnosis not present

## 2020-02-21 DIAGNOSIS — S81812D Laceration without foreign body, left lower leg, subsequent encounter: Secondary | ICD-10-CM | POA: Diagnosis not present

## 2020-02-21 DIAGNOSIS — Z9181 History of falling: Secondary | ICD-10-CM | POA: Diagnosis not present

## 2020-02-21 DIAGNOSIS — I5033 Acute on chronic diastolic (congestive) heart failure: Secondary | ICD-10-CM | POA: Diagnosis not present

## 2020-02-21 DIAGNOSIS — S62606D Fracture of unspecified phalanx of right little finger, subsequent encounter for fracture with routine healing: Secondary | ICD-10-CM | POA: Diagnosis not present

## 2020-02-21 DIAGNOSIS — S81811D Laceration without foreign body, right lower leg, subsequent encounter: Secondary | ICD-10-CM | POA: Diagnosis not present

## 2020-02-21 DIAGNOSIS — E039 Hypothyroidism, unspecified: Secondary | ICD-10-CM | POA: Diagnosis not present

## 2020-02-26 ENCOUNTER — Other Ambulatory Visit: Payer: Self-pay | Admitting: Family Medicine

## 2020-02-29 DIAGNOSIS — E039 Hypothyroidism, unspecified: Secondary | ICD-10-CM | POA: Diagnosis not present

## 2020-02-29 DIAGNOSIS — S81812D Laceration without foreign body, left lower leg, subsequent encounter: Secondary | ICD-10-CM | POA: Diagnosis not present

## 2020-02-29 DIAGNOSIS — Z85828 Personal history of other malignant neoplasm of skin: Secondary | ICD-10-CM | POA: Diagnosis not present

## 2020-02-29 DIAGNOSIS — Z8673 Personal history of transient ischemic attack (TIA), and cerebral infarction without residual deficits: Secondary | ICD-10-CM | POA: Diagnosis not present

## 2020-02-29 DIAGNOSIS — S81811D Laceration without foreign body, right lower leg, subsequent encounter: Secondary | ICD-10-CM | POA: Diagnosis not present

## 2020-02-29 DIAGNOSIS — S0083XD Contusion of other part of head, subsequent encounter: Secondary | ICD-10-CM | POA: Diagnosis not present

## 2020-02-29 DIAGNOSIS — I110 Hypertensive heart disease with heart failure: Secondary | ICD-10-CM | POA: Diagnosis not present

## 2020-02-29 DIAGNOSIS — Z7982 Long term (current) use of aspirin: Secondary | ICD-10-CM | POA: Diagnosis not present

## 2020-02-29 DIAGNOSIS — I872 Venous insufficiency (chronic) (peripheral): Secondary | ICD-10-CM | POA: Diagnosis not present

## 2020-02-29 DIAGNOSIS — K219 Gastro-esophageal reflux disease without esophagitis: Secondary | ICD-10-CM | POA: Diagnosis not present

## 2020-02-29 DIAGNOSIS — S62606D Fracture of unspecified phalanx of right little finger, subsequent encounter for fracture with routine healing: Secondary | ICD-10-CM | POA: Diagnosis not present

## 2020-02-29 DIAGNOSIS — Z9181 History of falling: Secondary | ICD-10-CM | POA: Diagnosis not present

## 2020-02-29 DIAGNOSIS — I4821 Permanent atrial fibrillation: Secondary | ICD-10-CM | POA: Diagnosis not present

## 2020-02-29 DIAGNOSIS — I5033 Acute on chronic diastolic (congestive) heart failure: Secondary | ICD-10-CM | POA: Diagnosis not present

## 2020-02-29 DIAGNOSIS — S022XXD Fracture of nasal bones, subsequent encounter for fracture with routine healing: Secondary | ICD-10-CM | POA: Diagnosis not present

## 2020-02-29 DIAGNOSIS — M1990 Unspecified osteoarthritis, unspecified site: Secondary | ICD-10-CM | POA: Diagnosis not present

## 2020-03-01 ENCOUNTER — Telehealth: Payer: Self-pay | Admitting: Family Medicine

## 2020-03-01 NOTE — Telephone Encounter (Signed)
Elaine Kennedy with [facility] needs an order for home health to do Occupational Therapy 1 x week for 2 weeks.  Please call.

## 2020-03-01 NOTE — Telephone Encounter (Signed)
Verbal orders given  

## 2020-03-05 DIAGNOSIS — S0083XD Contusion of other part of head, subsequent encounter: Secondary | ICD-10-CM | POA: Diagnosis not present

## 2020-03-05 DIAGNOSIS — M1990 Unspecified osteoarthritis, unspecified site: Secondary | ICD-10-CM | POA: Diagnosis not present

## 2020-03-05 DIAGNOSIS — K219 Gastro-esophageal reflux disease without esophagitis: Secondary | ICD-10-CM | POA: Diagnosis not present

## 2020-03-05 DIAGNOSIS — S62606D Fracture of unspecified phalanx of right little finger, subsequent encounter for fracture with routine healing: Secondary | ICD-10-CM | POA: Diagnosis not present

## 2020-03-05 DIAGNOSIS — E039 Hypothyroidism, unspecified: Secondary | ICD-10-CM | POA: Diagnosis not present

## 2020-03-05 DIAGNOSIS — I872 Venous insufficiency (chronic) (peripheral): Secondary | ICD-10-CM | POA: Diagnosis not present

## 2020-03-05 DIAGNOSIS — I110 Hypertensive heart disease with heart failure: Secondary | ICD-10-CM | POA: Diagnosis not present

## 2020-03-05 DIAGNOSIS — S81812D Laceration without foreign body, left lower leg, subsequent encounter: Secondary | ICD-10-CM | POA: Diagnosis not present

## 2020-03-05 DIAGNOSIS — S022XXD Fracture of nasal bones, subsequent encounter for fracture with routine healing: Secondary | ICD-10-CM | POA: Diagnosis not present

## 2020-03-05 DIAGNOSIS — I5033 Acute on chronic diastolic (congestive) heart failure: Secondary | ICD-10-CM | POA: Diagnosis not present

## 2020-03-05 DIAGNOSIS — S81811D Laceration without foreign body, right lower leg, subsequent encounter: Secondary | ICD-10-CM | POA: Diagnosis not present

## 2020-03-05 DIAGNOSIS — Z85828 Personal history of other malignant neoplasm of skin: Secondary | ICD-10-CM | POA: Diagnosis not present

## 2020-03-05 DIAGNOSIS — Z8673 Personal history of transient ischemic attack (TIA), and cerebral infarction without residual deficits: Secondary | ICD-10-CM | POA: Diagnosis not present

## 2020-03-05 DIAGNOSIS — Z7982 Long term (current) use of aspirin: Secondary | ICD-10-CM | POA: Diagnosis not present

## 2020-03-05 DIAGNOSIS — I4821 Permanent atrial fibrillation: Secondary | ICD-10-CM | POA: Diagnosis not present

## 2020-03-05 DIAGNOSIS — Z9181 History of falling: Secondary | ICD-10-CM | POA: Diagnosis not present

## 2020-03-07 DIAGNOSIS — R531 Weakness: Secondary | ICD-10-CM | POA: Diagnosis not present

## 2020-03-12 ENCOUNTER — Ambulatory Visit (INDEPENDENT_AMBULATORY_CARE_PROVIDER_SITE_OTHER): Payer: Medicare Other | Admitting: Licensed Clinical Social Worker

## 2020-03-12 DIAGNOSIS — M159 Polyosteoarthritis, unspecified: Secondary | ICD-10-CM

## 2020-03-12 DIAGNOSIS — I10 Essential (primary) hypertension: Secondary | ICD-10-CM

## 2020-03-12 DIAGNOSIS — M8949 Other hypertrophic osteoarthropathy, multiple sites: Secondary | ICD-10-CM | POA: Diagnosis not present

## 2020-03-12 DIAGNOSIS — Z8679 Personal history of other diseases of the circulatory system: Secondary | ICD-10-CM

## 2020-03-12 DIAGNOSIS — K219 Gastro-esophageal reflux disease without esophagitis: Secondary | ICD-10-CM

## 2020-03-12 NOTE — Chronic Care Management (AMB) (Signed)
Chronic Care Management    Clinical Social Work Follow Up Note  03/12/2020 Name: Elaine Kennedy MRN: [id number] DOB: February 22, 1927  Elaine Kennedy is a 86 y.o. year old female who is a primary care patient of [name], DO. The CCM team was consulted for assistance with [name] .   Review of patient status, including review of consultants reports, other relevant assessments, and collaboration with appropriate care team members and the patient's provider was performed as part of comprehensive patient evaluation and provision of chronic care management services.    SDOH (Social Determinants of Health) assessments performed: No;risk for physical inactivity; risk for depression; risk for stress; risk for financial strain    Clinical Support from 11/02/2019 in [place]  PHQ-9 Total Score 7     GAD 7 : Generalized Anxiety Score 09/08/2019  Nervous, Anxious, on Edge 1  Control/stop worrying 1  Worry too much - different things 1  Trouble relaxing 1  Restless 0  Easily annoyed or irritable 0  Afraid - awful might happen 1  Total GAD 7 Score 5  Anxiety Difficulty Somewhat difficult    Outpatient Encounter Medications as of 03/12/2020  Medication Sig  . acetaminophen (TYLENOL) 650 MG CR tablet Take 650 mg by mouth every 8 (eight) hours as needed for pain.   [name] amoxicillin (AMOXIL) 500 MG capsule Take 1 capsule (500 mg total) by mouth 3 (three) times daily.  [name] aspirin EC 81 MG tablet Take 81 mg by mouth daily.  . Baclofen 5 MG TABS Take 5 mg by mouth 3 (three) times daily as needed.  . carbidopa-levodopa (SINEMET IR) 25-100 MG tablet Take 1 tablet by mouth in the morning, at noon, and at bedtime.  . cholecalciferol (VITAMIN D) 1000 units tablet Take 1,000 Units by mouth daily.  . ciprofloxacin (CIPRO) 250 MG tablet Take 1 tablet (250 mg total) by mouth 2 (two) times daily.  . ferrous sulfate 325 (65 FE) MG tablet Take 325 mg by mouth daily with  breakfast.  . levothyroxine (SYNTHROID) 88 MCG tablet TAKE ONE TABLET (88 MCG TOTAL) BY MOUTH DAILY.  . metoprolol succinate (TOPROL-XL) 50 MG 24 hr tablet Take 1 tablet (50 mg total) by mouth daily. Take with or immediately following a meal.  . Multiple Vitamins-Minerals (MULTIVITAMIN WITH MINERALS) tablet Take 1 tablet by mouth daily. Spectra-vite  . omeprazole (PRILOSEC) 40 MG capsule TAKE 1 CAPSULE BY MOUTH EVERY DAY   No facility-administered encounter medications on file as of 03/12/2020.    Goals    .  Client wants to talk with LCSW about her anxiety/stress related to fall potential (pt-stated)      Current Barriers:  . At risk for falls . Decreased appetite . Pain issues faced in client with Chronic Diagnoses of  HTN, GERD, OA, and Atrial Fibrillation . Tremor in her hands  Clinical Social Work Clinical Goal(s):  [name] LCSW will talk with clinet and with [name] in next 4 week to discuss client anxiety or stress related to fall potential  Interventions:  Talked withJudy Cox,daughter, about CCM program services  Talked with Bethena Roys about current client needs  Talked withJudy aboutupcoming client appointments  Talked withJudy about pain issues of client  Talked withJudy about appetite of client  Talked withJudy about ambulation issues of client (client uses a walker as needed)  Talked withJudy about physical therapy sessions received by client  LCSW talked withJudy about blood pressure issues of client  Talked with  Bethena Roys about sleeping challenges of client  Encouraged Bethena Roys or client to call RNCM to discuss nursing needs of client  Talked with Bethena Roys about client confusion (she said client is confused over some of the rooms in her home; Bethena Roys reported that client may have periodic hallucinations)  Talked with Bethena Roys about relaxation techniques of client (watches TV, listens to music, enjoys visiting with family)  Talked with Bethena Roys about social support network for  client   Patient Self Care Activities:  . Attends all scheduled provider appointments  Patient Self Care Deficits . Unable to self administer medications as prescribed . Needs help with ADLs . Need transport help  Initial Care Plan documentation    Follow Up Plan: LCSW to call client or August Saucer, daughter, in next 4 weeks to talk about anxiety/stress issues of client and to talk about mobility issues of client  [name].Alysen Smylie MSW, LCSW Licensed Clinical Social Worker [facility] [phone]

## 2020-03-12 NOTE — Patient Instructions (Addendum)
Licensed Clinical Social Worker Visit Information  Goals we discussed today:     Client wants to talk with LCSW about her anxiety/stress related to fall potential (pt-stated)         Current Barriers:   At risk for falls  Decreased appetite  Pain issues faced in client with Chronic Diagnoses of  HTN, GERD, OA, and Atrial Fibrillation  Tremor in her hands  Clinical Social Work Clinical Goal(s):   LCSW will talk with clinet and with [name] in next 4 week to discuss client anxiety or stress related to fall potential  Interventions:  Talked withJudy Cox,daughter,about CCM program services  Talked with Bethena Roys about current client needs  Talked withJudyaboutupcoming client appointments  Talked withJudyabout pain issues of client  Talked withJudyabout appetite of client  Talked withJudyabout ambulation issues of client (client uses a walker as needed)  Talked withJudyabout physical therapy sessions received by client  LCSW talked withJudyabout blood pressure issues of client  Talked withJudyabout sleeping challenges of client  Encouraged Bethena Roys or client to call RNCM to discuss nursing needs of client  Talked with Bethena Roys about client confusion (she said client is confused over some of the rooms in her home; Bethena Roys reported that client may have periodic hallucinations)  Talked with Bethena Roys about relaxation techniques of client (watches TV, listens to music, enjoys visiting with family)  Talked with Bethena Roys about social support network for client   Patient Self Care Activities:   Attends all scheduled provider appointments  Patient Self Care Deficits  Unable to self administer medications as prescribed  Needs help with ADLs  Need transport help  Initial Care Plan documentation    Follow Up Plan: LCSW to call client orJudy Cox, daughter,in next 4 weeks to talk about anxiety/stress issues of client and to talk about mobility issues of  client  Materials Provided: No  The patient/Judy Cox, daughter of patient, verbalized understanding of instructions provided today and declined a print copy of patient instruction materials.   [name].Geno Sydnor MSW, LCSW Licensed Clinical Social Worker [facility] [phone]

## 2020-03-14 DIAGNOSIS — E039 Hypothyroidism, unspecified: Secondary | ICD-10-CM | POA: Diagnosis not present

## 2020-03-14 DIAGNOSIS — D649 Anemia, unspecified: Secondary | ICD-10-CM | POA: Diagnosis not present

## 2020-03-14 DIAGNOSIS — Z9181 History of falling: Secondary | ICD-10-CM | POA: Diagnosis not present

## 2020-03-14 DIAGNOSIS — I4821 Permanent atrial fibrillation: Secondary | ICD-10-CM | POA: Diagnosis not present

## 2020-03-14 DIAGNOSIS — S62616D Displaced fracture of proximal phalanx of right little finger, subsequent encounter for fracture with routine healing: Secondary | ICD-10-CM | POA: Diagnosis not present

## 2020-03-14 DIAGNOSIS — K219 Gastro-esophageal reflux disease without esophagitis: Secondary | ICD-10-CM | POA: Diagnosis not present

## 2020-03-14 DIAGNOSIS — E785 Hyperlipidemia, unspecified: Secondary | ICD-10-CM | POA: Diagnosis not present

## 2020-03-14 DIAGNOSIS — S022XXD Fracture of nasal bones, subsequent encounter for fracture with routine healing: Secondary | ICD-10-CM | POA: Diagnosis not present

## 2020-03-14 DIAGNOSIS — I872 Venous insufficiency (chronic) (peripheral): Secondary | ICD-10-CM | POA: Diagnosis not present

## 2020-03-14 DIAGNOSIS — I110 Hypertensive heart disease with heart failure: Secondary | ICD-10-CM | POA: Diagnosis not present

## 2020-03-14 DIAGNOSIS — Z8673 Personal history of transient ischemic attack (TIA), and cerebral infarction without residual deficits: Secondary | ICD-10-CM | POA: Diagnosis not present

## 2020-03-14 DIAGNOSIS — Z85828 Personal history of other malignant neoplasm of skin: Secondary | ICD-10-CM | POA: Diagnosis not present

## 2020-03-14 DIAGNOSIS — I5033 Acute on chronic diastolic (congestive) heart failure: Secondary | ICD-10-CM | POA: Diagnosis not present

## 2020-03-14 DIAGNOSIS — S81811D Laceration without foreign body, right lower leg, subsequent encounter: Secondary | ICD-10-CM | POA: Diagnosis not present

## 2020-03-14 DIAGNOSIS — M170 Bilateral primary osteoarthritis of knee: Secondary | ICD-10-CM | POA: Diagnosis not present

## 2020-03-16 DIAGNOSIS — I4821 Permanent atrial fibrillation: Secondary | ICD-10-CM | POA: Diagnosis not present

## 2020-03-16 DIAGNOSIS — I5033 Acute on chronic diastolic (congestive) heart failure: Secondary | ICD-10-CM | POA: Diagnosis not present

## 2020-03-16 DIAGNOSIS — Z8673 Personal history of transient ischemic attack (TIA), and cerebral infarction without residual deficits: Secondary | ICD-10-CM | POA: Diagnosis not present

## 2020-03-16 DIAGNOSIS — Z9181 History of falling: Secondary | ICD-10-CM | POA: Diagnosis not present

## 2020-03-16 DIAGNOSIS — I110 Hypertensive heart disease with heart failure: Secondary | ICD-10-CM | POA: Diagnosis not present

## 2020-03-16 DIAGNOSIS — M170 Bilateral primary osteoarthritis of knee: Secondary | ICD-10-CM | POA: Diagnosis not present

## 2020-03-16 DIAGNOSIS — D649 Anemia, unspecified: Secondary | ICD-10-CM | POA: Diagnosis not present

## 2020-03-16 DIAGNOSIS — S81811D Laceration without foreign body, right lower leg, subsequent encounter: Secondary | ICD-10-CM | POA: Diagnosis not present

## 2020-03-16 DIAGNOSIS — E039 Hypothyroidism, unspecified: Secondary | ICD-10-CM | POA: Diagnosis not present

## 2020-03-16 DIAGNOSIS — S62616D Displaced fracture of proximal phalanx of right little finger, subsequent encounter for fracture with routine healing: Secondary | ICD-10-CM | POA: Diagnosis not present

## 2020-03-16 DIAGNOSIS — K219 Gastro-esophageal reflux disease without esophagitis: Secondary | ICD-10-CM | POA: Diagnosis not present

## 2020-03-16 DIAGNOSIS — S022XXD Fracture of nasal bones, subsequent encounter for fracture with routine healing: Secondary | ICD-10-CM | POA: Diagnosis not present

## 2020-03-16 DIAGNOSIS — E785 Hyperlipidemia, unspecified: Secondary | ICD-10-CM | POA: Diagnosis not present

## 2020-03-16 DIAGNOSIS — Z85828 Personal history of other malignant neoplasm of skin: Secondary | ICD-10-CM | POA: Diagnosis not present

## 2020-03-16 DIAGNOSIS — I872 Venous insufficiency (chronic) (peripheral): Secondary | ICD-10-CM | POA: Diagnosis not present

## 2020-03-19 ENCOUNTER — Telehealth (INDEPENDENT_AMBULATORY_CARE_PROVIDER_SITE_OTHER): Payer: Medicare Other | Admitting: Family Medicine

## 2020-03-19 ENCOUNTER — Other Ambulatory Visit: Payer: Medicare Other

## 2020-03-19 ENCOUNTER — Other Ambulatory Visit: Payer: Self-pay

## 2020-03-19 DIAGNOSIS — E785 Hyperlipidemia, unspecified: Secondary | ICD-10-CM | POA: Diagnosis not present

## 2020-03-19 DIAGNOSIS — Z8673 Personal history of transient ischemic attack (TIA), and cerebral infarction without residual deficits: Secondary | ICD-10-CM | POA: Diagnosis not present

## 2020-03-19 DIAGNOSIS — S81811D Laceration without foreign body, right lower leg, subsequent encounter: Secondary | ICD-10-CM | POA: Diagnosis not present

## 2020-03-19 DIAGNOSIS — S62616D Displaced fracture of proximal phalanx of right little finger, subsequent encounter for fracture with routine healing: Secondary | ICD-10-CM | POA: Diagnosis not present

## 2020-03-19 DIAGNOSIS — I110 Hypertensive heart disease with heart failure: Secondary | ICD-10-CM | POA: Diagnosis not present

## 2020-03-19 DIAGNOSIS — R410 Disorientation, unspecified: Secondary | ICD-10-CM | POA: Diagnosis not present

## 2020-03-19 DIAGNOSIS — I5033 Acute on chronic diastolic (congestive) heart failure: Secondary | ICD-10-CM | POA: Diagnosis not present

## 2020-03-19 DIAGNOSIS — Z9181 History of falling: Secondary | ICD-10-CM | POA: Diagnosis not present

## 2020-03-19 DIAGNOSIS — E039 Hypothyroidism, unspecified: Secondary | ICD-10-CM | POA: Diagnosis not present

## 2020-03-19 DIAGNOSIS — I4821 Permanent atrial fibrillation: Secondary | ICD-10-CM | POA: Diagnosis not present

## 2020-03-19 DIAGNOSIS — M170 Bilateral primary osteoarthritis of knee: Secondary | ICD-10-CM | POA: Diagnosis not present

## 2020-03-19 DIAGNOSIS — D649 Anemia, unspecified: Secondary | ICD-10-CM | POA: Diagnosis not present

## 2020-03-19 DIAGNOSIS — I872 Venous insufficiency (chronic) (peripheral): Secondary | ICD-10-CM | POA: Diagnosis not present

## 2020-03-19 DIAGNOSIS — K219 Gastro-esophageal reflux disease without esophagitis: Secondary | ICD-10-CM | POA: Diagnosis not present

## 2020-03-19 DIAGNOSIS — S022XXD Fracture of nasal bones, subsequent encounter for fracture with routine healing: Secondary | ICD-10-CM | POA: Diagnosis not present

## 2020-03-19 DIAGNOSIS — Z85828 Personal history of other malignant neoplasm of skin: Secondary | ICD-10-CM | POA: Diagnosis not present

## 2020-03-19 LAB — URINALYSIS, COMPLETE
Bilirubin, UA: NEGATIVE
Glucose, UA: NEGATIVE
Ketones, UA: NEGATIVE
Leukocytes,UA: NEGATIVE
Nitrite, UA: NEGATIVE
Protein,UA: NEGATIVE
Specific Gravity, UA: 1.02 (ref 1.005–1.030)
Urobilinogen, Ur: 0.2 mg/dL (ref 0.2–1.0)
pH, UA: 6.5 (ref 5.0–7.5)

## 2020-03-19 LAB — MICROSCOPIC EXAMINATION
Bacteria, UA: NONE SEEN
Epithelial Cells (non renal): NONE SEEN /hpf (ref 0–10)
RBC, Urine: NONE SEEN /hpf (ref 0–2)
WBC, UA: NONE SEEN /hpf (ref 0–5)

## 2020-03-19 NOTE — Telephone Encounter (Addendum)
Telephone visit  Subjective: CC: ?UTI PCP: Elaine Norlander, DO Elaine Kennedy is a 86 y.o. female calls for telephone consult today. Patient provides verbal consent for consult held via phone.  Due to COVID-19 pandemic this visit was conducted virtually. This visit type was conducted due to national recommendations for restrictions regarding the COVID-19 Pandemic (e.g. social distancing, sheltering in place) in an effort to limit this patient's exposure and mitigate transmission in our community. All issues noted in this document were discussed and addressed.  A physical exam was not performed with this format.   Location of patient: home Location of provider: [facility] Others present for call: granddaughter  1.  Disorientation I reached out to the family after a random urine sample was dropped off to the office.  I cannot find any records as to why this was dropped off.  Her granddaughter reports to me that the previous PCP would have monthly urinalysis performed to evaluate for possible urinary tract infection.  Of note patient has dementia and is disoriented at baseline but her granddaughter does feel that she is slightly more disoriented today.  She was more moody this morning than normal.  She had no fevers, hematuria, nausea, vomiting, change in urinary frequency.  The urinalysis was collected in a sterile fashion.   ROS: Per HPI  Allergies  Allergen Reactions  . Sulfa Antibiotics Diarrhea   Past Medical History:  Diagnosis Date  . Arthritis   . Cancer ([place])    skin cancer on head.  [name] Dysrhythmia    AFib  . GERD (gastroesophageal reflux disease)   . Hypothyroidism     Current Outpatient Medications:  .  acetaminophen (TYLENOL) 650 MG CR tablet, Take 650 mg by mouth every 8 (eight) hours as needed for pain. , Disp: , Rfl:  .  amoxicillin (AMOXIL) 500 MG capsule, Take 1 capsule (500 mg total) by mouth 3 (three) times daily., Disp: 21 capsule, Rfl: 0 .  aspirin EC 81 MG  tablet, Take 81 mg by mouth daily., Disp: , Rfl:  .  Baclofen 5 MG TABS, Take 5 mg by mouth 3 (three) times daily as needed., Disp: 30 tablet, Rfl: 0 .  carbidopa-levodopa (SINEMET IR) 25-100 MG tablet, Take 1 tablet by mouth in the morning, at noon, and at bedtime., Disp: , Rfl:  .  cholecalciferol (VITAMIN D) 1000 units tablet, Take 1,000 Units by mouth daily., Disp: , Rfl:  .  ciprofloxacin (CIPRO) 250 MG tablet, Take 1 tablet (250 mg total) by mouth 2 (two) times daily., Disp: 10 tablet, Rfl: 0 .  ferrous sulfate 325 (65 FE) MG tablet, Take 325 mg by mouth daily with breakfast., Disp: , Rfl:  .  levothyroxine (SYNTHROID) 88 MCG tablet, TAKE ONE TABLET (88 MCG TOTAL) BY MOUTH DAILY., Disp: 90 tablet, Rfl: 1 .  metoprolol succinate (TOPROL-XL) 50 MG 24 hr tablet, Take 1 tablet (50 mg total) by mouth daily. Take with or immediately following a meal., Disp: 90 tablet, Rfl: 0 .  Multiple Vitamins-Minerals (MULTIVITAMIN WITH MINERALS) tablet, Take 1 tablet by mouth daily. Spectra-vite, Disp: , Rfl:  .  omeprazole (PRILOSEC) 40 MG capsule, TAKE 1 CAPSULE BY MOUTH EVERY DAY, Disp: 90 capsule, Rfl: 1  Assessment/ Plan: 86 y.o. female   Disorientation - Plan: Urinalysis, Complete, Urine Culture  UA and UCx ordered.  I reviewed with her family member at length that simply dropping urinalysis off is inappropriate.  She needs to have some type of telephone call or  scheduled visit because I would never treat for UTI simply based on her urinalysis.  It really needs to be in context of what ever symptomology she may be having.  The elderly at baseline tend to be colonized.  Frequently using antibiotics for bacteriuria in a demented patient who is disoriented at baseline is inappropriate and unfortunately very risky to the patient.  Personal review of urine NEGATIVE for infection.  Start time: 3:03pm End time: 3:06pm  Total time spent on patient care (including telephone call/ virtual visit): 7  minutes  Elaine Kennedy, [place] [phone]

## 2020-03-21 DIAGNOSIS — S62616D Displaced fracture of proximal phalanx of right little finger, subsequent encounter for fracture with routine healing: Secondary | ICD-10-CM | POA: Diagnosis not present

## 2020-03-21 DIAGNOSIS — S81811D Laceration without foreign body, right lower leg, subsequent encounter: Secondary | ICD-10-CM | POA: Diagnosis not present

## 2020-03-21 DIAGNOSIS — E039 Hypothyroidism, unspecified: Secondary | ICD-10-CM | POA: Diagnosis not present

## 2020-03-21 DIAGNOSIS — I872 Venous insufficiency (chronic) (peripheral): Secondary | ICD-10-CM | POA: Diagnosis not present

## 2020-03-21 DIAGNOSIS — I5033 Acute on chronic diastolic (congestive) heart failure: Secondary | ICD-10-CM | POA: Diagnosis not present

## 2020-03-21 DIAGNOSIS — M170 Bilateral primary osteoarthritis of knee: Secondary | ICD-10-CM | POA: Diagnosis not present

## 2020-03-21 DIAGNOSIS — D649 Anemia, unspecified: Secondary | ICD-10-CM | POA: Diagnosis not present

## 2020-03-21 DIAGNOSIS — I110 Hypertensive heart disease with heart failure: Secondary | ICD-10-CM | POA: Diagnosis not present

## 2020-03-21 DIAGNOSIS — Z8673 Personal history of transient ischemic attack (TIA), and cerebral infarction without residual deficits: Secondary | ICD-10-CM | POA: Diagnosis not present

## 2020-03-21 DIAGNOSIS — Z85828 Personal history of other malignant neoplasm of skin: Secondary | ICD-10-CM | POA: Diagnosis not present

## 2020-03-21 DIAGNOSIS — Z9181 History of falling: Secondary | ICD-10-CM | POA: Diagnosis not present

## 2020-03-21 DIAGNOSIS — S022XXD Fracture of nasal bones, subsequent encounter for fracture with routine healing: Secondary | ICD-10-CM | POA: Diagnosis not present

## 2020-03-21 DIAGNOSIS — I4821 Permanent atrial fibrillation: Secondary | ICD-10-CM | POA: Diagnosis not present

## 2020-03-21 DIAGNOSIS — K219 Gastro-esophageal reflux disease without esophagitis: Secondary | ICD-10-CM | POA: Diagnosis not present

## 2020-03-21 DIAGNOSIS — E785 Hyperlipidemia, unspecified: Secondary | ICD-10-CM | POA: Diagnosis not present

## 2020-03-21 LAB — URINE CULTURE: Organism ID, Bacteria: NO GROWTH

## 2020-03-26 DIAGNOSIS — I4821 Permanent atrial fibrillation: Secondary | ICD-10-CM | POA: Diagnosis not present

## 2020-03-26 DIAGNOSIS — I5033 Acute on chronic diastolic (congestive) heart failure: Secondary | ICD-10-CM | POA: Diagnosis not present

## 2020-03-26 DIAGNOSIS — K219 Gastro-esophageal reflux disease without esophagitis: Secondary | ICD-10-CM | POA: Diagnosis not present

## 2020-03-26 DIAGNOSIS — Z8673 Personal history of transient ischemic attack (TIA), and cerebral infarction without residual deficits: Secondary | ICD-10-CM | POA: Diagnosis not present

## 2020-03-26 DIAGNOSIS — I110 Hypertensive heart disease with heart failure: Secondary | ICD-10-CM | POA: Diagnosis not present

## 2020-03-26 DIAGNOSIS — Z85828 Personal history of other malignant neoplasm of skin: Secondary | ICD-10-CM | POA: Diagnosis not present

## 2020-03-26 DIAGNOSIS — S81811D Laceration without foreign body, right lower leg, subsequent encounter: Secondary | ICD-10-CM | POA: Diagnosis not present

## 2020-03-26 DIAGNOSIS — S022XXD Fracture of nasal bones, subsequent encounter for fracture with routine healing: Secondary | ICD-10-CM | POA: Diagnosis not present

## 2020-03-26 DIAGNOSIS — M170 Bilateral primary osteoarthritis of knee: Secondary | ICD-10-CM | POA: Diagnosis not present

## 2020-03-26 DIAGNOSIS — S62616D Displaced fracture of proximal phalanx of right little finger, subsequent encounter for fracture with routine healing: Secondary | ICD-10-CM | POA: Diagnosis not present

## 2020-03-26 DIAGNOSIS — E039 Hypothyroidism, unspecified: Secondary | ICD-10-CM | POA: Diagnosis not present

## 2020-03-26 DIAGNOSIS — D649 Anemia, unspecified: Secondary | ICD-10-CM | POA: Diagnosis not present

## 2020-03-26 DIAGNOSIS — E785 Hyperlipidemia, unspecified: Secondary | ICD-10-CM | POA: Diagnosis not present

## 2020-03-26 DIAGNOSIS — I872 Venous insufficiency (chronic) (peripheral): Secondary | ICD-10-CM | POA: Diagnosis not present

## 2020-03-26 DIAGNOSIS — Z9181 History of falling: Secondary | ICD-10-CM | POA: Diagnosis not present

## 2020-03-27 DIAGNOSIS — I5033 Acute on chronic diastolic (congestive) heart failure: Secondary | ICD-10-CM | POA: Diagnosis not present

## 2020-03-27 DIAGNOSIS — I872 Venous insufficiency (chronic) (peripheral): Secondary | ICD-10-CM | POA: Diagnosis not present

## 2020-03-27 DIAGNOSIS — S81811D Laceration without foreign body, right lower leg, subsequent encounter: Secondary | ICD-10-CM | POA: Diagnosis not present

## 2020-03-27 DIAGNOSIS — Z9181 History of falling: Secondary | ICD-10-CM | POA: Diagnosis not present

## 2020-03-27 DIAGNOSIS — K219 Gastro-esophageal reflux disease without esophagitis: Secondary | ICD-10-CM | POA: Diagnosis not present

## 2020-03-27 DIAGNOSIS — I4821 Permanent atrial fibrillation: Secondary | ICD-10-CM | POA: Diagnosis not present

## 2020-03-27 DIAGNOSIS — E039 Hypothyroidism, unspecified: Secondary | ICD-10-CM | POA: Diagnosis not present

## 2020-03-27 DIAGNOSIS — Z8673 Personal history of transient ischemic attack (TIA), and cerebral infarction without residual deficits: Secondary | ICD-10-CM | POA: Diagnosis not present

## 2020-03-27 DIAGNOSIS — S022XXD Fracture of nasal bones, subsequent encounter for fracture with routine healing: Secondary | ICD-10-CM | POA: Diagnosis not present

## 2020-03-27 DIAGNOSIS — E785 Hyperlipidemia, unspecified: Secondary | ICD-10-CM | POA: Diagnosis not present

## 2020-03-27 DIAGNOSIS — D649 Anemia, unspecified: Secondary | ICD-10-CM | POA: Diagnosis not present

## 2020-03-27 DIAGNOSIS — S62616D Displaced fracture of proximal phalanx of right little finger, subsequent encounter for fracture with routine healing: Secondary | ICD-10-CM | POA: Diagnosis not present

## 2020-03-27 DIAGNOSIS — I110 Hypertensive heart disease with heart failure: Secondary | ICD-10-CM | POA: Diagnosis not present

## 2020-03-27 DIAGNOSIS — Z85828 Personal history of other malignant neoplasm of skin: Secondary | ICD-10-CM | POA: Diagnosis not present

## 2020-03-27 DIAGNOSIS — M170 Bilateral primary osteoarthritis of knee: Secondary | ICD-10-CM | POA: Diagnosis not present

## 2020-03-28 ENCOUNTER — Other Ambulatory Visit: Payer: Self-pay

## 2020-03-28 ENCOUNTER — Ambulatory Visit (INDEPENDENT_AMBULATORY_CARE_PROVIDER_SITE_OTHER): Payer: Medicare Other

## 2020-03-28 DIAGNOSIS — E039 Hypothyroidism, unspecified: Secondary | ICD-10-CM

## 2020-03-28 DIAGNOSIS — I4821 Permanent atrial fibrillation: Secondary | ICD-10-CM | POA: Diagnosis not present

## 2020-03-28 DIAGNOSIS — S81811D Laceration without foreign body, right lower leg, subsequent encounter: Secondary | ICD-10-CM | POA: Diagnosis not present

## 2020-03-28 DIAGNOSIS — F329 Major depressive disorder, single episode, unspecified: Secondary | ICD-10-CM

## 2020-03-28 DIAGNOSIS — I5033 Acute on chronic diastolic (congestive) heart failure: Secondary | ICD-10-CM

## 2020-03-28 DIAGNOSIS — M170 Bilateral primary osteoarthritis of knee: Secondary | ICD-10-CM

## 2020-03-28 DIAGNOSIS — S62616D Displaced fracture of proximal phalanx of right little finger, subsequent encounter for fracture with routine healing: Secondary | ICD-10-CM

## 2020-03-28 DIAGNOSIS — F419 Anxiety disorder, unspecified: Secondary | ICD-10-CM

## 2020-03-28 DIAGNOSIS — Z8673 Personal history of transient ischemic attack (TIA), and cerebral infarction without residual deficits: Secondary | ICD-10-CM

## 2020-03-28 DIAGNOSIS — K219 Gastro-esophageal reflux disease without esophagitis: Secondary | ICD-10-CM

## 2020-03-28 DIAGNOSIS — I110 Hypertensive heart disease with heart failure: Secondary | ICD-10-CM

## 2020-03-28 DIAGNOSIS — Z85828 Personal history of other malignant neoplasm of skin: Secondary | ICD-10-CM

## 2020-03-28 DIAGNOSIS — F0280 Dementia in other diseases classified elsewhere without behavioral disturbance: Secondary | ICD-10-CM

## 2020-03-28 DIAGNOSIS — D649 Anemia, unspecified: Secondary | ICD-10-CM

## 2020-03-28 DIAGNOSIS — Z9181 History of falling: Secondary | ICD-10-CM

## 2020-03-28 DIAGNOSIS — I872 Venous insufficiency (chronic) (peripheral): Secondary | ICD-10-CM

## 2020-03-28 DIAGNOSIS — E785 Hyperlipidemia, unspecified: Secondary | ICD-10-CM

## 2020-03-28 DIAGNOSIS — S022XXD Fracture of nasal bones, subsequent encounter for fracture with routine healing: Secondary | ICD-10-CM

## 2020-04-01 DIAGNOSIS — Z79899 Other long term (current) drug therapy: Secondary | ICD-10-CM | POA: Diagnosis not present

## 2020-04-01 DIAGNOSIS — R296 Repeated falls: Secondary | ICD-10-CM | POA: Diagnosis not present

## 2020-04-01 DIAGNOSIS — K117 Disturbances of salivary secretion: Secondary | ICD-10-CM | POA: Diagnosis not present

## 2020-04-01 DIAGNOSIS — G20 Parkinson's disease: Secondary | ICD-10-CM | POA: Diagnosis not present

## 2020-04-03 DIAGNOSIS — S81811D Laceration without foreign body, right lower leg, subsequent encounter: Secondary | ICD-10-CM | POA: Diagnosis not present

## 2020-04-03 DIAGNOSIS — I4821 Permanent atrial fibrillation: Secondary | ICD-10-CM | POA: Diagnosis not present

## 2020-04-03 DIAGNOSIS — E039 Hypothyroidism, unspecified: Secondary | ICD-10-CM | POA: Diagnosis not present

## 2020-04-03 DIAGNOSIS — I110 Hypertensive heart disease with heart failure: Secondary | ICD-10-CM | POA: Diagnosis not present

## 2020-04-03 DIAGNOSIS — Z85828 Personal history of other malignant neoplasm of skin: Secondary | ICD-10-CM | POA: Diagnosis not present

## 2020-04-03 DIAGNOSIS — I872 Venous insufficiency (chronic) (peripheral): Secondary | ICD-10-CM | POA: Diagnosis not present

## 2020-04-03 DIAGNOSIS — E785 Hyperlipidemia, unspecified: Secondary | ICD-10-CM | POA: Diagnosis not present

## 2020-04-03 DIAGNOSIS — S022XXD Fracture of nasal bones, subsequent encounter for fracture with routine healing: Secondary | ICD-10-CM | POA: Diagnosis not present

## 2020-04-03 DIAGNOSIS — Z9181 History of falling: Secondary | ICD-10-CM | POA: Diagnosis not present

## 2020-04-03 DIAGNOSIS — M170 Bilateral primary osteoarthritis of knee: Secondary | ICD-10-CM | POA: Diagnosis not present

## 2020-04-03 DIAGNOSIS — S62616D Displaced fracture of proximal phalanx of right little finger, subsequent encounter for fracture with routine healing: Secondary | ICD-10-CM | POA: Diagnosis not present

## 2020-04-03 DIAGNOSIS — D649 Anemia, unspecified: Secondary | ICD-10-CM | POA: Diagnosis not present

## 2020-04-03 DIAGNOSIS — K219 Gastro-esophageal reflux disease without esophagitis: Secondary | ICD-10-CM | POA: Diagnosis not present

## 2020-04-03 DIAGNOSIS — Z8673 Personal history of transient ischemic attack (TIA), and cerebral infarction without residual deficits: Secondary | ICD-10-CM | POA: Diagnosis not present

## 2020-04-03 DIAGNOSIS — I5033 Acute on chronic diastolic (congestive) heart failure: Secondary | ICD-10-CM | POA: Diagnosis not present

## 2020-04-04 DIAGNOSIS — D649 Anemia, unspecified: Secondary | ICD-10-CM | POA: Diagnosis not present

## 2020-04-04 DIAGNOSIS — I110 Hypertensive heart disease with heart failure: Secondary | ICD-10-CM | POA: Diagnosis not present

## 2020-04-04 DIAGNOSIS — E039 Hypothyroidism, unspecified: Secondary | ICD-10-CM | POA: Diagnosis not present

## 2020-04-04 DIAGNOSIS — Z8673 Personal history of transient ischemic attack (TIA), and cerebral infarction without residual deficits: Secondary | ICD-10-CM | POA: Diagnosis not present

## 2020-04-04 DIAGNOSIS — K219 Gastro-esophageal reflux disease without esophagitis: Secondary | ICD-10-CM | POA: Diagnosis not present

## 2020-04-04 DIAGNOSIS — I5033 Acute on chronic diastolic (congestive) heart failure: Secondary | ICD-10-CM | POA: Diagnosis not present

## 2020-04-04 DIAGNOSIS — Z85828 Personal history of other malignant neoplasm of skin: Secondary | ICD-10-CM | POA: Diagnosis not present

## 2020-04-04 DIAGNOSIS — I4821 Permanent atrial fibrillation: Secondary | ICD-10-CM | POA: Diagnosis not present

## 2020-04-04 DIAGNOSIS — S81811D Laceration without foreign body, right lower leg, subsequent encounter: Secondary | ICD-10-CM | POA: Diagnosis not present

## 2020-04-04 DIAGNOSIS — E785 Hyperlipidemia, unspecified: Secondary | ICD-10-CM | POA: Diagnosis not present

## 2020-04-04 DIAGNOSIS — S62616D Displaced fracture of proximal phalanx of right little finger, subsequent encounter for fracture with routine healing: Secondary | ICD-10-CM | POA: Diagnosis not present

## 2020-04-04 DIAGNOSIS — S022XXD Fracture of nasal bones, subsequent encounter for fracture with routine healing: Secondary | ICD-10-CM | POA: Diagnosis not present

## 2020-04-04 DIAGNOSIS — Z9181 History of falling: Secondary | ICD-10-CM | POA: Diagnosis not present

## 2020-04-04 DIAGNOSIS — I872 Venous insufficiency (chronic) (peripheral): Secondary | ICD-10-CM | POA: Diagnosis not present

## 2020-04-04 DIAGNOSIS — M170 Bilateral primary osteoarthritis of knee: Secondary | ICD-10-CM | POA: Diagnosis not present

## 2020-04-09 ENCOUNTER — Ambulatory Visit (INDEPENDENT_AMBULATORY_CARE_PROVIDER_SITE_OTHER): Payer: Medicare Other | Admitting: *Deleted

## 2020-04-09 DIAGNOSIS — E039 Hypothyroidism, unspecified: Secondary | ICD-10-CM

## 2020-04-09 DIAGNOSIS — I10 Essential (primary) hypertension: Secondary | ICD-10-CM | POA: Diagnosis not present

## 2020-04-09 DIAGNOSIS — R410 Disorientation, unspecified: Secondary | ICD-10-CM

## 2020-04-09 DIAGNOSIS — F0391 Unspecified dementia with behavioral disturbance: Secondary | ICD-10-CM

## 2020-04-09 DIAGNOSIS — M6281 Muscle weakness (generalized): Secondary | ICD-10-CM

## 2020-04-09 DIAGNOSIS — M8949 Other hypertrophic osteoarthropathy, multiple sites: Secondary | ICD-10-CM | POA: Diagnosis not present

## 2020-04-09 NOTE — Patient Instructions (Signed)
Visit Information  Goals Addressed              This Visit's Progress     Patient Stated   .  "Mom needs 24 hour supervision" (pt-stated)        CARE PLAN ENTRY (see longitudinal plan of care for additional care plan information)  Current Barriers:  . Care Coordination needs related to 24 hour supervision in a patient with dementia and frequent falls (disease states)  Nurse Case Manager Clinical Goal(s):  [name] Over the next 30 days, patient will work with PCP to address needs related to 24 hour supervision  Interventions:  . Inter-disciplinary care team collaboration (see longitudinal plan of care) . Chart reviewed including recent office and telephone notes . Discussed mobility and ADLs . Discussed that family is providing support right now . Discussed no falls since last visit . Discussed patient's compliance with assistance . Discussed diet and appetite o Receiving meals on wheels. Eats when it's something that she likes.  . Discussed water/liquid intake o Family is encouraging her to drink throughout the day . Discussed memory loss and disorientation . Discussed safety concerns . Encouraged family to reach out to [facility] team and PCP as needed  Patient Self Care Activities:  . Depends on family for assistance with ADLs and IADLs  Please see past updates related to this goal by clicking on the "Past Updates" button in the selected goal        Other   .  "We don't want her to fall"        Current Barriers:  [name] Knowledge Deficits related to fall precautions . Decreased adherence to prescribed treatment for fall prevention . Patient is uncooperative with safety measures  Nurse Case Manager Clinical Goal(s):  [name] Over the next 30 days, family will assist patient to prevent falls . Over the next 90 days, patient will not experience any additional falls  Interventions:  . Chart reviewed including recent office notes and telephone calls . Talked with family by telephone who  reports that Ms Bracken has not had any falls since her last visit . Discussed mobility and ADLs . Discussed that she uses a walker for ambulation and that she sometimes gets disoriented in her home. Family assists her.  . Discussed patient's compliance with assistance and assistive devices . Discussed fall prevention . Encouraged patient/family to reach out to [facility] team as needed  Patient Self Care Activities:  . Patient does not perform ADLs or IADls independently     Please see past updates related to this goal by clicking on the "Past Updates" button in the selected goal         Patient verbalizes understanding of instructions provided today.   Follow-up Plan The care management team will reach out to the patient again over the next 30 days.   Chong Sicilian, BSN, RN-BC Embedded Chronic Care Manager [facility] Direct Dial: [phone]

## 2020-04-09 NOTE — Chronic Care Management (AMB) (Signed)
Chronic Care Management   Follow Up Note   04/09/2020 Name: Elaine Kennedy MRN: [id number] DOB: May 13, 1927  Referred by: [name], DO Reason for referral : Chronic Care Management (RN follow up)   Elaine Kennedy is a 86 y.o. year old female who is a primary care patient of [name], DO. The CCM team was consulted for assistance with chronic disease management and care coordination needs.    Review of patient status, including review of consultants reports, relevant laboratory and other test results, and collaboration with appropriate care team members and the patient's provider was performed as part of comprehensive patient evaluation and provision of chronic care management services.    SDOH (Social Determinants of Health) assessments performed: No See Care Plan activities for detailed interventions related to Elaine Kennedy)     Outpatient Encounter Medications as of 04/09/2020  Medication Sig  . acetaminophen (TYLENOL) 650 MG CR tablet Take 650 mg by mouth every 8 (eight) hours as needed for pain.   [name] amoxicillin (AMOXIL) 500 MG capsule Take 1 capsule (500 mg total) by mouth 3 (three) times daily.  [name] aspirin EC 81 MG tablet Take 81 mg by mouth daily.  . Baclofen 5 MG TABS Take 5 mg by mouth 3 (three) times daily as needed.  . carbidopa-levodopa (SINEMET IR) 25-100 MG tablet Take 1 tablet by mouth in the morning, at noon, and at bedtime.  . cholecalciferol (VITAMIN D) 1000 units tablet Take 1,000 Units by mouth daily.  . ciprofloxacin (CIPRO) 250 MG tablet Take 1 tablet (250 mg total) by mouth 2 (two) times daily.  . ferrous sulfate 325 (65 FE) MG tablet Take 325 mg by mouth daily with breakfast.  . levothyroxine (SYNTHROID) 88 MCG tablet TAKE ONE TABLET (88 MCG TOTAL) BY MOUTH DAILY.  . metoprolol succinate (TOPROL-XL) 50 MG 24 hr tablet Take 1 tablet (50 mg total) by mouth daily. Take with or immediately following a meal.  . Multiple Vitamins-Minerals (MULTIVITAMIN WITH  MINERALS) tablet Take 1 tablet by mouth daily. Spectra-vite  . omeprazole (PRILOSEC) 40 MG capsule TAKE 1 CAPSULE BY MOUTH EVERY DAY   No facility-administered encounter medications on file as of 04/09/2020.     RN Care Plan             This Visit's Progress     Patient Stated   .  "Mom needs 24 hour supervision" (pt-stated)        CARE PLAN ENTRY (see longitudinal plan of care for additional care plan information)  Current Barriers:  . Care Coordination needs related to 24 hour supervision in a patient with dementia and frequent falls (disease states)  Nurse Case Manager Clinical Goal(s):  [name] Over the next 30 days, patient will work with PCP to address needs related to 24 hour supervision  Interventions:  . Inter-disciplinary care team collaboration (see longitudinal plan of care) . Chart reviewed including recent office and telephone notes . Discussed mobility and ADLs . Discussed that family is providing support right now . Discussed no falls since last visit . Discussed patient's compliance with assistance . Discussed diet and appetite o Receiving meals on wheels. Eats when it's something that she likes.  . Discussed water/liquid intake o Family is encouraging her to drink throughout the day . Discussed memory loss and disorientation . Discussed safety concerns . Encouraged family to reach out to [facility] team and PCP as needed  Patient Self Care Activities:  . Depends on family for assistance  with ADLs and IADLs  Please see past updates related to this goal by clicking on the "Past Updates" button in the selected goal        Other   .  "We don't want her to fall"        Current Barriers:  [name] Knowledge Deficits related to fall precautions . Decreased adherence to prescribed treatment for fall prevention . Patient is uncooperative with safety measures  Nurse Case Manager Clinical Goal(s):  [name] Over the next 30 days, family will assist patient to prevent falls . Over  the next 90 days, patient will not experience any additional falls  Interventions:  . Chart reviewed including recent office notes and telephone calls . Talked with family by telephone who reports that Ms Ogando has not had any falls since her last visit . Discussed mobility and ADLs . Discussed that she uses a walker for ambulation and that she sometimes gets disoriented in her home. Family assists her.  . Discussed patient's compliance with assistance and assistive devices . Discussed fall prevention . Encouraged patient/family to reach out to [facility] team as needed  Patient Self Care Activities:  . Patient does not perform ADLs or IADls independently     Please see past updates related to this goal by clicking on the "Past Updates" button in the selected goal          Plan:   The care management team will reach out to the patient again over the next 30 days.    Chong Sicilian, BSN, RN-BC Embedded Chronic Care Manager [facility] [place] [facility] Direct Dial: [phone]

## 2020-04-10 DIAGNOSIS — I110 Hypertensive heart disease with heart failure: Secondary | ICD-10-CM | POA: Diagnosis not present

## 2020-04-10 DIAGNOSIS — S022XXD Fracture of nasal bones, subsequent encounter for fracture with routine healing: Secondary | ICD-10-CM | POA: Diagnosis not present

## 2020-04-10 DIAGNOSIS — S62616D Displaced fracture of proximal phalanx of right little finger, subsequent encounter for fracture with routine healing: Secondary | ICD-10-CM | POA: Diagnosis not present

## 2020-04-10 DIAGNOSIS — D649 Anemia, unspecified: Secondary | ICD-10-CM | POA: Diagnosis not present

## 2020-04-10 DIAGNOSIS — K219 Gastro-esophageal reflux disease without esophagitis: Secondary | ICD-10-CM | POA: Diagnosis not present

## 2020-04-10 DIAGNOSIS — I5033 Acute on chronic diastolic (congestive) heart failure: Secondary | ICD-10-CM | POA: Diagnosis not present

## 2020-04-10 DIAGNOSIS — Z85828 Personal history of other malignant neoplasm of skin: Secondary | ICD-10-CM | POA: Diagnosis not present

## 2020-04-10 DIAGNOSIS — Z9181 History of falling: Secondary | ICD-10-CM | POA: Diagnosis not present

## 2020-04-10 DIAGNOSIS — M170 Bilateral primary osteoarthritis of knee: Secondary | ICD-10-CM | POA: Diagnosis not present

## 2020-04-10 DIAGNOSIS — S81811D Laceration without foreign body, right lower leg, subsequent encounter: Secondary | ICD-10-CM | POA: Diagnosis not present

## 2020-04-10 DIAGNOSIS — E039 Hypothyroidism, unspecified: Secondary | ICD-10-CM | POA: Diagnosis not present

## 2020-04-10 DIAGNOSIS — I4821 Permanent atrial fibrillation: Secondary | ICD-10-CM | POA: Diagnosis not present

## 2020-04-10 DIAGNOSIS — E785 Hyperlipidemia, unspecified: Secondary | ICD-10-CM | POA: Diagnosis not present

## 2020-04-10 DIAGNOSIS — I872 Venous insufficiency (chronic) (peripheral): Secondary | ICD-10-CM | POA: Diagnosis not present

## 2020-04-10 DIAGNOSIS — Z8673 Personal history of transient ischemic attack (TIA), and cerebral infarction without residual deficits: Secondary | ICD-10-CM | POA: Diagnosis not present

## 2020-04-13 DIAGNOSIS — W19XXXA Unspecified fall, initial encounter: Secondary | ICD-10-CM | POA: Diagnosis not present

## 2020-04-15 ENCOUNTER — Ambulatory Visit: Payer: Medicare Other | Admitting: Licensed Clinical Social Worker

## 2020-04-15 ENCOUNTER — Other Ambulatory Visit: Payer: Self-pay

## 2020-04-15 DIAGNOSIS — K219 Gastro-esophageal reflux disease without esophagitis: Secondary | ICD-10-CM

## 2020-04-15 DIAGNOSIS — Z8679 Personal history of other diseases of the circulatory system: Secondary | ICD-10-CM

## 2020-04-15 DIAGNOSIS — I10 Essential (primary) hypertension: Secondary | ICD-10-CM

## 2020-04-15 DIAGNOSIS — M8949 Other hypertrophic osteoarthropathy, multiple sites: Secondary | ICD-10-CM | POA: Diagnosis not present

## 2020-04-15 DIAGNOSIS — E039 Hypothyroidism, unspecified: Secondary | ICD-10-CM | POA: Diagnosis not present

## 2020-04-15 DIAGNOSIS — M159 Polyosteoarthritis, unspecified: Secondary | ICD-10-CM

## 2020-04-15 MED ORDER — OMEPRAZOLE 40 MG PO CPDR: DELAYED_RELEASE_CAPSULE | ORAL | 0 refills | Status: AC

## 2020-04-15 NOTE — Chronic Care Management (AMB) (Signed)
Chronic Care Management    Clinical Social Work Follow Up Note  04/15/2020 Name: Elaine Kennedy MRN: [id number] DOB: 02/19/1927  Elaine Kennedy is a 86 y.o. year old female who is a primary care patient of [name], DO. The CCM team was consulted for assistance with [name] .   Review of patient status, including review of consultants reports, other relevant assessments, and collaboration with appropriate care team members and the patient's provider was performed as part of comprehensive patient evaluation and provision of chronic care management services.    SDOH (Social Determinants of Health) assessments performed: No;risk for physical inactivity; risk for stress; risk for depression; risk for tobacco use    Clinical Support from 11/02/2019 in [place]  PHQ-9 Total Score 7       GAD 7 : Generalized Anxiety Score 09/08/2019  Nervous, Anxious, on Edge 1  Control/stop worrying 1  Worry too much - different things 1  Trouble relaxing 1  Restless 0  Easily annoyed or irritable 0  Afraid - awful might happen 1  Total GAD 7 Score 5  Anxiety Difficulty Somewhat difficult    Outpatient Encounter Medications as of 04/15/2020  Medication Sig  . acetaminophen (TYLENOL) 650 MG CR tablet Take 650 mg by mouth every 8 (eight) hours as needed for pain.   [name] amoxicillin (AMOXIL) 500 MG capsule Take 1 capsule (500 mg total) by mouth 3 (three) times daily.  [name] aspirin EC 81 MG tablet Take 81 mg by mouth daily.  . Baclofen 5 MG TABS Take 5 mg by mouth 3 (three) times daily as needed.  . carbidopa-levodopa (SINEMET IR) 25-100 MG tablet Take 1 tablet by mouth in the morning, at noon, and at bedtime.  . cholecalciferol (VITAMIN D) 1000 units tablet Take 1,000 Units by mouth daily.  . ciprofloxacin (CIPRO) 250 MG tablet Take 1 tablet (250 mg total) by mouth 2 (two) times daily.  . ferrous sulfate 325 (65 FE) MG tablet Take 325 mg by mouth daily with  breakfast.  . levothyroxine (SYNTHROID) 88 MCG tablet TAKE ONE TABLET (88 MCG TOTAL) BY MOUTH DAILY.  . metoprolol succinate (TOPROL-XL) 50 MG 24 hr tablet Take 1 tablet (50 mg total) by mouth daily. Take with or immediately following a meal.  . Multiple Vitamins-Minerals (MULTIVITAMIN WITH MINERALS) tablet Take 1 tablet by mouth daily. Spectra-vite  . omeprazole (PRILOSEC) 40 MG capsule TAKE 1 CAPSULE BY MOUTH EVERY DAY   No facility-administered encounter medications on file as of 04/15/2020.    Goals    .  Client wants to talk with LCSW about her anxiety/stress related to fall potential (pt-stated)      Current Barriers:  . At risk for falls . Decreased appetite . Pain issues faced in client with Chronic Diagnoses of  HTN, GERD, OA, and Atrial Fibrillation . Tremor in her hands  Clinical Social Work Clinical Goal(s):  [name] LCSW will talk with clinet and with [name] in next 4 week to discuss client anxiety or stress related to fall potential  Interventions: . Talked with [name], grandaughte rof clinet about CCM program services . Encouraged [name] or client to call RNCM to discuss nurisng support for client . Talked with [name] abut upcoming client appointments . Talked with [name] about pain issues of client . Talked with [name] about decreased appetite of client . Talked with [name] about ambulation issues of client (client uses a walker as needed) . Talked with [name] about  recent fall of client . Talked with [name] about skin care of client [name] said that client has skin care area that requires dressing every 24 hours. ) . Talked with [name] about memory issues of client  Patient Self Care Activities:  . Attends all scheduled provider appointments  Patient Self Care Deficits . Unable to self administer medications as prescribed . Needs help with ADLs . Need transport help  Initial Care Plan documentation    Follow Up Plan: LCSW to call client [name], granddaughter of  client, in next 4 weeks to talk about anxiety/stress issues of client and to talk about mobility issues of client  [name].Anthon Harpole MSW, LCSW Licensed Clinical Social Worker [facility] Care Management [phone]

## 2020-04-15 NOTE — Patient Instructions (Addendum)
Licensed Clinical Social Worker Visit Information  Goals we discussed today:   .  Client wants to talk with LCSW about her anxiety/stress related to fall potential (pt-stated)        Current Barriers:   At risk for falls  Decreased appetite  Pain issues faced in client with Chronic Diagnoses of  HTN, GERD, OA, and Atrial Fibrillation  Tremor in her hands  Clinical Social Work Clinical Goal(s):   LCSW will talk with clinet and with [name] in next 4 week to discuss client anxiety or stress related to fall potential  Interventions:  Talked with [name], grandaughte rof clinet about CCM program services  Encouraged [name] or client to call RNCM to discuss nurisng support for client  Talked with [name] abut upcoming client appointments  Talked with [name] about pain issues of client  Talked with [name] about decreased appetite of client  Talked with [name] about ambulation issues of client (client uses a walker as needed)  Talked with [name] about recent fall of client  Talked with [name] about skin care of client [name] said that client has skin care area that requires dressing every 24 hours. )  Talked with [name] about memory issues of client  Patient Self Care Activities:   Attends all scheduled provider appointments  Patient Self Care Deficits  Unable to self administer medications as prescribed  Needs help with ADLs  Need transport help  Initial Care Plan documentation    Follow Up Plan: LCSW to call client Elaine Kennedy, granddaughter of client, in next 4 weeks to talk about anxiety/stress issues of clientand to talk about mobility issues of client  Materials Provided: No  The patient/Elaine Kennedy, granddaughter of client, verbalized understanding of instructions provided today and declined a print copy of patient instruction materials.   [name].Brionna Romanek MSW, LCSW Licensed Clinical Social Worker [facility] Care  Management [phone]

## 2020-04-21 DIAGNOSIS — S62616D Displaced fracture of proximal phalanx of right little finger, subsequent encounter for fracture with routine healing: Secondary | ICD-10-CM | POA: Diagnosis not present

## 2020-04-21 DIAGNOSIS — I5033 Acute on chronic diastolic (congestive) heart failure: Secondary | ICD-10-CM | POA: Diagnosis not present

## 2020-04-21 DIAGNOSIS — Z85828 Personal history of other malignant neoplasm of skin: Secondary | ICD-10-CM | POA: Diagnosis not present

## 2020-04-21 DIAGNOSIS — S81811D Laceration without foreign body, right lower leg, subsequent encounter: Secondary | ICD-10-CM | POA: Diagnosis not present

## 2020-04-21 DIAGNOSIS — Z9181 History of falling: Secondary | ICD-10-CM | POA: Diagnosis not present

## 2020-04-21 DIAGNOSIS — D649 Anemia, unspecified: Secondary | ICD-10-CM | POA: Diagnosis not present

## 2020-04-21 DIAGNOSIS — I4821 Permanent atrial fibrillation: Secondary | ICD-10-CM | POA: Diagnosis not present

## 2020-04-21 DIAGNOSIS — S022XXD Fracture of nasal bones, subsequent encounter for fracture with routine healing: Secondary | ICD-10-CM | POA: Diagnosis not present

## 2020-04-21 DIAGNOSIS — I110 Hypertensive heart disease with heart failure: Secondary | ICD-10-CM | POA: Diagnosis not present

## 2020-04-21 DIAGNOSIS — Z8673 Personal history of transient ischemic attack (TIA), and cerebral infarction without residual deficits: Secondary | ICD-10-CM | POA: Diagnosis not present

## 2020-04-21 DIAGNOSIS — M170 Bilateral primary osteoarthritis of knee: Secondary | ICD-10-CM | POA: Diagnosis not present

## 2020-04-21 DIAGNOSIS — I872 Venous insufficiency (chronic) (peripheral): Secondary | ICD-10-CM | POA: Diagnosis not present

## 2020-04-21 DIAGNOSIS — K219 Gastro-esophageal reflux disease without esophagitis: Secondary | ICD-10-CM | POA: Diagnosis not present

## 2020-04-21 DIAGNOSIS — E785 Hyperlipidemia, unspecified: Secondary | ICD-10-CM | POA: Diagnosis not present

## 2020-04-21 DIAGNOSIS — E039 Hypothyroidism, unspecified: Secondary | ICD-10-CM | POA: Diagnosis not present

## 2020-04-23 ENCOUNTER — Ambulatory Visit (INDEPENDENT_AMBULATORY_CARE_PROVIDER_SITE_OTHER): Payer: Medicare Other | Admitting: Family Medicine

## 2020-04-23 ENCOUNTER — Other Ambulatory Visit: Payer: Self-pay

## 2020-04-23 ENCOUNTER — Encounter: Payer: Self-pay | Admitting: Family Medicine

## 2020-04-23 VITALS — BP 144/85 | HR 86 | Temp 97.1°F | Ht 61.0 in | Wt 127.0 lb

## 2020-04-23 DIAGNOSIS — I10 Essential (primary) hypertension: Secondary | ICD-10-CM | POA: Diagnosis not present

## 2020-04-23 DIAGNOSIS — F0391 Unspecified dementia with behavioral disturbance: Secondary | ICD-10-CM

## 2020-04-23 DIAGNOSIS — E039 Hypothyroidism, unspecified: Secondary | ICD-10-CM | POA: Diagnosis not present

## 2020-04-23 DIAGNOSIS — L98491 Non-pressure chronic ulcer of skin of other sites limited to breakdown of skin: Secondary | ICD-10-CM

## 2020-04-23 NOTE — Progress Notes (Signed)
Subjective: CC: Follow-up hypothyroidism, dementia PCP: [name], DO [facility]:Elaine Kennedy is a 86 y.o. female presenting to clinic today for:  1.  Hypothyroidism/ dementia/ debility Patient continues to take her Synthroid 88 mcg daily as directed.  Most of the history is provided by her caregiver.  No change in voice, tremor, bowel habits or weight.  She is getting her up to walk around at least 9-10 laps around the house per day.  [facility] also comes in and does some physical therapy exercises with her upper extremities.  With regards to her dementia, visual and auditory hallucinations have improved greatly with adjustment of her medication by her neurologist.  She did have a fall recently when she was turning and fell on her rear onto some [place].  Her caregiver did have to call EMS to get her up because she is debilitated as well.  The lesions on her left lower extremity seem to be getting better.  Wound care is coming in regularly to address these.  Last dressing was yesterday.   ROS: Per HPI  Allergies  Allergen Reactions  . Sulfa Antibiotics Diarrhea   Past Medical History:  Diagnosis Date  . Arthritis   . Cancer ([place])    skin cancer on head.  [name] Dysrhythmia    AFib  . GERD (gastroesophageal reflux disease)   . Hypothyroidism     Current Outpatient Medications:  .  acetaminophen (TYLENOL) 650 MG CR tablet, Take 650 mg by mouth every 8 (eight) hours as needed for pain. , Disp: , Rfl:  .  amoxicillin (AMOXIL) 500 MG capsule, Take 1 capsule (500 mg total) by mouth 3 (three) times daily., Disp: 21 capsule, Rfl: 0 .  aspirin EC 81 MG tablet, Take 81 mg by mouth daily., Disp: , Rfl:  .  Baclofen 5 MG TABS, Take 5 mg by mouth 3 (three) times daily as needed., Disp: 30 tablet, Rfl: 0 .  carbidopa-levodopa (SINEMET IR) 25-100 MG tablet, Take 1 tablet by mouth in the morning, at noon, and at bedtime., Disp: , Rfl:  .  cholecalciferol (VITAMIN D) 1000 units tablet, Take  1,000 Units by mouth daily., Disp: , Rfl:  .  ciprofloxacin (CIPRO) 250 MG tablet, Take 1 tablet (250 mg total) by mouth 2 (two) times daily., Disp: 10 tablet, Rfl: 0 .  ferrous sulfate 325 (65 FE) MG tablet, Take 325 mg by mouth daily with breakfast., Disp: , Rfl:  .  levothyroxine (SYNTHROID) 88 MCG tablet, TAKE ONE TABLET (88 MCG TOTAL) BY MOUTH DAILY., Disp: 90 tablet, Rfl: 1 .  metoprolol succinate (TOPROL-XL) 50 MG 24 hr tablet, Take 1 tablet (50 mg total) by mouth daily. Take with or immediately following a meal., Disp: 90 tablet, Rfl: 0 .  Multiple Vitamins-Minerals (MULTIVITAMIN WITH MINERALS) tablet, Take 1 tablet by mouth daily. Spectra-vite, Disp: , Rfl:  .  omeprazole (PRILOSEC) 40 MG capsule, TAKE 1 CAPSULE BY MOUTH EVERY DAY, Disp: 90 capsule, Rfl: 0 Social History   Socioeconomic History  . Marital status: Widowed    Spouse name: Not on file  . Number of children: 2  . Years of education: 8  . Highest education level: 8th grade  Occupational History  . Occupation: retired  Tobacco Use  . Smoking status: Never Smoker  . Smokeless tobacco: Never Used  Vaping Use  . Vaping Use: Never used  Substance and Sexual Activity  . Alcohol use: No  . Drug use: No  . Sexual activity: Not  Currently    Birth control/protection: Post-menopausal  Other Topics Concern  . Not on file  Social History Narrative  . Not on file   Social Determinants of Health   Financial Resource Strain: Low Risk   . Difficulty of Paying Living Expenses: Not hard at all  Food Insecurity: No Food Insecurity  . Worried About Charity fundraiser in the Last Year: Never true  . Ran Out of Food in the Last Year: Never true  Transportation Needs: No Transportation Needs  . Lack of Transportation (Medical): No  . Lack of Transportation (Non-Medical): No  Physical Activity: Inactive  . Days of Exercise per Week: 0 days  . Minutes of Exercise per Session: 0 min  Stress: No Stress Concern Present  .  Feeling of Stress : Only a little  Social Connections: Moderately Integrated  . Frequency of Communication with Friends and Family: More than three times a week  . Frequency of Social Gatherings with Friends and Family: More than three times a week  . Attends Religious Services: More than 4 times per year  . Active Member of Clubs or Organizations: Yes  . Attends Archivist Meetings: More than 4 times per year  . Marital Status: Widowed  Intimate Partner Violence: Not At Risk  . Fear of Current or Ex-Partner: No  . Emotionally Abused: No  . Physically Abused: No  . Sexually Abused: No   Family History  Problem Relation Age of Onset  . Cancer Daughter        breast  . Heart murmur Daughter     Objective: Office vital signs reviewed. BP (!) 144/85   Pulse 86   Temp (!) 97.1 F (36.2 C) (Temporal)   Ht 5\' 1"  (1.549 m)   Wt 127 lb (57.6 kg)   SpO2 96%   BMI 24.00 kg/m   Physical Examination:  General: Awake, alert, chronically ill-appearing female, No acute distress HEENT: Normal; no goiter or exophthalmos Cardio: regular rate and rhythm, S1S2 heard, no murmurs appreciated Pulm: clear to auscultation bilaterally, no wheezes, rhonchi or rales; normal work of breathing on room air Extremities: warm, well perfused, No edema, cyanosis or clubbing; +1 pulses bilaterally MSK: Arrives in wheelchair Skin: She has a 1.5 cm x 1.5 cm ulceration noted along the medial aspect of the left lower leg.  There is a blood blister noted on the superior aspect of this that is approximately dime sized. Neuro: [place] but does not directly respond and clear language  Assessment/ Plan: 86 y.o. female   1. Essential hypertension with goal blood pressure less than 130/80 Blood pressure well controlled for age and multiple comorbidities  2. Hypothyroidism, unspecified type Asymptomatic.  Check thyroid - Thyroid Panel With TSH  3. Dementia with behavioral disturbance, unspecified  dementia type ([place]) Stable with reduction in amantadine.  Continue to follow-up with neurology  4. Skin ulcer, limited to breakdown of skin ([place]) Redressed today.  Continue with wound care.   No orders of the defined types were placed in this encounter.  No orders of the defined types were placed in this encounter.    [name], DO [place] [phone]

## 2020-04-24 LAB — THYROID PANEL WITH TSH
Free Thyroxine Index: 2.3 (ref 1.2–4.9)
T3 Uptake Ratio: 32 % (ref 24–39)
T4, Total: 7.2 ug/dL (ref 4.5–12.0)
TSH: 1.21 u[IU]/mL (ref 0.450–4.500)

## 2020-04-25 ENCOUNTER — Telehealth: Payer: Self-pay | Admitting: Family Medicine

## 2020-04-26 NOTE — Telephone Encounter (Signed)
lmtcb

## 2020-04-27 ENCOUNTER — Other Ambulatory Visit: Payer: Self-pay | Admitting: Family Medicine

## 2020-04-27 DIAGNOSIS — Z8679 Personal history of other diseases of the circulatory system: Secondary | ICD-10-CM

## 2020-04-29 DIAGNOSIS — G20 Parkinson's disease: Secondary | ICD-10-CM | POA: Diagnosis not present

## 2020-04-29 DIAGNOSIS — G4710 Hypersomnia, unspecified: Secondary | ICD-10-CM | POA: Diagnosis not present

## 2020-04-29 DIAGNOSIS — R296 Repeated falls: Secondary | ICD-10-CM | POA: Diagnosis not present

## 2020-04-29 DIAGNOSIS — Z79899 Other long term (current) drug therapy: Secondary | ICD-10-CM | POA: Diagnosis not present

## 2020-05-02 DIAGNOSIS — E039 Hypothyroidism, unspecified: Secondary | ICD-10-CM | POA: Diagnosis not present

## 2020-05-02 DIAGNOSIS — I872 Venous insufficiency (chronic) (peripheral): Secondary | ICD-10-CM | POA: Diagnosis not present

## 2020-05-02 DIAGNOSIS — Z8673 Personal history of transient ischemic attack (TIA), and cerebral infarction without residual deficits: Secondary | ICD-10-CM | POA: Diagnosis not present

## 2020-05-02 DIAGNOSIS — S022XXD Fracture of nasal bones, subsequent encounter for fracture with routine healing: Secondary | ICD-10-CM | POA: Diagnosis not present

## 2020-05-02 DIAGNOSIS — M170 Bilateral primary osteoarthritis of knee: Secondary | ICD-10-CM | POA: Diagnosis not present

## 2020-05-02 DIAGNOSIS — D649 Anemia, unspecified: Secondary | ICD-10-CM | POA: Diagnosis not present

## 2020-05-02 DIAGNOSIS — E785 Hyperlipidemia, unspecified: Secondary | ICD-10-CM | POA: Diagnosis not present

## 2020-05-02 DIAGNOSIS — I4821 Permanent atrial fibrillation: Secondary | ICD-10-CM | POA: Diagnosis not present

## 2020-05-02 DIAGNOSIS — Z9181 History of falling: Secondary | ICD-10-CM | POA: Diagnosis not present

## 2020-05-02 DIAGNOSIS — Z85828 Personal history of other malignant neoplasm of skin: Secondary | ICD-10-CM | POA: Diagnosis not present

## 2020-05-02 DIAGNOSIS — I110 Hypertensive heart disease with heart failure: Secondary | ICD-10-CM | POA: Diagnosis not present

## 2020-05-02 DIAGNOSIS — K219 Gastro-esophageal reflux disease without esophagitis: Secondary | ICD-10-CM | POA: Diagnosis not present

## 2020-05-02 DIAGNOSIS — I5033 Acute on chronic diastolic (congestive) heart failure: Secondary | ICD-10-CM | POA: Diagnosis not present

## 2020-05-02 DIAGNOSIS — S62616D Displaced fracture of proximal phalanx of right little finger, subsequent encounter for fracture with routine healing: Secondary | ICD-10-CM | POA: Diagnosis not present

## 2020-05-02 DIAGNOSIS — S81811D Laceration without foreign body, right lower leg, subsequent encounter: Secondary | ICD-10-CM | POA: Diagnosis not present

## 2020-05-03 ENCOUNTER — Telehealth: Payer: Self-pay | Admitting: *Deleted

## 2020-05-03 ENCOUNTER — Ambulatory Visit: Payer: Medicare Other | Admitting: *Deleted

## 2020-05-03 DIAGNOSIS — R404 Transient alteration of awareness: Secondary | ICD-10-CM | POA: Diagnosis not present

## 2020-05-03 DIAGNOSIS — M1990 Unspecified osteoarthritis, unspecified site: Secondary | ICD-10-CM | POA: Diagnosis not present

## 2020-05-03 DIAGNOSIS — G20 Parkinson's disease: Secondary | ICD-10-CM | POA: Diagnosis not present

## 2020-05-03 DIAGNOSIS — I4891 Unspecified atrial fibrillation: Secondary | ICD-10-CM | POA: Diagnosis not present

## 2020-05-03 DIAGNOSIS — I499 Cardiac arrhythmia, unspecified: Secondary | ICD-10-CM | POA: Diagnosis not present

## 2020-05-03 DIAGNOSIS — E785 Hyperlipidemia, unspecified: Secondary | ICD-10-CM | POA: Diagnosis not present

## 2020-05-03 DIAGNOSIS — Z7982 Long term (current) use of aspirin: Secondary | ICD-10-CM | POA: Diagnosis not present

## 2020-05-03 DIAGNOSIS — R000 Tachycardia, unspecified: Secondary | ICD-10-CM | POA: Diagnosis not present

## 2020-05-03 DIAGNOSIS — Z9181 History of falling: Secondary | ICD-10-CM | POA: Diagnosis not present

## 2020-05-03 DIAGNOSIS — R296 Repeated falls: Secondary | ICD-10-CM | POA: Diagnosis not present

## 2020-05-03 DIAGNOSIS — Z7401 Bed confinement status: Secondary | ICD-10-CM | POA: Diagnosis not present

## 2020-05-03 DIAGNOSIS — F0391 Unspecified dementia with behavioral disturbance: Secondary | ICD-10-CM

## 2020-05-03 DIAGNOSIS — M545 Low back pain: Secondary | ICD-10-CM | POA: Diagnosis not present

## 2020-05-03 DIAGNOSIS — I110 Hypertensive heart disease with heart failure: Secondary | ICD-10-CM | POA: Diagnosis not present

## 2020-05-03 DIAGNOSIS — Z515 Encounter for palliative care: Secondary | ICD-10-CM | POA: Diagnosis not present

## 2020-05-03 DIAGNOSIS — R1310 Dysphagia, unspecified: Secondary | ICD-10-CM | POA: Diagnosis not present

## 2020-05-03 DIAGNOSIS — I482 Chronic atrial fibrillation, unspecified: Secondary | ICD-10-CM | POA: Diagnosis not present

## 2020-05-03 DIAGNOSIS — J690 Pneumonitis due to inhalation of food and vomit: Secondary | ICD-10-CM | POA: Diagnosis not present

## 2020-05-03 DIAGNOSIS — W19XXXA Unspecified fall, initial encounter: Secondary | ICD-10-CM | POA: Diagnosis not present

## 2020-05-03 DIAGNOSIS — Z043 Encounter for examination and observation following other accident: Secondary | ICD-10-CM | POA: Diagnosis not present

## 2020-05-03 DIAGNOSIS — S59911A Unspecified injury of right forearm, initial encounter: Secondary | ICD-10-CM | POA: Diagnosis not present

## 2020-05-03 DIAGNOSIS — I509 Heart failure, unspecified: Secondary | ICD-10-CM | POA: Diagnosis not present

## 2020-05-03 DIAGNOSIS — S0181XA Laceration without foreign body of other part of head, initial encounter: Secondary | ICD-10-CM | POA: Diagnosis not present

## 2020-05-03 DIAGNOSIS — R4020 Unspecified coma: Secondary | ICD-10-CM | POA: Diagnosis not present

## 2020-05-03 DIAGNOSIS — S81812A Laceration without foreign body, left lower leg, initial encounter: Secondary | ICD-10-CM | POA: Diagnosis not present

## 2020-05-03 DIAGNOSIS — Z743 Need for continuous supervision: Secondary | ICD-10-CM | POA: Diagnosis not present

## 2020-05-03 DIAGNOSIS — E039 Hypothyroidism, unspecified: Secondary | ICD-10-CM | POA: Diagnosis not present

## 2020-05-03 DIAGNOSIS — Z96642 Presence of left artificial hip joint: Secondary | ICD-10-CM | POA: Diagnosis not present

## 2020-05-03 DIAGNOSIS — R0902 Hypoxemia: Secondary | ICD-10-CM | POA: Diagnosis not present

## 2020-05-03 DIAGNOSIS — Z66 Do not resuscitate: Secondary | ICD-10-CM | POA: Diagnosis not present

## 2020-05-03 DIAGNOSIS — K219 Gastro-esophageal reflux disease without esophagitis: Secondary | ICD-10-CM | POA: Diagnosis not present

## 2020-05-03 NOTE — Chronic Care Management (AMB) (Signed)
  Chronic Care Management   Note  05/03/2020 Name: Elaine Kennedy MRN: [id number] DOB: [birth year]  Incoming call from patient's daughter, Elaine Kennedy. Elaine Kennedy was transferred by EMS to [facility] this morning after falling twice. She sustained a skin tear to her arm and hit her head. Hospital notes reviewed. Per daughter, patient is awaiting admission for pneumonia and lower extremity edema. Advised daughter that transitional care follow-up with PCP office will be needed after discharge and encouraged her to reach out to PCP office within two days of discharge if she has not been contacted. Advised that I will be out of town next week but will plan to follow-up by telephone the following week. I will consult with [facility] clinical staff so that they are aware of admission and will be on the lookout for discharge.   Follow up plan: The care management team will reach out to the patient again over the next 15 days.   Chong Sicilian, BSN, RN-BC Embedded Chronic Care Manager [facility] [place] Management Direct Dial: [phone]

## 2020-05-03 NOTE — Patient Instructions (Signed)
Follow up with PCP office after discharge.  RN care manager will follow-up by telephone within 15 days.  Chong Sicilian, BSN, RN-BC Embedded Chronic Care Manager [facility] [place] Management Direct Dial: [phone]

## 2020-05-03 NOTE — Telephone Encounter (Signed)
05/03/2020   Incoming call form patient's daughter, Elaine Kennedy, today to let me know that she has been transferred by EMS to [facility] and is awaiting admission. She fell twice this morning, hitting her head and sustaining skin tears to her arm and she hit her head. Hospital notes available for review. Per daughter, she was diagnosed with pneumonia and will be treated inpatient. I will be out of the office next week and wanted to alert you to be on the lookout for hospital discharge because she will need a TOC call and visit with provider. I asked Elaine Kennedy to reach out to you if she has not received call within 2 business days of discharge. I will follow up with her by telephone the following week.   Forwarding to [facility] clinical staff as an Pharmacist, hospital.   Chong Sicilian, BSN, RN-BC Embedded Chronic Care Manager [facility] [place] [facility] Direct Dial: [phone]

## 2020-05-04 DIAGNOSIS — I4891 Unspecified atrial fibrillation: Secondary | ICD-10-CM | POA: Diagnosis not present

## 2020-05-04 DIAGNOSIS — R296 Repeated falls: Secondary | ICD-10-CM | POA: Diagnosis not present

## 2020-05-05 DIAGNOSIS — R296 Repeated falls: Secondary | ICD-10-CM | POA: Diagnosis not present

## 2020-05-05 DIAGNOSIS — I4891 Unspecified atrial fibrillation: Secondary | ICD-10-CM | POA: Diagnosis not present

## 2020-05-06 DIAGNOSIS — R1310 Dysphagia, unspecified: Secondary | ICD-10-CM | POA: Diagnosis not present

## 2020-05-06 DIAGNOSIS — G20 Parkinson's disease: Secondary | ICD-10-CM | POA: Diagnosis not present

## 2020-05-06 DIAGNOSIS — I4891 Unspecified atrial fibrillation: Secondary | ICD-10-CM | POA: Diagnosis not present

## 2020-05-07 DIAGNOSIS — R296 Repeated falls: Secondary | ICD-10-CM | POA: Diagnosis not present

## 2020-05-07 DIAGNOSIS — I4891 Unspecified atrial fibrillation: Secondary | ICD-10-CM | POA: Diagnosis not present

## 2020-05-08 DIAGNOSIS — Z7401 Bed confinement status: Secondary | ICD-10-CM | POA: Diagnosis not present

## 2020-05-08 DIAGNOSIS — Z743 Need for continuous supervision: Secondary | ICD-10-CM | POA: Diagnosis not present

## 2020-05-08 DIAGNOSIS — R404 Transient alteration of awareness: Secondary | ICD-10-CM | POA: Diagnosis not present

## 2020-05-08 DIAGNOSIS — R0902 Hypoxemia: Secondary | ICD-10-CM | POA: Diagnosis not present

## 2020-05-08 DIAGNOSIS — I4891 Unspecified atrial fibrillation: Secondary | ICD-10-CM | POA: Diagnosis not present

## 2020-05-08 DIAGNOSIS — R4020 Unspecified coma: Secondary | ICD-10-CM | POA: Diagnosis not present

## 2020-05-16 ENCOUNTER — Ambulatory Visit: Payer: Medicare Other | Admitting: *Deleted

## 2020-05-16 DIAGNOSIS — F0391 Unspecified dementia with behavioral disturbance: Secondary | ICD-10-CM

## 2020-05-16 NOTE — Chronic Care Management (AMB) (Signed)
  Chronic Care Management   Note  05/16/2020 Name: Elaine Kennedy MRN: [id number] DOB: [birth year]  Chart review in preparation for follow-up call indicates that patient is deceased as of 06/03/2020. She presented to [facility] on 05/03/20 s/p fall with head injury and was unresponsive during admission and never regained consciousness.  Follow up plan: Patient is deceased as of 06/03/2020. Forwarding to PCP as an [place]. CCM case closed.   Chong Sicilian, BSN, RN-BC Embedded Chronic Care Manager [facility] [place] [facility] Direct Dial: [phone]

## 2020-05-17 DEATH — deceased

## 2020-05-20 ENCOUNTER — Ambulatory Visit: Payer: Medicare Other | Admitting: Licensed Clinical Social Worker

## 2020-05-20 DIAGNOSIS — F0391 Unspecified dementia with behavioral disturbance: Secondary | ICD-10-CM

## 2020-05-20 DIAGNOSIS — M159 Polyosteoarthritis, unspecified: Secondary | ICD-10-CM

## 2020-05-20 DIAGNOSIS — K219 Gastro-esophageal reflux disease without esophagitis: Secondary | ICD-10-CM

## 2020-05-20 DIAGNOSIS — M150 Primary generalized (osteo)arthritis: Secondary | ICD-10-CM

## 2020-05-20 DIAGNOSIS — Z8679 Personal history of other diseases of the circulatory system: Secondary | ICD-10-CM

## 2020-05-20 DIAGNOSIS — I10 Essential (primary) hypertension: Secondary | ICD-10-CM

## 2020-05-20 NOTE — Patient Instructions (Addendum)
Licensed Clinical Social Worker Visit Information  05/20/2020  Name: Elaine Kennedy           MRN: [id number]       DOB: [birth year]  Elaine Kennedy is a 86 y.o. year old female who was  a primary care patient of Elaine Norlander, DO. The CCM team was consulted for assistance with Elaine Kennedy .   Review of patient status, including review of consultants reports, other relevant assessments, and collaboration with appropriate care team members and the patient's provider was performed as part of comprehensive patient evaluation and provision of chronic care management services.    SDOH (Social Determinants of Health) assessments performed: No;risk for depression; risk for physical inactivity; risk for stress; risk for financial strain; risk for social isolation  LCSW completed chart review on client on 05/20/2020. Elaine Sicilian, RN had completed chart review earlier in Epic notes and Elaine Sicilian RN documented in Epic that client was deceased as of May 13, 2020.  Since client died on 13-May-2020, LCSW will close social work services/will close client case on 05/20/2020.  Materials Provided: No  Elaine Kennedy MSW, LCSW Licensed Clinical Social Worker [facility] Care Management [phone]

## 2020-05-20 NOTE — Chronic Care Management (AMB) (Signed)
  Chronic Care Management    Clinical Social Work Follow Up Note  05/20/2020 Name: Elaine Kennedy MRN: [id number] DOB: [birth year]  Elaine Kennedy is a 86 y.o. year old female who was  a primary care patient of [name], DO. The CCM team was consulted for assistance with [name] .   Review of patient status, including review of consultants reports, other relevant assessments, and collaboration with appropriate care team members and the patient's provider was performed as part of comprehensive patient evaluation and provision of chronic care management services.    SDOH (Social Determinants of Health) assessments performed: No;risk for depression; risk for physical inactivity; risk for stress; risk for financial strain; risk for social isolation    Clinical Support from 11/02/2019 in [facility]  PHQ-9 Total Score 7     GAD 7 : Generalized Anxiety Score 09/08/2019  Nervous, Anxious, on Edge 1  Control/stop worrying 1  Worry too much - different things 1  Trouble relaxing 1  Restless 0  Easily annoyed or irritable 0  Afraid - awful might happen 1  Total GAD 7 Score 5  Anxiety Difficulty Somewhat difficult    Outpatient Encounter Medications as of 05/20/2020  Medication Sig  . acetaminophen (TYLENOL) 650 MG CR tablet Take 650 mg by mouth every 8 (eight) hours as needed for pain.   [name] amantadine (SYMMETREL) 100 MG capsule Take 100 mg by mouth daily.   [name] aspirin EC 81 MG tablet Take 81 mg by mouth daily.  . Baclofen 5 MG TABS Take 5 mg by mouth 3 (three) times daily as needed. (Patient not taking: Reported on 04/23/2020)  . carbidopa-levodopa (SINEMET IR) 25-100 MG tablet Take 1 tablet by mouth in the morning, at noon, and at bedtime.  . cholecalciferol (VITAMIN D) 1000 units tablet Take 1,000 Units by mouth daily.  . ferrous sulfate 325 (65 FE) MG tablet Take 325 mg by mouth daily with breakfast.  . levothyroxine (SYNTHROID) 88 MCG tablet TAKE ONE  TABLET (88 MCG TOTAL) BY MOUTH DAILY.  . metoprolol succinate (TOPROL-XL) 50 MG 24 hr tablet TAKE 1 TABLET (50 MG TOTAL) BY MOUTH DAILY. TAKE WITH OR IMMEDIATELY FOLLOWING A MEAL.  . Multiple Vitamins-Minerals (MULTIVITAMIN WITH MINERALS) tablet Take 1 tablet by mouth daily. Spectra-vite  . omeprazole (PRILOSEC) 40 MG capsule TAKE 1 CAPSULE BY MOUTH EVERY DAY   No facility-administered encounter medications on file as of 05/20/2020.   LCSW completed chart review on client on 05/20/2020. Chong Sicilian, RN had completed chart review earlier in Epic notes and Chong Sicilian RN documented in Epic that client was deceased as of May 19, 2020.  Since client died on 2020/05/19, LCSW will close social work services/will close client case on 05/20/2020.  [name].Karyna Bessler MSW, LCSW Licensed Clinical Social Worker [facility] [phone]

## 2020-06-04 ENCOUNTER — Other Ambulatory Visit (HOSPITAL_COMMUNITY): Payer: Self-pay | Admitting: Neurology

## 2020-06-04 ENCOUNTER — Other Ambulatory Visit: Payer: Self-pay | Admitting: Neurology

## 2020-06-04 DIAGNOSIS — R4182 Altered mental status, unspecified: Secondary | ICD-10-CM

## 2020-06-26 ENCOUNTER — Telehealth: Payer: Self-pay

## 2020-08-23 ENCOUNTER — Ambulatory Visit: Payer: Medicare Other | Admitting: Family Medicine

## 2021-04-23 ENCOUNTER — Telehealth: Payer: Self-pay | Admitting: Family Medicine

## 2021-04-23 NOTE — Telephone Encounter (Signed)
Left message for patient to call back and schedule Medicare Annual Wellness Visit (AWV) either virtually or in office. I gave both office and my number [phone]  Last AWV I 11/02/19  please schedule at anytime with health coach  This should be a 45 minute visit.
